# Patient Record
Sex: Female | Born: 1940 | Race: White | Hispanic: No | Marital: Single | Smoking: Never smoker
Health system: Southern US, Community
[De-identification: ages and names within clinical notes are randomized; demographics above are authoritative.]

## PROBLEM LIST (undated history)

## (undated) DIAGNOSIS — M1990 Unspecified osteoarthritis, unspecified site: Secondary | ICD-10-CM

## (undated) DIAGNOSIS — I2510 Atherosclerotic heart disease of native coronary artery without angina pectoris: Secondary | ICD-10-CM

## (undated) DIAGNOSIS — J302 Other seasonal allergic rhinitis: Secondary | ICD-10-CM

## (undated) DIAGNOSIS — I10 Essential (primary) hypertension: Secondary | ICD-10-CM

## (undated) DIAGNOSIS — R569 Unspecified convulsions: Secondary | ICD-10-CM

## (undated) DIAGNOSIS — K219 Gastro-esophageal reflux disease without esophagitis: Secondary | ICD-10-CM

## (undated) DIAGNOSIS — A491 Streptococcal infection, unspecified site: Secondary | ICD-10-CM

## (undated) DIAGNOSIS — Z8719 Personal history of other diseases of the digestive system: Secondary | ICD-10-CM

## (undated) DIAGNOSIS — F79 Unspecified intellectual disabilities: Secondary | ICD-10-CM

## (undated) HISTORY — DX: Other seasonal allergic rhinitis: J30.2

## (undated) HISTORY — DX: Streptococcal infection, unspecified site: A49.1

## (undated) HISTORY — DX: Personal history of other diseases of the digestive system: Z87.19

## (undated) HISTORY — PX: CHOLECYSTECTOMY: SHX55

---

## 2004-08-03 ENCOUNTER — Emergency Department: Payer: Self-pay | Admitting: Emergency Medicine

## 2004-08-03 ENCOUNTER — Other Ambulatory Visit: Payer: Self-pay

## 2005-05-21 ENCOUNTER — Emergency Department: Payer: Self-pay | Admitting: Emergency Medicine

## 2005-05-21 ENCOUNTER — Other Ambulatory Visit: Payer: Self-pay

## 2005-08-15 ENCOUNTER — Other Ambulatory Visit: Payer: Self-pay

## 2005-08-15 ENCOUNTER — Emergency Department: Payer: Self-pay | Admitting: Emergency Medicine

## 2006-09-19 ENCOUNTER — Emergency Department: Payer: Self-pay | Admitting: General Practice

## 2007-01-16 ENCOUNTER — Ambulatory Visit: Payer: Self-pay | Admitting: Gastroenterology

## 2008-06-14 ENCOUNTER — Inpatient Hospital Stay: Payer: Self-pay | Admitting: Internal Medicine

## 2008-06-14 ENCOUNTER — Other Ambulatory Visit: Payer: Self-pay

## 2008-06-15 ENCOUNTER — Other Ambulatory Visit: Payer: Self-pay

## 2009-01-17 ENCOUNTER — Emergency Department: Payer: Self-pay | Admitting: Emergency Medicine

## 2009-03-11 ENCOUNTER — Ambulatory Visit: Payer: Self-pay | Admitting: Internal Medicine

## 2009-03-25 ENCOUNTER — Encounter: Payer: Self-pay | Admitting: Internal Medicine

## 2009-04-17 ENCOUNTER — Encounter: Payer: Self-pay | Admitting: Internal Medicine

## 2009-05-18 ENCOUNTER — Encounter: Payer: Self-pay | Admitting: Internal Medicine

## 2013-01-23 ENCOUNTER — Emergency Department: Payer: Self-pay | Admitting: Unknown Physician Specialty

## 2013-01-23 LAB — BASIC METABOLIC PANEL
Anion Gap: 5 — ABNORMAL LOW (ref 7–16)
BUN: 19 mg/dL — ABNORMAL HIGH (ref 7–18)
Chloride: 100 mmol/L (ref 98–107)
Creatinine: 0.71 mg/dL (ref 0.60–1.30)
EGFR (African American): >60

## 2013-01-23 LAB — CBC WITH DIFFERENTIAL/PLATELET
Basophil #: 0.1 10*3/uL (ref 0.0–0.1)
Basophil %: 0.8 %
Eosinophil #: 0.1 10*3/uL (ref 0.0–0.7)
Eosinophil %: 1.6 %
HGB: 12.5 g/dL (ref 12.0–16.0)
Lymphocyte #: 2.5 10*3/uL (ref 1.0–3.6)
Lymphocyte %: 31.8 %
MCV: 88 fL (ref 80–100)
Monocyte %: 8.2 %
Platelet: 212 10*3/uL (ref 150–440)
RBC: 4.23 10*6/uL (ref 3.80–5.20)
RDW: 14.6 % — ABNORMAL HIGH (ref 11.5–14.5)

## 2013-01-23 LAB — PHENYTOIN LEVEL, TOTAL: Dilantin: 21.3 ug/mL — ABNORMAL HIGH (ref 10.0–20.0)

## 2013-01-23 LAB — HEPATIC FUNCTION PANEL A (ARMC)
Bilirubin, Direct: <0.1 mg/dL (ref 0.00–0.20)
SGOT(AST): 16 U/L (ref 15–37)

## 2013-01-23 LAB — PHENOBARBITAL LEVEL: Phenobarbital: 15.1 ug/mL (ref 15.0–40.0)

## 2013-07-30 ENCOUNTER — Emergency Department: Payer: Self-pay | Admitting: Emergency Medicine

## 2013-07-30 LAB — CBC WITH DIFFERENTIAL/PLATELET
Basophil #: 0 10*3/uL (ref 0.0–0.1)
HCT: 37.1 % (ref 35.0–47.0)
Lymphocyte #: 1.9 10*3/uL (ref 1.0–3.6)
MCH: 30.3 pg (ref 26.0–34.0)
MCHC: 33.7 g/dL (ref 32.0–36.0)
Neutrophil #: 4 10*3/uL (ref 1.4–6.5)
Neutrophil %: 61.3 %
WBC: 6.6 10*3/uL (ref 3.6–11.0)

## 2013-07-30 LAB — PHENYTOIN LEVEL, TOTAL: Dilantin: 16.7 ug/mL (ref 10.0–20.0)

## 2013-07-30 LAB — BASIC METABOLIC PANEL
Calcium, Total: 8.8 mg/dL (ref 8.5–10.1)
Chloride: 101 mmol/L (ref 98–107)
Creatinine: 0.65 mg/dL (ref 0.60–1.30)
EGFR (Non-African Amer.): >60
Glucose: 87 mg/dL (ref 65–99)
Potassium: 3.7 mmol/L (ref 3.5–5.1)

## 2014-06-06 DIAGNOSIS — D510 Vitamin B12 deficiency anemia due to intrinsic factor deficiency: Secondary | ICD-10-CM | POA: Insufficient documentation

## 2014-06-06 DIAGNOSIS — R609 Edema, unspecified: Secondary | ICD-10-CM | POA: Insufficient documentation

## 2014-06-06 DIAGNOSIS — E785 Hyperlipidemia, unspecified: Secondary | ICD-10-CM | POA: Insufficient documentation

## 2014-06-24 DIAGNOSIS — R9431 Abnormal electrocardiogram [ECG] [EKG]: Secondary | ICD-10-CM | POA: Insufficient documentation

## 2014-07-13 ENCOUNTER — Emergency Department: Payer: Self-pay | Admitting: Emergency Medicine

## 2014-07-13 LAB — CBC
HCT: 39.6 % (ref 35.0–47.0)
HGB: 12.4 g/dL (ref 12.0–16.0)
MCH: 28.3 pg (ref 26.0–34.0)
MCHC: 31.2 g/dL — ABNORMAL LOW (ref 32.0–36.0)
MCV: 91 fL (ref 80–100)
PLATELETS: 196 10*3/uL (ref 150–440)
RBC: 4.37 10*6/uL (ref 3.80–5.20)
RDW: 14 % (ref 11.5–14.5)
WBC: 15.1 10*3/uL — AB (ref 3.6–11.0)

## 2014-07-13 LAB — URINALYSIS, COMPLETE
Blood: NEGATIVE
Glucose,UR: NEGATIVE mg/dL (ref 0–75)
Ketone: NEGATIVE
NITRITE: NEGATIVE
Ph: 5 (ref 4.5–8.0)
Protein: NEGATIVE
RBC,UR: 1 /HPF (ref 0–5)
SPECIFIC GRAVITY: 1.018 (ref 1.003–1.030)
Squamous Epithelial: 2
WBC UR: 25 /HPF (ref 0–5)

## 2014-07-13 LAB — BASIC METABOLIC PANEL
ANION GAP: 9 (ref 7–16)
BUN: 23 mg/dL — AB (ref 7–18)
CREATININE: 1.17 mg/dL (ref 0.60–1.30)
Calcium, Total: 8.2 mg/dL — ABNORMAL LOW (ref 8.5–10.1)
Chloride: 102 mmol/L (ref 98–107)
Co2: 30 mmol/L (ref 21–32)
EGFR (African American): 59 — ABNORMAL LOW
EGFR (Non-African Amer.): 48 — ABNORMAL LOW
GLUCOSE: 127 mg/dL — AB (ref 65–99)
Osmolality: 287 (ref 275–301)
POTASSIUM: 3.6 mmol/L (ref 3.5–5.1)
Sodium: 141 mmol/L (ref 136–145)

## 2015-05-06 ENCOUNTER — Emergency Department: Payer: Medicare Other

## 2015-05-06 ENCOUNTER — Other Ambulatory Visit: Payer: Self-pay

## 2015-05-06 ENCOUNTER — Encounter: Payer: Self-pay | Admitting: Internal Medicine

## 2015-05-06 ENCOUNTER — Inpatient Hospital Stay
Admission: EM | Admit: 2015-05-06 | Discharge: 2015-05-09 | DRG: 193 | Disposition: A | Discharge status: Skilled Nursing Facility | Payer: Medicare Other | Attending: Internal Medicine | Admitting: Internal Medicine

## 2015-05-06 DIAGNOSIS — J9601 Acute respiratory failure with hypoxia: Secondary | ICD-10-CM | POA: Diagnosis present

## 2015-05-06 DIAGNOSIS — Z791 Long term (current) use of non-steroidal anti-inflammatories (NSAID): Secondary | ICD-10-CM | POA: Diagnosis not present

## 2015-05-06 DIAGNOSIS — Z823 Family history of stroke: Secondary | ICD-10-CM

## 2015-05-06 DIAGNOSIS — I2510 Atherosclerotic heart disease of native coronary artery without angina pectoris: Secondary | ICD-10-CM | POA: Diagnosis present

## 2015-05-06 DIAGNOSIS — I10 Essential (primary) hypertension: Secondary | ICD-10-CM | POA: Diagnosis present

## 2015-05-06 DIAGNOSIS — J189 Pneumonia, unspecified organism: Secondary | ICD-10-CM

## 2015-05-06 DIAGNOSIS — Z888 Allergy status to other drugs, medicaments and biological substances status: Secondary | ICD-10-CM | POA: Diagnosis not present

## 2015-05-06 DIAGNOSIS — Z833 Family history of diabetes mellitus: Secondary | ICD-10-CM | POA: Diagnosis not present

## 2015-05-06 DIAGNOSIS — E876 Hypokalemia: Secondary | ICD-10-CM | POA: Diagnosis not present

## 2015-05-06 DIAGNOSIS — K219 Gastro-esophageal reflux disease without esophagitis: Secondary | ICD-10-CM | POA: Diagnosis present

## 2015-05-06 DIAGNOSIS — F79 Unspecified intellectual disabilities: Secondary | ICD-10-CM | POA: Diagnosis present

## 2015-05-06 DIAGNOSIS — Z8249 Family history of ischemic heart disease and other diseases of the circulatory system: Secondary | ICD-10-CM | POA: Diagnosis not present

## 2015-05-06 DIAGNOSIS — Z9049 Acquired absence of other specified parts of digestive tract: Secondary | ICD-10-CM | POA: Diagnosis present

## 2015-05-06 DIAGNOSIS — M1990 Unspecified osteoarthritis, unspecified site: Secondary | ICD-10-CM | POA: Diagnosis present

## 2015-05-06 DIAGNOSIS — Z7982 Long term (current) use of aspirin: Secondary | ICD-10-CM | POA: Diagnosis not present

## 2015-05-06 DIAGNOSIS — I5041 Acute combined systolic (congestive) and diastolic (congestive) heart failure: Secondary | ICD-10-CM | POA: Diagnosis present

## 2015-05-06 DIAGNOSIS — G40909 Epilepsy, unspecified, not intractable, without status epilepticus: Secondary | ICD-10-CM | POA: Diagnosis present

## 2015-05-06 DIAGNOSIS — R0902 Hypoxemia: Secondary | ICD-10-CM

## 2015-05-06 DIAGNOSIS — Y95 Nosocomial condition: Secondary | ICD-10-CM | POA: Diagnosis present

## 2015-05-06 DIAGNOSIS — I340 Nonrheumatic mitral (valve) insufficiency: Secondary | ICD-10-CM | POA: Diagnosis not present

## 2015-05-06 HISTORY — DX: Atherosclerotic heart disease of native coronary artery without angina pectoris: I25.10

## 2015-05-06 HISTORY — DX: Unspecified convulsions: R56.9

## 2015-05-06 HISTORY — DX: Unspecified osteoarthritis, unspecified site: M19.90

## 2015-05-06 HISTORY — DX: Unspecified intellectual disabilities: F79

## 2015-05-06 HISTORY — DX: Essential (primary) hypertension: I10

## 2015-05-06 HISTORY — DX: Gastro-esophageal reflux disease without esophagitis: K21.9

## 2015-05-06 LAB — COMPREHENSIVE METABOLIC PANEL
ALBUMIN: 2.5 g/dL — AB (ref 3.5–5.0)
ALK PHOS: 132 U/L — AB (ref 38–126)
ALT: 12 U/L — AB (ref 14–54)
ANION GAP: 9 (ref 5–15)
AST: 25 U/L (ref 15–41)
BUN: 35 mg/dL — AB (ref 6–20)
CALCIUM: 8.5 mg/dL — AB (ref 8.9–10.3)
CO2: 28 mmol/L (ref 22–32)
Chloride: 97 mmol/L — ABNORMAL LOW (ref 101–111)
Creatinine, Ser: 1.09 mg/dL — ABNORMAL HIGH (ref 0.44–1.00)
GFR calc Af Amer: 57 mL/min — ABNORMAL LOW (ref >60)
GFR calc non Af Amer: 49 mL/min — ABNORMAL LOW (ref >60)
GLUCOSE: 130 mg/dL — AB (ref 65–99)
Potassium: 3.4 mmol/L — ABNORMAL LOW (ref 3.5–5.1)
SODIUM: 134 mmol/L — AB (ref 135–145)
Total Bilirubin: 0.2 mg/dL — ABNORMAL LOW (ref 0.3–1.2)
Total Protein: 5.7 g/dL — ABNORMAL LOW (ref 6.5–8.1)

## 2015-05-06 LAB — CBC
HEMATOCRIT: 32.2 % — AB (ref 35.0–47.0)
HEMOGLOBIN: 10.5 g/dL — AB (ref 12.0–16.0)
MCH: 28 pg (ref 26.0–34.0)
MCHC: 32.5 g/dL (ref 32.0–36.0)
MCV: 86.2 fL (ref 80.0–100.0)
Platelets: 214 10*3/uL (ref 150–440)
RBC: 3.73 MIL/uL — AB (ref 3.80–5.20)
RDW: 15.2 % — ABNORMAL HIGH (ref 11.5–14.5)
WBC: 19 10*3/uL — AB (ref 3.6–11.0)

## 2015-05-06 MED ORDER — PHENYTOIN SODIUM EXTENDED 30 MG PO CAPS: 260.0000 mg | ORAL_CAPSULE | Freq: Every day | ORAL | Status: DC

## 2015-05-06 MED ORDER — PANTOPRAZOLE SODIUM 40 MG PO TBEC
40.0000 mg | DELAYED_RELEASE_TABLET | Freq: Every day | ORAL | Status: DC
  Administered 2015-05-07 – 2015-05-09 (×3): 40 mg via ORAL
  Filled 2015-05-06 (×3): qty 1

## 2015-05-06 MED ORDER — SODIUM CHLORIDE 0.9 % IV SOLN
1000.0000 mL | Freq: Once | INTRAVENOUS | Status: AC
  Administered 2015-05-06: 1000 mL via INTRAVENOUS

## 2015-05-06 MED ORDER — ASPIRIN EC 81 MG PO TBEC
81.0000 mg | DELAYED_RELEASE_TABLET | Freq: Every day | ORAL | Status: DC
  Administered 2015-05-07 – 2015-05-09 (×3): 81 mg via ORAL
  Filled 2015-05-06 (×3): qty 1

## 2015-05-06 MED ORDER — ACETAMINOPHEN 325 MG PO TABS: 650.0000 mg | ORAL_TABLET | Freq: Four times a day (QID) | ORAL | Status: DC | PRN

## 2015-05-06 MED ORDER — VANCOMYCIN HCL IN DEXTROSE 1-5 GM/200ML-% IV SOLN
1000.0000 mg | INTRAVENOUS | Status: DC
  Administered 2015-05-07 – 2015-05-08 (×2): 1000 mg via INTRAVENOUS
  Filled 2015-05-06 (×4): qty 200

## 2015-05-06 MED ORDER — ONDANSETRON HCL 4 MG/2ML IJ SOLN: 4.0000 mg | Freq: Four times a day (QID) | INTRAMUSCULAR | Status: DC | PRN

## 2015-05-06 MED ORDER — PHENOBARBITAL 20 MG/5ML PO ELIX
60.0000 mg | ORAL_SOLUTION | Freq: Every day | ORAL | Status: DC
  Administered 2015-05-07 (×2): 60 mg via ORAL
  Filled 2015-05-06 (×6): qty 15

## 2015-05-06 MED ORDER — ONDANSETRON HCL 4 MG PO TABS: 4.0000 mg | ORAL_TABLET | Freq: Four times a day (QID) | ORAL | Status: DC | PRN

## 2015-05-06 MED ORDER — TRAMADOL HCL 50 MG PO TABS
50.0000 mg | ORAL_TABLET | Freq: Four times a day (QID) | ORAL | Status: DC | PRN
  Administered 2015-05-08 – 2015-05-09 (×3): 50 mg via ORAL
  Filled 2015-05-06 (×3): qty 1

## 2015-05-06 MED ORDER — LEVOFLOXACIN IN D5W 750 MG/150ML IV SOLN
750.0000 mg | INTRAVENOUS | Status: DC
  Filled 2015-05-06: qty 150

## 2015-05-06 MED ORDER — ACETAMINOPHEN 650 MG RE SUPP: 650.0000 mg | Freq: Four times a day (QID) | RECTAL | Status: DC | PRN

## 2015-05-06 MED ORDER — VANCOMYCIN HCL IN DEXTROSE 1-5 GM/200ML-% IV SOLN
1000.0000 mg | Freq: Once | INTRAVENOUS | Status: AC
  Administered 2015-05-07: 1000 mg via INTRAVENOUS
  Filled 2015-05-06: qty 200

## 2015-05-06 MED ORDER — SODIUM CHLORIDE 0.9 % IJ SOLN
3.0000 mL | Freq: Two times a day (BID) | INTRAMUSCULAR | Status: DC
  Administered 2015-05-07 – 2015-05-08 (×3): 3 mL via INTRAVENOUS

## 2015-05-06 MED ORDER — ENOXAPARIN SODIUM 40 MG/0.4ML ~~LOC~~ SOLN
40.0000 mg | Freq: Every day | SUBCUTANEOUS | Status: DC
  Administered 2015-05-07 – 2015-05-09 (×3): 40 mg via SUBCUTANEOUS
  Filled 2015-05-06 (×3): qty 0.4

## 2015-05-06 MED ORDER — LEVOFLOXACIN IN D5W 750 MG/150ML IV SOLN
750.0000 mg | Freq: Once | INTRAVENOUS | Status: AC
  Administered 2015-05-06: 750 mg via INTRAVENOUS
  Filled 2015-05-06: qty 150

## 2015-05-06 MED ORDER — SODIUM CHLORIDE 0.9 % IV SOLN
INTRAVENOUS | Status: DC
  Administered 2015-05-07: 01:00:00 via INTRAVENOUS

## 2015-05-06 MED ORDER — GABAPENTIN 600 MG PO TABS
600.0000 mg | ORAL_TABLET | Freq: Four times a day (QID) | ORAL | Status: DC
  Administered 2015-05-07 – 2015-05-09 (×10): 600 mg via ORAL
  Filled 2015-05-06 (×10): qty 1

## 2015-05-06 NOTE — ED Provider Notes (Signed)
[facility] Emergency Department Provider Note  ____________________________________________  Time seen: 9 PM  I have reviewed the triage vital signs and the nursing notes.   HISTORY  Chief Complaint Weakness    HPI Hannah Vaughan is a 74 y.o. female who was apparently diagnosed with pneumonia at her primary care physician today she was started on by mouth Levaquin. However nurse who checked on her at her group home noted her to be hypoxic and referred her to the emergency department. Patient is mute hence history is somewhat limited. But apparently she has been coughing. No fevers are reported.     No past medical history on file.  There are no active problems to display for this patient.   No past surgical history on file.  No current outpatient prescriptions on file.  Allergies Depakote  No family history on file.  Social History No smoking, no alcohol  Review of Systems  Constitutional: Negative for fever.   Respiratory: Positive for shortness of breath, negative for cough Gastrointestinal: Negative for vomiting and diarrhea vomiting and diarrhea.  Skin: Negative for rash. Neurological: Negative for focal weakness   Level V caveat ROS limited secondary to nonverbal and is provided mostly by caregivers  ____________________________________________   PHYSICAL EXAM:  VITAL SIGNS: ED Triage Vitals  Enc Vitals Group     BP 05/06/15 1930 112/74 mmHg     Pulse Rate 05/06/15 1930 78     Resp 05/06/15 1930 16     Temp 05/06/15 1930 98.2 F (36.8 C)     Temp Source 05/06/15 1930 Oral     SpO2 05/06/15 1930 89 %     Weight 05/06/15 1930 145 lb (65.772 kg)     Height 05/06/15 1930  (1.499 m)     Head Cir --      Peak Flow --      Pain Score --      Pain Loc --      Pain Edu? --      Excl. in GC? --      Constitutional: No acute distress Eyes: Conjunctivae are normal.  ENT   Head: Normocephalic and atraumatic.    Mouth/Throat: Mucous membranes are moist. Cardiovascular: Normal rate, regular rhythm. Normal and symmetric distal pulses are present in all extremities. No murmurs, rubs, or gallops. Respiratory: Mild tachypnea. Crackles bibasilarly Gastrointestinal: Soft and non-tender in all quadrants. No distention. There is no CVA tenderness. Genitourinary: deferred Musculoskeletal: Nontender with normal range of motion in all extremities. 1+ edema bilaterally Neurologic:  No gross focal neurologic deficits are appreciated. Skin:  Skin is warm, dry and intact. No rash noted.   ____________________________________________    LABS (pertinent positives/negatives)  Labs Reviewed  CBC - Abnormal; Notable for the following:    WBC 19.0 (*)    RBC 3.73 (*)    Hemoglobin 10.5 (*)    HCT 32.2 (*)    RDW 15.2 (*)    All other components within normal limits  COMPREHENSIVE METABOLIC PANEL - Abnormal; Notable for the following:    Sodium 134 (*)    Potassium 3.4 (*)    Chloride 97 (*)    Glucose, Bld 130 (*)    BUN 35 (*)    Creatinine, Ser 1.09 (*)    Calcium 8.5 (*)    Total Protein 5.7 (*)    Albumin 2.5 (*)    ALT 12 (*)    Alkaline Phosphatase 132 (*)    Total Bilirubin  0.2 (*)    GFR calc non Af Amer 49 (*)    GFR calc Af Amer 57 (*)    All other components within normal limits  CULTURE, BLOOD (ROUTINE X 2)  CULTURE, BLOOD (ROUTINE X 2)  URINALYSIS COMPLETEWITH MICROSCOPIC ([facility] ONLY)  BRAIN NATRIURETIC PEPTIDE  CBG MONITORING, ED    ____________________________________________   EKG  ED ECG REPORT I, Jene Every, the attending physician, personally viewed and interpreted this ECG.  Date: 05/06/2015 EKG Time: 7:44 PM Rate: 71 Rhythm: normal sinus rhythm QRS Axis: normal Intervals: normal ST/T Wave abnormalities: normal Conduction Disutrbances: none Narrative Interpretation: unremarkable   ____________________________________________    RADIOLOGY I have  personally reviewed any xrays that were ordered on this patient: Chest x-ray shows infiltrate versus edema bilaterally  ____________________________________________   PROCEDURES  Procedure(s) performed: none  Critical Care performed: none  ____________________________________________   INITIAL IMPRESSION / ASSESSMENT AND PLAN / ED COURSE  Pertinent labs & imaging results that were available during my care of the patient were reviewed by me and considered in my medical decision making (see chart for details).  Patient hypoxic with room air sat of 89%. Bilateral infiltrates and elevated white blood cell count so I will give her IV Levaquin for possible pneumonia and sent blood cultures. I will also add on a BNP given this may be edema. Will require admission for further workup  ____________________________________________   FINAL CLINICAL IMPRESSION(S) / ED DIAGNOSES  Final diagnoses:  Community acquired pneumonia     Jene Every, MD 05/06/15 2159

## 2015-05-06 NOTE — H&P (Signed)
[facility] - [place] at [facility]   PATIENT NAME: Hannah Vaughan    MR#:  [id number]  DATE OF BIRTH:  Mar 17, 1941  DATE OF ADMISSION:  05/06/2015  PRIMARY CARE PHYSICIAN: Hannah Vaughan., MD   REQUESTING/REFERRING PHYSICIAN: Cyril Vaughan, M.D.  CHIEF COMPLAINT:   Chief Complaint  Patient presents with  . Weakness    HISTORY OF PRESENT ILLNESS:  Hannah Vaughan  is a 74 y.o. female who presents with increased confusion, lethargy, cough. Patient is a resident of a local group home, and has been having decreased by mouth intake over the last couple of days. She's also been more steady on her feet, more lethargic. She was taken to her primary care physician for evaluation, and had a chest x-ray done which showed a likely pneumonia. She was given outpatient oral antibiotics, and told that if she got any worse that she should be taken to emergency department. Once back in a group home she began acting more lethargic, and according to her caregivers were present with her in the ED today, she was acting as if she might of been in pain. They brought her to the ED for evaluation, where she was found to be hypoxic to the high 80s. Evaluation here showed an elevated white count and repeat chest x-ray showed likely pneumonia versus congestive heart failure. Hospitalists were called for admission for pneumonia.  PAST MEDICAL HISTORY:   Past Medical History  Diagnosis Date  . HTN (hypertension)   . Seizure   . CAD (coronary artery disease)   . Osteoarthritis   . GERD (gastroesophageal reflux disease)     PAST SURGICAL HISTORY:   Past Surgical History  Procedure Laterality Date  . Cholecystectomy      SOCIAL HISTORY:   Social History  Substance Use Topics  . Smoking status: Never Smoker   . Smokeless tobacco: Not on file  . Alcohol Use: No    FAMILY HISTORY:   Family History  Problem Relation Age of Onset  . CAD Mother   . Congestive Heart Failure Mother   .  CVA Father   . Heart attack Brother   . Hypertension Sister   . Diabetes Sister     DRUG ALLERGIES:   Allergies  Allergen Reactions  . Depakote [Valproic Acid]   . Divalproex Sodium Other (See Comments)    MEDICATIONS AT HOME:   Prior to Admission medications   Medication Sig Start Date End Date Taking? Authorizing Provider  acetaminophen (TYLENOL) 325 MG tablet Take 1 tablet by mouth every 6 (six) hours.   Yes Historical Provider, MD  amLODipine (NORVASC) 2.5 MG tablet Take 1 tablet by mouth every morning.   Yes Historical Provider, MD  aspirin EC 81 MG tablet Take 1 tablet by mouth daily. 11/11/14  Yes Historical Provider, MD  calcium-vitamin D (CALCIUM 500/D) 500-200 MG-UNIT per tablet Take 1 tablet by mouth daily.   Yes Historical Provider, MD  cetirizine (ZYRTEC) 10 MG tablet Take 1 tablet by mouth daily as needed. 03/25/14  Yes Historical Provider, MD  cyanocobalamin (,VITAMIN B-12,) 1000 MCG/ML injection Inject 1 mL into the muscle every 30 (thirty) days.   Yes Historical Provider, MD  docusate sodium (STOOL SOFTENER) 100 MG capsule Take 1 capsule by mouth 2 (two) times daily.   Yes Historical Provider, MD  etodolac (LODINE) 400 MG tablet Take 1 tablet by mouth 2 (two) times daily.   Yes Historical Provider, MD  gabapentin (NEURONTIN) 600 MG tablet Take 1 tablet  by mouth 4 (four) times daily. 06/11/14  Yes Historical Provider, MD  hydrochlorothiazide (HYDRODIURIL) 25 MG tablet Take 1 tablet by mouth daily.   Yes Historical Provider, MD  hydrocortisone (ANUSOL-HC) 25 MG suppository Place 25 mg rectally 2 (two) times daily as needed for hemorrhoids or itching.   Yes Historical Provider, MD  isosorbide mononitrate (IMDUR) 60 MG 24 hr tablet Take 1 tablet by mouth daily. 02/11/14  Yes Historical Provider, MD  magnesium hydroxide (MILK OF MAGNESIA) 400 MG/5ML suspension Take 5 mLs by mouth as needed.   Yes Historical Provider, MD  metoprolol succinate (TOPROL-XL) 25 MG 24 hr tablet Take 1  tablet by mouth daily.   Yes Historical Provider, MD  nitroGLYCERIN (NITROSTAT) 0.4 MG SL tablet Place 1 tablet under the tongue as needed. 04/06/15  Yes Historical Provider, MD  omeprazole (PRILOSEC) 20 MG capsule Take 1 capsule by mouth daily. 01/06/15  Yes Historical Provider, MD  PHENObarbital 20 MG/5ML elixir Take 15 mLs by mouth at bedtime.   Yes Historical Provider, MD  phenytoin (DILANTIN) 100 MG ER capsule Take 260 mg by mouth daily.   Yes Historical Provider, MD  tolnaftate (TINACTIN) 1 % spray Apply 1 application topically 2 (two) times daily.   Yes Historical Provider, MD  traMADol (ULTRAM) 50 MG tablet Take 1 tablet by mouth every 6 (six) hours as needed. 10/04/14  Yes Historical Provider, MD  triamcinolone cream (KENALOG) 0.1 % Apply 1 application topically.   Yes Historical Provider, MD  levofloxacin (LEVAQUIN) 500 MG tablet Take 1 tablet by mouth daily. 05/06/15 05/13/15  Historical Provider, MD  predniSONE (DELTASONE) 20 MG tablet Take 1 tablet by mouth daily. 05/06/15   Historical Provider, MD    REVIEW OF SYSTEMS:  Review of Systems  Unable to perform ROS: mental acuity     VITAL SIGNS:   Filed Vitals:   05/06/15 1930 05/06/15 2003 05/06/15 2030  BP: 112/74 100/44 110/70  Pulse: 78 69   Temp: 98.2 F (36.8 C)    TempSrc: Oral    Resp: 16 16 15   Height: 4\' 11"  (1.499 m)    Weight: 65.772 kg (145 lb)    SpO2: 89% 95%    Wt Readings from Last 3 Encounters:  05/06/15 65.772 kg (145 lb)    PHYSICAL EXAMINATION:  Physical Exam  Vitals reviewed. Constitutional: She appears well-developed and well-nourished. No distress.  HENT:  Head: Normocephalic and atraumatic.  Mouth/Throat: Oropharynx is clear and moist.  Eyes: Conjunctivae are normal. Pupils are equal, round, and reactive to light. No scleral icterus.  Neck: Normal range of motion. Neck supple. No JVD present. No thyromegaly present.  Cardiovascular: Normal rate, regular rhythm and intact distal pulses.  Exam  reveals no gallop and no friction rub.   No murmur heard. Respiratory: Effort normal. No respiratory distress. She has no wheezes. She has no rales.  Mild left greater than right coarse breath sounds  GI: Soft. Bowel sounds are normal. She exhibits no distension. There is no tenderness.  Musculoskeletal: Normal range of motion. She exhibits no edema.  No arthritis, no gout  Lymphadenopathy:    She has no cervical adenopathy.  Neurological:  Unable to fully assess due to lethargy  Skin: Skin is warm and dry. No rash noted. No erythema.  Psychiatric:  Unable to fully assess due to medical condition    LABORATORY PANEL:   CBC  Recent Labs Lab 05/06/15 1948  WBC 19.0*  HGB 10.5*  HCT 32.2*  PLT 214   ------------------------------------------------------------------------------------------------------------------  Chemistries   Recent Labs Lab 05/06/15 1948  NA 134*  K 3.4*  CL 97*  CO2 28  GLUCOSE 130*  BUN 35*  CREATININE 1.09*  CALCIUM 8.5*  AST 25  ALT 12*  ALKPHOS 132*  BILITOT 0.2*   ------------------------------------------------------------------------------------------------------------------  Cardiac Enzymes No results for input(s): TROPONINI in the last 168 hours. ------------------------------------------------------------------------------------------------------------------  RADIOLOGY:  Dg Chest Portable 1 View  05/06/2015   CLINICAL DATA:  Hypoxia.  Pneumonia.  EXAM: PORTABLE CHEST - 1 VIEW  COMPARISON:  07/30/2013.  FINDINGS: Cardiopericardial silhouette is enlarged. Pulmonary vascular congestion is present with LEFT-greater-than- RIGHT perihilar and basilar predominant airspace disease. In the setting of cardiomegaly, this likely represents CHF however superimposed pneumonia cannot be excluded.  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   IMPRESSION: Cardiomegaly with bilateral LEFT-greater-than-RIGHT perihilar and basilar predominant airspace disease favored to represent CHF. Superimposed pneumonia not excluded.   Electronically Signed   By: Andreas Newport M.D.   On: 05/06/2015 21:23    EKG:   Orders placed or performed during the hospital encounter of 05/06/15  . ED EKG  . ED EKG    IMPRESSION AND PLAN:  Principal Problem:   Acute respiratory failure with hypoxemia - likely due to community-acquired pneumonia versus less likely CHF. Radiologist read chest x-ray is favoring CHF, however given her elevated white count and her clinical scenario, pneumonia is more likely. Her O2 sats improved with 2 L of oxygen via nasal cannula. We will check a BNP just to verify status of her heart failure. See treatment of other problems below. Active Problems:   HCAP (healthcare associated pneumonia) - given the patient lives in a group home, we will cover her broadly for healthcare associated pneumonia. She does not currently meet SIRS criteria, however her blood pressure is on the low side. We will try to get a sputum culture, send blood cultures, continue broad-spectrum antibiotics for now, given aggressive fluids for rehydration and blood pressure support, check a lactic acid.   HTN (hypertension) - hold home antihypertensives at this time as patient's blood pressure is low. This can be restarted when she improves clinically.   CAD (coronary artery disease) - continue home meds, except for those which would lower her blood pressure.   Osteoarthritis - home dose tramadol when necessary.   Seizure disorder - continue home antiepileptics   GERD (gastroesophageal reflux disease) - equivalent home dose PPI  All the records are reviewed and case discussed with ED provider. Management plans discussed with the patient and/or family.  DVT PROPHYLAXIS: SubQ lovenox  ADMISSION STATUS: Inpatient  CODE STATUS: Full  TOTAL TIME TAKING CARE OF  THIS PATIENT: 45 minutes.    Vartan Kerins FIELDING 05/06/2015, 11:02 PM  [facility]  Office  [phone]  CC: Primary care physician; Hannah Vaughan., MD

## 2015-05-06 NOTE — Progress Notes (Addendum)
ANTIBIOTIC CONSULT NOTE - INITIAL  Pharmacy Consult for levofloxacin/vancomycin Indication: pneumonia  Allergies  Allergen Reactions  . Depakote [Valproic Acid]   . Divalproex Sodium Other (See Comments)    Patient Measurements: Height:  (149.9 cm) Weight: 145 lb (65.772 kg) IBW/kg (Calculated) : 43.2 Adjusted Body Weight: 52.3 kg  Vital Signs: Temp: 98.2 F (36.8 C) (08/19 1930) Temp Source: Oral (08/19 1930) BP: 91/54 mmHg (08/19 2305) Pulse Rate: 61 (08/19 2305) Intake/Output from previous day:   Intake/Output from this shift:    Labs:  Recent Labs  05/06/15 1948  WBC 19.0*  HGB 10.5*  PLT 214  CREATININE 1.09*   Estimated Creatinine Clearance: 38.4 mL/min (by C-G formula based on Cr of 1.09). No results for input(s): VANCOTROUGH, VANCOPEAK, VANCORANDOM, GENTTROUGH, GENTPEAK, GENTRANDOM, TOBRATROUGH, TOBRAPEAK, TOBRARND, AMIKACINPEAK, AMIKACINTROU, AMIKACIN in the last 72 hours.   Microbiology: No results found for this or any previous visit (from the past 720 hour(s)).  Medical History: Past Medical History  Diagnosis Date  . HTN (hypertension)   . Seizure   . CAD (coronary artery disease)   . Osteoarthritis   . GERD (gastroesophageal reflux disease)   . Mental retardation     Medications:  Infusions:  . levofloxacin (LEVAQUIN) IV 750 mg (05/06/15 2254)   Assessment: 74 yof cc weakness with confusion/lethargy/cough. Resident of group home, PCP CXR showed likely PNA. Treating for HCAP.  Vd 46.4 L, Ke 0.0365 hr-1, T1/2 19 hr.   Goal of Therapy:  Vancomycin trough level 15-20 mcg/ml  Plan:  Expected duration 7 days with resolution of temperature and/or normalization of WBC Measure antibiotic drug levels at steady state Follow up culture results  Creatinine clearance approximately 40 mL/min, levofloxacin 750 mg IV Q48H and vancomycin 1 gm IV Q24H with stacked dosing protocol ordered. Will order trough before fourth dose and adjust as needed  to maintain trough 15 to 20 mcg/mL.    Carola Frost, Pharm.D.  Clinical Pharmacist 05/06/2015,11:31 PM

## 2015-05-06 NOTE — ED Notes (Signed)
Caregivers state pt with pneumonia confirmed by pmd caregivers states pt with weakness, "she's just off and she hasn't peed." pt with ra pox of 89% on ra. Pt has not started antibiotics.

## 2015-05-06 NOTE — ED Notes (Signed)
Report to brad, rn. Pt placed in room 19 placed on cardiac monitor and pox with oxygen at 2lpm via [place] infusing, ekg and int initiation started.

## 2015-05-07 LAB — BASIC METABOLIC PANEL
Anion gap: 5 (ref 5–15)
BUN: 33 mg/dL — AB (ref 6–20)
CHLORIDE: 102 mmol/L (ref 101–111)
CO2: 30 mmol/L (ref 22–32)
CREATININE: 0.96 mg/dL (ref 0.44–1.00)
Calcium: 8.2 mg/dL — ABNORMAL LOW (ref 8.9–10.3)
GFR calc Af Amer: >60 mL/min (ref >60)
GFR calc non Af Amer: 58 mL/min — ABNORMAL LOW (ref >60)
GLUCOSE: 134 mg/dL — AB (ref 65–99)
POTASSIUM: 3.1 mmol/L — AB (ref 3.5–5.1)
Sodium: 137 mmol/L (ref 135–145)

## 2015-05-07 LAB — LACTIC ACID, PLASMA: Lactic Acid, Venous: 1.6 mmol/L (ref 0.5–2.0)

## 2015-05-07 LAB — CBC
HEMATOCRIT: 29.8 % — AB (ref 35.0–47.0)
Hemoglobin: 9.5 g/dL — ABNORMAL LOW (ref 12.0–16.0)
MCH: 27.4 pg (ref 26.0–34.0)
MCHC: 32 g/dL (ref 32.0–36.0)
MCV: 85.7 fL (ref 80.0–100.0)
PLATELETS: 168 10*3/uL (ref 150–440)
RBC: 3.48 MIL/uL — ABNORMAL LOW (ref 3.80–5.20)
RDW: 15.2 % — AB (ref 11.5–14.5)
WBC: 13 10*3/uL — ABNORMAL HIGH (ref 3.6–11.0)

## 2015-05-07 LAB — BRAIN NATRIURETIC PEPTIDE: B Natriuretic Peptide: 303 pg/mL — ABNORMAL HIGH (ref 0.0–100.0)

## 2015-05-07 LAB — PHENYTOIN LEVEL, TOTAL: Phenytoin Lvl: 13.3 ug/mL (ref 10.0–20.0)

## 2015-05-07 MED ORDER — PHENOBARBITAL 20 MG/5ML PO ELIX
60.0000 mg | ORAL_SOLUTION | Freq: Every day | ORAL | Status: DC
  Administered 2015-05-07 – 2015-05-09 (×2): 60 mg via ORAL
  Filled 2015-05-07: qty 15

## 2015-05-07 MED ORDER — POTASSIUM CHLORIDE 20 MEQ/15ML (10%) PO SOLN
40.0000 meq | Freq: Once | ORAL | Status: AC
  Administered 2015-05-07: 40 meq via ORAL
  Filled 2015-05-07: qty 30

## 2015-05-07 MED ORDER — POTASSIUM CHLORIDE 10 MEQ/100ML IV SOLN
10.0000 meq | INTRAVENOUS | Status: AC
  Administered 2015-05-07 (×4): 10 meq via INTRAVENOUS
  Filled 2015-05-07 (×4): qty 100

## 2015-05-07 MED ORDER — PHENYTOIN SODIUM EXTENDED 30 MG PO CAPS
260.0000 mg | ORAL_CAPSULE | Freq: Every day | ORAL | Status: DC
  Administered 2015-05-07 – 2015-05-09 (×2): 260 mg via ORAL
  Filled 2015-05-07 (×3): qty 2

## 2015-05-07 MED ORDER — SODIUM CHLORIDE 0.9 % IV BOLUS (SEPSIS): 1000.0000 mL | INTRAVENOUS | Status: DC | PRN

## 2015-05-07 MED ORDER — PHENOBARBITAL 60 MG/ML ORAL SUSPENSION: 60.0000 mg | Freq: Every day | ORAL | Status: DC

## 2015-05-07 NOTE — Progress Notes (Signed)
[facility] - [place] at [facility]   PATIENT NAME: Hannah Vaughan    MR#:  [id number]  DATE OF BIRTH:  1941-05-22  SUBJECTIVE:  Patient cannot answer questions dur to MR  REVIEW OF SYSTEMS:    Review of Systems  Unable to perform ROS   Tolerating Diet:pureed      DRUG ALLERGIES:   Allergies  Allergen Reactions  . Depakote [Valproic Acid]   . Divalproex Sodium Other (See Comments)    VITALS:  Blood pressure 122/61, pulse 78, temperature 98.4 F (36.9 C), temperature source Oral, resp. rate 18, height 4\' 11"  (1.499 m), weight 66.316 kg (146 lb 3.2 oz), SpO2 97 %.  PHYSICAL EXAMINATION:   Physical Exam  Constitutional: She is well-developed, well-nourished, and in no distress. No distress.  HENT:  Head: Normocephalic.  Eyes: No scleral icterus.  Neck: Normal range of motion. Neck supple. No JVD present. No tracheal deviation present.  Cardiovascular: Normal rate, regular rhythm and normal heart sounds.  Exam reveals no gallop and no friction rub.   No murmur heard. Pulmonary/Chest: Effort normal and breath sounds normal. No respiratory distress. She has no wheezes. She has no rales. She exhibits no tenderness.  Abdominal: Soft. Bowel sounds are normal. She exhibits no distension and no mass. There is no tenderness. There is no rebound and no guarding.  Musculoskeletal: Normal range of motion. She exhibits no edema.  Patient is able to spontaneously move her extremities  Neurological: She is alert.  Her tongue is protruding out  Skin: Skin is warm. No rash noted. No erythema.      LABORATORY PANEL:   CBC  Recent Labs Lab 05/07/15 0131  WBC 13.0*  HGB 9.5*  HCT 29.8*  PLT 168   ------------------------------------------------------------------------------------------------------------------  Chemistries   Recent Labs Lab 05/06/15 1948 05/07/15 0131  NA 134* 137  K 3.4* 3.1*  CL 97* 102  CO2 28 30  GLUCOSE 130* 134*  BUN  35* 33*  CREATININE 1.09* 0.96  CALCIUM 8.5* 8.2*  AST 25  --   ALT 12*  --   ALKPHOS 132*  --   BILITOT 0.2*  --    ------------------------------------------------------------------------------------------------------------------  Cardiac Enzymes No results for input(s): TROPONINI in the last 168 hours. ------------------------------------------------------------------------------------------------------------------  RADIOLOGY:  Dg Chest Portable 1 View  05/06/2015   CLINICAL DATA:    IMPRESSION: Cardiomegaly with bilateral LEFT-greater-than-RIGHT perihilar and basilar predominant airspace disease favored to represent CHF. Superimposed pneumonia not excluded.   Electronically Signed   By: Andreas Newport M.D.   On: 05/06/2015 21:23     ASSESSMENT AND PLAN:   74 year old female with a history of mental retardation who is a resident of a group home who presented with increasing confusion, lethargy and cough had to have a pneumonia.  1. Healthcare acquired pneumonia: Patient is from a group home and therefore needs broader spectrum antibiotics. Patient is currently on Levaquin and vancomycin which I'll continue. I will follow up on blood cultures.  2. Acute respiratory failure with hypoxemia: This is secondary to problem #1.  3. Essential hypertension: Patient's blood pressure ordered to be systolic 58 this morning, however I do not know if this is accurate. Her blood pressure now systolic is 122. Continue to hold hypertensive medications and will continue IV fluids and monitor blood pressure.  4. History of CAD: I will continue aspirin.  5. Hypokalemia: Patient received potassium supplementation. BMP is ordered for a.m.  6. Seizure disorder: I will check phenytoin level.  Pharmacy is assisting with dosage. Continue phenytoin and phenobarbital for now.    Management plans discussed with the nurse.  CODE STATUS: Full  TOTAL TIME TAKING CARE OF THIS PATIENT: 33 minutes.      POSSIBLE D/C tomorrow, DEPENDING ON CLINICAL CONDITION.   Nicoles Sedlacek M.D on 05/07/2015 at 11:26 AM  Between 7am to 6pm - Pager - [phone] After 6pm go to [email or web address] - password [email or web address] [facility]  [place] [facility]  Office  [phone]  CC: Primary care physician; Lauro Regulus., MD

## 2015-05-07 NOTE — Progress Notes (Signed)
MEDICATION RELATED CONSULT NOTE - INITIAL   Pharmacy Consult for Phenytoin  Allergies  Allergen Reactions  . Depakote [Valproic Acid]   . Divalproex Sodium Other (See Comments)    Patient Measurements: Height:  (149.9 cm) Weight: 146 lb 3.2 oz (66.316 kg) IBW/kg (Calculated) : 43.2   Vital Signs: Temp: 98.4 F (36.9 C) (08/20 0739) Temp Source: Oral (08/20 0739) BP: 122/61 mmHg (08/20 1112) Pulse Rate: 78 (08/20 1112) Intake/Output from previous day: 08/19 0701 - 08/20 0700 In: 300 [IV Piggyback:300] Out: 0  Intake/Output from this shift: Total I/O In: 240 [P.O.:240] Out: -   Labs:  Recent Labs  05/06/15 1948 05/07/15 0131  WBC 19.0* 13.0*  HGB 10.5* 9.5*  HCT 32.2* 29.8*  PLT 214 168  CREATININE 1.09* 0.96  ALBUMIN 2.5*  --   PROT 5.7*  --   AST 25  --   ALT 12*  --   ALKPHOS 132*  --   BILITOT 0.2*  --    Estimated Creatinine Clearance: 43.8 mL/min (by C-G formula based on Cr of 0.96).   Microbiology: Recent Results (from the past 720 hour(s))  Blood culture (routine x 2)     Status: None (Preliminary result)   Collection Time: 05/06/15  8:30 AM  Result Value Ref Range Status   Specimen Description BLOOD RIGHT HAND  Final   Special Requests BOTTLES DRAWN AEROBIC AND ANAEROBIC 5CC  Final   Culture NO GROWTH < 12 HOURS  Final   Report Status PENDING  Incomplete  Blood culture (routine x 2)     Status: None (Preliminary result)   Collection Time: 05/06/15  7:48 PM  Result Value Ref Range Status   Specimen Description BLOOD LEFT ARM  Final   Special Requests BOTTLES DRAWN AEROBIC AND ANAEROBIC 5CC  Final   Culture NO GROWTH < 12 HOURS  Final   Report Status PENDING  Incomplete    Medical History: Past Medical History  Diagnosis Date  . HTN (hypertension)   . Seizure   . CAD (coronary artery disease)   . Osteoarthritis   . GERD (gastroesophageal reflux disease)   . Mental retardation       Assessment: 74 yo female to continue on  phenytoin from home. Conflicting documentation of PTA phenytoin dose.  Per paper med list from [facility], dose listed as phenytoin 260 mg PO qHS. Per paper med list from MD visit and H&P in chart dose listed as phenytoin 300 mg PO BID.  Doses in paper documentation also verified by RN [place]. No pharmacy information available at this time.   Per RN, clarified dose with a person from [facility] group home, who stated current dose is phenytoin 260 mg (100 mg x2 capsules + 30 mg x2 capsules) PO qhs.   Random phenytoin level = 13.3, Albumin (8/19) 2.5.  Corrected phenytoin level 22.2  Goal of Therapy:  Total phenytoin level 10-20   Plan:  Will continue phenytoin 260 mg PO qhs and check free phenytoin level to confirm If pt still here, can recheck phenytoin level in 5 days.   Pharmacy will continue to follow.    Crist Fat L 05/07/2015,12:50 PM

## 2015-05-07 NOTE — Evaluation (Signed)
Clinical/Bedside Swallow Evaluation Patient Details  Name: Hannah Vaughan MRN: [id number] Date of Birth: July 16, 1941  Today's Date: 05/07/2015 Time: SLP Start Time (ACUTE ONLY): 1015 SLP Stop Time (ACUTE ONLY): 1040 SLP Time Calculation (min) (ACUTE ONLY): 25 min  Past Medical History:  Past Medical History  Diagnosis Date  . HTN (hypertension)   . Seizure   . CAD (coronary artery disease)   . Osteoarthritis   . GERD (gastroesophageal reflux disease)   . Mental retardation    Past Surgical History:  Past Surgical History  Procedure Laterality Date  . 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 pt with throat clear with all trials of intake, in which caregiver states is her baseline. pt with no coughing or throat clear with nectar thick liquids. Caregiver states she has been on puree diet for many years, st downgraded to puree here.    Aspiration Risk  Moderate    Diet Recommendation Dysphagia 1 (Puree);Nectar   Medication Administration: Crushed with puree Compensations: Slow rate;Small sips/bites;Follow solids with liquid    Other  Recommendations Oral Care Recommendations: Oral care BID Other Recommendations: Order thickener from pharmacy   Follow Up Recommendations       Frequency and Duration min 3x week  1 week   Pertinent Vitals/Pain No pain reported    SLP Swallow Goals     Swallow Study Prior Functional Status       General Date of Onset: 05/06/15 Type of Study: Bedside swallow evaluation Diet Prior to this Study: Regular;Thin liquids Temperature Spikes Noted: N/A Respiratory Status: Supplemental O2 delivered via (comment) History of Recent Intubation: No Behavior/Cognition: Alert;Cooperative;Pleasant mood Oral Cavity - Dentition: Poor condition;Missing dentition Self-Feeding Abilities:  Needs assist;Able to feed self Patient Positioning: Upright in bed    Oral/Motor/Sensory Function Overall Oral Motor/Sensory Function: Impaired Lingual ROM: Other (Comment) (pt with large tongue with lingual pertrusion) Lingual Strength: Reduced   Ice Chips Ice chips: Not tested   Thin Liquid Thin Liquid: Impaired Pharyngeal  Phase Impairments: Throat Clearing - Delayed;Cough - Immediate;Throat Clearing - Immediate    Nectar Thick Nectar Thick Liquid: Within functional limits   Honey Thick Honey Thick Liquid: Not tested   Puree Puree: Within functional limits   Solid   GO    Solid: Not tested       Meredith Pel Sauber 05/07/2015,12:14 PM

## 2015-05-07 NOTE — Progress Notes (Signed)
Spoke with Dr. Anne Hahn about pt potassium and BNP level. Md to place orders.

## 2015-05-08 ENCOUNTER — Inpatient Hospital Stay: Payer: Medicare Other

## 2015-05-08 ENCOUNTER — Encounter: Payer: Self-pay | Admitting: Radiology

## 2015-05-08 LAB — BASIC METABOLIC PANEL
Anion gap: 5 (ref 5–15)
BUN: 15 mg/dL (ref 6–20)
CHLORIDE: 107 mmol/L (ref 101–111)
CO2: 30 mmol/L (ref 22–32)
Calcium: 8.4 mg/dL — ABNORMAL LOW (ref 8.9–10.3)
Creatinine, Ser: 0.63 mg/dL (ref 0.44–1.00)
GFR calc non Af Amer: >60 mL/min (ref >60)
Glucose, Bld: 112 mg/dL — ABNORMAL HIGH (ref 65–99)
POTASSIUM: 3.8 mmol/L (ref 3.5–5.1)
SODIUM: 142 mmol/L (ref 135–145)

## 2015-05-08 MED ORDER — IOHEXOL 350 MG/ML SOLN
100.0000 mL | Freq: Once | INTRAVENOUS | Status: AC | PRN
  Administered 2015-05-08: 100 mL via INTRAVENOUS

## 2015-05-08 MED ORDER — LEVOFLOXACIN 750 MG PO TABS: 750.0000 mg | ORAL_TABLET | Freq: Every day | ORAL | Status: DC

## 2015-05-08 MED ORDER — LEVOFLOXACIN IN D5W 750 MG/150ML IV SOLN
750.0000 mg | INTRAVENOUS | Status: DC
  Administered 2015-05-08: 750 mg via INTRAVENOUS
  Filled 2015-05-08 (×2): qty 150

## 2015-05-08 MED ORDER — FUROSEMIDE 10 MG/ML IJ SOLN
20.0000 mg | Freq: Two times a day (BID) | INTRAMUSCULAR | Status: DC
  Administered 2015-05-08 – 2015-05-09 (×2): 20 mg via INTRAVENOUS
  Filled 2015-05-08 (×2): qty 2

## 2015-05-08 NOTE — Discharge Summary (Addendum)
[facility] - [place] at [facility]   PATIENT NAME: Hannah Vaughan    MR#:  [id number]  DATE OF BIRTH:  1941-02-03  DATE OF ADMISSION:  05/06/2015 ADMITTING PHYSICIAN: Oralia Manis, MD  DATE OF DISCHARGE:05/09/2015  PRIMARY CARE PHYSICIAN: Lauro Regulus., MD    ADMISSION DIAGNOSIS:  Community acquired pneumonia [J18.9]  DISCHARGE DIAGNOSIS:  Principal Problem:   Acute respiratory failure with hypoxemia Active Problems:   HTN (hypertension)   Seizure disorder   CAD (coronary artery disease)   Osteoarthritis   GERD (gastroesophageal reflux disease)   HCAP (healthcare-associated pneumonia)   SECONDARY DIAGNOSIS:   Past Medical History  Diagnosis Date  . HTN (hypertension)   . Seizure   . CAD (coronary artery disease)   . Osteoarthritis   . GERD (gastroesophageal reflux disease)   . Mental retardation     HOSPITAL COURSE:  74 year old female with a history of mental retardation who is a resident of a group home who presented with increasing confusion, lethargy and cough had to have a pneumonia.  1. Healthcare acquired pneumonia: Patient is from a group home and therefore needs broader spectrum antibiotics. Patient was on Levaquin and vancomycin. Blood cx are Negative to date. She will continue on levaquin at discharge.  2. Acute respiratory failure with hypoxia: This is secondary to problem #1 along with  Acute systolic and diastolic heart failure. She will need O2 at discharge. CT chest was ne gative for PE.    Echocardiogram showed EF of 45-50 percent with stage I diastolic dysfunction.   3. Essential hypertension: Blood pressure has improved and she can continue her outpatient medications for hypertension..  4. History of NWG:NFAOZHYQ aspirin and outpatient medications.  5. Hypokalemia: Patient received potassium supplementation.   6. Seizure disorder: Continue phenytoin and phenobarbital. Dilantin level was 13.3   DISCHARGE  CONDITIONS AND DIET:  DYSPHAGIA 1 with thickened liquids Stable condition  CONSULTS OBTAINED:     DRUG ALLERGIES:   Allergies  Allergen Reactions  . Depakote [Valproic Acid]   . Divalproex Sodium Other (See Comments)    DISCHARGE MEDICATIONS:   Current Discharge Medication List    CONTINUE these medications which have CHANGED   Details  levofloxacin (LEVAQUIN) 750 MG tablet Take 1 tablet (750 mg total) by mouth daily. Qty: 5 tablet, Refills: 0  Lasix 20 mg daily     CONTINUE these medications which have NOT CHANGED   Details  acetaminophen (TYLENOL) 325 MG tablet Take 1 tablet by mouth every 6 (six) hours.    amLODipine (NORVASC) 2.5 MG tablet Take 1 tablet by mouth every morning.    aspirin EC 81 MG tablet Take 1 tablet by mouth daily.    calcium-vitamin D (CALCIUM 500/D) 500-200 MG-UNIT per tablet Take 1 tablet by mouth daily.    cetirizine (ZYRTEC) 10 MG tablet Take 1 tablet by mouth daily as needed.    cyanocobalamin (,VITAMIN B-12,) 1000 MCG/ML injection Inject 1 mL into the muscle every 30 (thirty) days.    docusate sodium (STOOL SOFTENER) 100 MG capsule Take 1 capsule by mouth 2 (two) times daily.    etodolac (LODINE) 400 MG tablet Take 1 tablet by mouth 2 (two) times daily.    gabapentin (NEURONTIN) 600 MG tablet Take 1 tablet by mouth 4 (four) times daily.    hydrochlorothiazide (HYDRODIURIL) 25 MG tablet Take 1 tablet by mouth daily.    hydrocortisone (ANUSOL-HC) 25 MG suppository Place 25 mg rectally 2 (two) times daily as needed for  hemorrhoids or itching.    isosorbide mononitrate (IMDUR) 60 MG 24 hr tablet Take 1 tablet by mouth daily.    magnesium hydroxide (MILK OF MAGNESIA) 400 MG/5ML suspension Take 5 mLs by mouth as needed.    metoprolol succinate (TOPROL-XL) 25 MG 24 hr tablet Take 1 tablet by mouth daily.    nitroGLYCERIN (NITROSTAT) 0.4 MG SL tablet Place 1 tablet under the tongue as needed.    omeprazole (PRILOSEC) 20 MG capsule Take 1  capsule by mouth daily.    PHENObarbital 20 MG/5ML elixir Take 15 mLs by mouth at bedtime.    phenytoin (DILANTIN) 100 MG ER capsule Take 260 mg by mouth daily.    tolnaftate (TINACTIN) 1 % spray Apply 1 application topically 2 (two) times daily.    traMADol (ULTRAM) 50 MG tablet Take 1 tablet by mouth every 6 (six) hours as needed.    triamcinolone cream (KENALOG) 0.1 % Apply 1 application topically.      STOP taking these medications     predniSONE (DELTASONE) 20 MG tablet               Today   CHIEF COMPLAINT:  No acute events over night.  VITAL SIGNS:  Blood pressure 131/104, pulse 87, temperature 99.6 F (37.6 C), temperature source Oral, resp. rate 18, height 4\' 11"  (1.499 m), weight 66.316 kg (146 lb 3.2 oz), SpO2 91 %.   REVIEW OF SYSTEMS:  Review of Systems  Unable to perform ROS    PHYSICAL EXAMINATION:  GENERAL:  74 y.o.-year-old patient lying in the bed with no acute distress. Tongue is protruding outward NECK:  Supple, no jugular venous distention. No thyroid enlargement, no tenderness.  LUNGS: Normal breath sounds bilaterally, no wheezing, rales,rhonchi  No use of accessory muscles of respiration.  CARDIOVASCULAR: S1, S2 normal. No murmurs, rubs, or gallops.  ABDOMEN: Soft, non-tender, non-distended. Bowel sounds present. No organomegaly or mass.  EXTREMITIES: No pedal edema, cyanosis, or clubbing.  PSYCHIATRIC: The patient is alert SKIN: No obvious rash, lesion, or ulcer.   DATA REVIEW:   CBC  Recent Labs Lab 05/07/15 0131  WBC 13.0*  HGB 9.5*  HCT 29.8*  PLT 168    Chemistries   Recent Labs Lab 05/06/15 1948  05/08/15 0426  NA 134*  < > 142  K 3.4*  < > 3.8  CL 97*  < > 107  CO2 28  < > 30  GLUCOSE 130*  < > 112*  BUN 35*  < > 15  CREATININE 1.09*  < > 0.63  CALCIUM 8.5*  < > 8.4*  AST 25  --   --   ALT 12*  --   --   ALKPHOS 132*  --   --   BILITOT 0.2*  --   --   < > = values in this interval not  displayed.  Cardiac Enzymes No results for input(s): TROPONINI in the last 168 hours.  Microbiology Results  @[email or web address] @  RADIOLOGY:  Dg Chest Portable 1 View  05/06/2015   CLINICAL DATA:  Hypoxia.  Pneumonia.  EXAM: PORTABLE CHEST - 1 VIEW  COMPARISON:  07/30/2013.  FINDINGS: Cardiopericardial silhouette is enlarged. Pulmonary vascular congestion is present with LEFT-greater-than- RIGHT perihilar and basilar predominant airspace disease. In the setting of cardiomegaly, this likely represents CHF however superimposed pneumonia cannot be excluded.  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  IMPRESSION: Cardiomegaly with bilateral LEFT-greater-than-RIGHT perihilar  and basilar predominant airspace disease favored to represent CHF. Superimposed pneumonia not excluded.   Electronically Signed   By: Andreas Newport M.D.   On: 05/06/2015 21:23      Management plans discussed with nursing staff and CSW Stable for discharge   Patient should follow up with PCP in 1 week  CODE STATUS:     Code Status Orders        Start     Ordered   05/06/15 2353  Full code   Continuous     05/06/15 2352    Advance Directive Documentation        Most Recent Value   Type of Advance Directive  Healthcare Power of Attorney   Pre-existing out of facility DNR order (yellow form or pink MOST form)     "MOST" Form in Place?        TOTAL TIME TAKING CARE OF THIS PATIENT: 35 minutes.  BMP to be checked tomorrow  Tylena Prisk M.D on 05/09/2015 at 8:08 AM  Between 7am to 6pm - Pager - [phone] After 6pm go to [email or web address] - password [email or web address] [place]  [place] [facility]  Office  [phone]  CC: Primary care physician; Lauro Regulus., MD

## 2015-05-08 NOTE — Progress Notes (Signed)
[facility] - [place] at [facility]   PATIENT NAME: Hannah Vaughan    MR#:  [id number]  DATE OF BIRTH:  30-May-1941  SUBJECTIVE:   patient stil requiring oxygen  REVIEW OF SYSTEMS:    Review of Systems  Unable to perform ROS   Tolerating Diet: Yes      DRUG ALLERGIES:   Allergies  Allergen Reactions  . Depakote [Valproic Acid]   . Divalproex Sodium Other (See Comments)    VITALS:  Blood pressure 131/104, pulse 87, temperature 99.6 F (37.6 C), temperature source Oral, resp. rate 18, height  (1.499 m), weight 66.316 kg (146 lb 3.2 oz), SpO2 91 %.  PHYSICAL EXAMINATION:   Physical Exam  Constitutional: She is well-developed, well-nourished, and in no distress. No distress.  HENT:  Head: Normocephalic.  Eyes: No scleral icterus.  Neck: Normal range of motion. Neck supple. No JVD present. No tracheal deviation present.  Cardiovascular: Normal rate, regular rhythm and normal heart sounds.  Exam reveals no gallop and no friction rub.   No murmur heard. Pulmonary/Chest: Effort normal and breath sounds normal. No respiratory distress. She has no wheezes. She has no rales. She exhibits no tenderness.  Abdominal: Soft. Bowel sounds are normal. She exhibits no distension and no mass. There is no tenderness. There is no rebound and no guarding.  Musculoskeletal: Normal range of motion. She exhibits no edema.  Neurological: She is alert.  Skin: Skin is warm. No rash noted. No erythema.  Psychiatric: Affect and judgment normal.      LABORATORY PANEL:   CBC  Recent Labs Lab 05/07/15 0131  WBC 13.0*  HGB 9.5*  HCT 29.8*  PLT 168   ------------------------------------------------------------------------------------------------------------------  Chemistries   Recent Labs Lab 05/06/15 1948  05/08/15 0426  NA 134*  < > 142  K 3.4*  < > 3.8  CL 97*  < > 107  CO2 28  < > 30  GLUCOSE 130*  < > 112*  BUN 35*  < > 15  CREATININE 1.09*   < > 0.63  CALCIUM 8.5*  < > 8.4*  AST 25  --   --   ALT 12*  --   --   ALKPHOS 132*  --   --   BILITOT 0.2*  --   --   < > = values in this interval not displayed. ------------------------------------------------------------------------------------------------------------------  Cardiac Enzymes No results for input(s): TROPONINI in the last 168 hours. ------------------------------------------------------------------------------------------------------------------  RADIOLOGY:  Dg Chest Portable 1 View  05/06/2015   CLINICAL DATA:  Hypoxia.  Pneumonia.  EXAM: PORTABLE CHEST - 1 VIEW  COMPARISON:  07/30/2013.  FINDINGS: Cardiopericardial silhouette is enlarged. Pulmonary vascular congestion is present with LEFT-greater-than- RIGHT perihilar and basilar predominant airspace disease. In the setting of cardiomegaly, this likely represents CHF however superimposed pneumonia cannot be excluded.  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  IMPRESSION: Cardiomegaly with bilateral LEFT-greater-than-RIGHT perihilar and basilar predominant airspace disease favored to represent CHF. Superimposed pneumonia not excluded.   Electronically Signed   By: Andreas Newport M.D.   On: 05/06/2015 21:23     ASSESSMENT AND PLAN:   74 year old female with a history of mental retardation who is a resident of a group home who presented with increasing confusion, lethargy and cough had to have a pneumonia.  1. Healthcare acquired pneumonia: Patient is from a group home and therefore needs broader spectrum antibiotics. Patient is  on Levaquin and vancomycin. Blood cx are Negative to date. 2. Acute respiratory failure with hypoxia: This is secondary to problem #1. CT scan of the chest ordered to evaluate for pulmonary emboli and persistent hypoxia.  3. Essential hypertension: Blood pressure has improved and she  can continue her outpatient medications for hypertension..  4. History of ZOX:[id number] aspirin and outpatient medications.  5. Hypokalemia: Patient received potassium supplementation.   6. Seizure disorder: Continue phenytoin and phenobarbital. Dilantin level was 13.3  Physical therapy and clinical social work consult for disposition.    Management plans discussed with nurse CODE STATUS: Full  TOTAL TIME TAKING CARE OF THIS PATIENT: 30 minutes.     POSSIBLE D/C 1-2 days, DEPENDING ON CLINICAL CONDITION.   Alassane Kalafut M.D on 05/08/2015 at 10:29 AM  Between 7am to 6pm - Pager - [phone] After 6pm go to [email or web address] - password [email or web address] [facility]  [place] [facility]  Office  [phone]  CC: Primary care physician; Lauro Regulus., MD

## 2015-05-08 NOTE — Plan of Care (Signed)
Problem: Acute Rehab PT Goals(only PT should resolve) Goal: Pt Will Go Supine/Side To Sit Pt will demonstrate Min[facility] bed mobility supine to sitting edge-of-bed to return to PLOF and to decrease caregiver burden.      Goal: Patient Will Transfer Sit To/From Stand Pt will transfer sit to/from-stand with RW at [facility] without loss-of-balance to demonstrate good safety awareness for independent mobility in home.     Goal: Pt Will Ambulate Pt will ambulate with RW at [employer] using a step-through pattern and equal step length for a distances greater than 43ft to demonstrate the ability to perform safe household distance ambulation at discharge.

## 2015-05-08 NOTE — Evaluation (Signed)
Physical Therapy Evaluation Patient Details Name: Hannah Vaughan MRN: [id number] DOB: 1940/11/13 Today's Date: 05/08/2015   History of Present Illness  Chiamaka Latka is a 74 y.o. female who presents with increased confusion, lethargy, cough. Patient is a resident of a local group home, and has been having decreased by mouth intake over the last couple of days. She's also been more steady on her feet, more lethargic. She was taken to her primary care physician for evaluation, and had a chest x-ray done which showed a likely pneumonia. She was given outpatient oral antibiotics, and told that if she got any worse that she should be taken to emergency department. Once back in a group home she began acting more lethargic, and according to her caregivers were present with her in the ED today, she was acting as if she might of been in pain. They brought her to the ED for evaluation, where she was found to be hypoxic to the high 80s. Evaluation here showed an elevated white count and repeat chest x-ray showed likely pneumonia versus congestive heart failure. Hospitalists were called for admission for pneumonia.  Clinical Impression  Pt is received semirecumbent in bed upon entry, asleep, but easily rousable and willing to participate. Pt appears to be in mild distress from intermittent coughing and phlegm production, with difficulty in voiding. Pt is alert, pleasant, and following simple commands consistently. Due to communication difficulties, baseline and living situation information taken from SLP note and RN. Strength assessment reveals significant weakness, requiring max-totalA for bed mobility, and strong & equal grip strength. Pt remaining on 1L O2 throughout evaluation, found to be at 89% upon entry, but improving to mid 90's c reposition. Patient presenting with impairment of strength, oxygen perfusion, and activity tolerance, limiting ability to perform ADL and mobility tasks at  baseline level of function.  Patient will benefit from skilled intervention to address the above impairments and limitations, in order to restore to prior level of function, improve patient safety upon discharge, and to decrease falls risk.       Follow Up Recommendations Home health PT (HHPT at group home. )    Equipment Recommendations  None recommended by PT    Recommendations for Other Services       Precautions / Restrictions Precautions Precautions: None Restrictions Weight Bearing Restrictions: No      Mobility  Bed Mobility               General bed mobility comments: Unable to perform. Pt following commands well, initiates movement but requires total assist as well as worsening distress during acitivty including increased coughing and phlem production.   Transfers                    Ambulation/Gait                Stairs            Wheelchair Mobility    Modified Rankin (Stroke Patients Only)       Balance                                             Pertinent Vitals/Pain Pain Assessment: Faces Faces Pain Scale: Hurts even more Pain Location: Pt points to LLQ and R flank c grimacing.  Pain Intervention(s): Monitored during session;Limited activity within patient's tolerance;Patient requesting pain meds-RN notified  Home Living Family/patient expects to be discharged to:: Group home Living Arrangements: Group Home Available Help at Discharge: Personal care attendant Type of Home: Group Home Home Access: Ramped entrance       Home Equipment: Walker - 2 wheels      Prior Function Level of [place]: Needs assistance   Gait / Transfers Assistance Needed: Able to perform slow, limited household distance ambulation c RW per aid at group home.   ADL's / Homemaking Assistance Needed: Assistance c ADL, IADL. On puree diet, nectar thickened liquids at baseline per SLP note.         Hand Dominance        Extremity/Trunk  Assessment   Upper Extremity Assessment: Generalized weakness           Lower Extremity Assessment: Generalized weakness (limited bed mobility, req Max-Total assist and appears distressed. )         Communication   Communication: Expressive difficulties  Cognition Arousal/Alertness: Awake/alert (asleep upon entry ) Behavior During Therapy: Restless;Anxious;WFL for tasks assessed/performed (follows commands well. ) Overall Cognitive Status: History of cognitive impairments - at baseline (difficult to assess secondary to limited ability to communicate with pt. )                      General Comments      Exercises        Assessment/Plan    PT Assessment Patient needs continued PT services  PT Diagnosis Generalized weakness   PT Problem List Decreased strength;Decreased range of motion;Decreased activity tolerance;Decreased balance;Decreased mobility;Decreased coordination  PT Treatment Interventions Gait training;Functional mobility training;Therapeutic activities;Therapeutic exercise;Balance training   PT Goals (Current goals can be found in the Care Plan section) Acute Rehab PT Goals PT Goal Formulation: Patient unable to participate in goal setting Potential to Achieve Goals: Good    Frequency Min 2X/week   Barriers to discharge        Co-evaluation               End of Session   Activity Tolerance: Treatment limited secondary to medical complications (Comment);Patient limited by lethargy Patient left: in bed;with call bell/phone within reach;with bed alarm set Nurse Communication: Mobility status;Other (comment) (appearance of distress and pain. )         Time: 1610-9604 PT Time Calculation (min) (ACUTE ONLY): 19 min   Charges:   PT Evaluation $Initial PT Evaluation Tier I: 1 Procedure     PT G Codes:        Buccola,Allan C May 27, 2015, 4:06 PM  4:11 PM  Rosamaria Lints, PT, DPT [place] License # [id number]

## 2015-05-08 NOTE — Progress Notes (Signed)
ANTIBIOTIC CONSULT NOTE - FOLLOW UP  Pharmacy Consult for levaquin Indication: pneumonia  Allergies  Allergen Reactions  . Depakote [Valproic Acid]   . Divalproex Sodium Other (See Comments)    Patient Measurements: Height:  (149.9 cm) Weight: 146 lb 3.2 oz (66.316 kg) IBW/kg (Calculated) : 43.2   Vital Signs: Temp: 99.5 F (37.5 C) (08/21 1154) Temp Source: Oral (08/21 1154) BP: 117/50 mmHg (08/21 1154) Pulse Rate: 90 (08/21 1154) Intake/Output from previous day: 08/20 0701 - 08/21 0700 In: 360 [P.O.:360] Out: -  Intake/Output from this shift: Total I/O In: 123 [P.O.:120; I.V.:3] Out: -   Labs:  Recent Labs  05/06/15 1948 05/07/15 0131 05/08/15 0426  WBC 19.0* 13.0*  --   HGB 10.5* 9.5*  --   PLT 214 168  --   CREATININE 1.09* 0.96 0.63   Estimated Creatinine Clearance: 52.6 mL/min (by C-G formula based on Cr of 0.63). No results for input(s): VANCOTROUGH, VANCOPEAK, VANCORANDOM, GENTTROUGH, GENTPEAK, GENTRANDOM, TOBRATROUGH, TOBRAPEAK, TOBRARND, AMIKACINPEAK, AMIKACINTROU, AMIKACIN in the last 72 hours.   Microbiology: Recent Results (from the past 720 hour(s))  Blood culture (routine x 2)     Status: None (Preliminary result)   Collection Time: 05/06/15  8:30 AM  Result Value Ref Range Status   Specimen Description BLOOD RIGHT HAND  Final   Special Requests BOTTLES DRAWN AEROBIC AND ANAEROBIC 5CC  Final   Culture NO GROWTH 2 DAYS  Final   Report Status PENDING  Incomplete  Blood culture (routine x 2)     Status: None (Preliminary result)   Collection Time: 05/06/15  7:48 PM  Result Value Ref Range Status   Specimen Description BLOOD LEFT ARM  Final   Special Requests BOTTLES DRAWN AEROBIC AND ANAEROBIC 5CC  Final   Culture NO GROWTH 2 DAYS  Final   Report Status PENDING  Incomplete    Anti-infectives    Start     Dose/Rate Route Frequency Ordered Stop   05/08/15 2200  levofloxacin (LEVAQUIN) IVPB 750 mg  Status:  Discontinued     750  mg 100 mL/hr over 90 Minutes Intravenous Every 48 hours 05/06/15 2352 05/08/15 1356   05/08/15 1800  levofloxacin (LEVAQUIN) IVPB 750 mg     750 mg 100 mL/hr over 90 Minutes Intravenous Every 24 hours 05/08/15 1356     05/08/15 0000  levofloxacin (LEVAQUIN) 750 MG tablet     750 mg Oral Daily 05/08/15 0808     05/07/15 0000  vancomycin (VANCOCIN) IVPB 1000 mg/200 mL premix     1,000 mg 200 mL/hr over 60 Minutes Intravenous  Once 05/06/15 2352 05/07/15 0133   05/07/15 0000  vancomycin (VANCOCIN) IVPB 1000 mg/200 mL premix     1,000 mg 200 mL/hr over 60 Minutes Intravenous Every 24 hours 05/06/15 2352     05/06/15 2130  levofloxacin (LEVAQUIN) IVPB 750 mg     750 mg 100 mL/hr over 90 Minutes Intravenous  Once 05/06/15 2115 05/07/15 0024      Assessment: 74 yof cc weakness with confusion/lethargy/cough. Resident of group home, PCP CXR showed likely PNA. Treating for HCAP.   Plan:  CrCl >50 ml/min, will adjust levaquin to 750 mg IV q24h.   Crist Fat L 05/08/2015,1:57 PM

## 2015-05-08 NOTE — Clinical Social Work Note (Signed)
Clinical Social Work Assessment  Patient Details  Name: Hannah Vaughan MRN: [id number] Date of Birth: 1941-04-12  Date of referral:  05/08/15               Reason for consult:  Facility Placement                Permission sought to share information with:    Permission granted to share information::     Name::        Agency::     Relationship::     Contact Information:     Housing/Transportation Living arrangements for the past 2 months:  Group Home Source of Information:  Facility Patient Interpreter Needed:  None Criminal Activity/Legal Involvement Pertinent to Current Situation/Hospitalization:  No - Comment as needed Significant Relationships:  Siblings Lives with:  Facility Resident Do you feel safe going back to the place where you live?    Need for family participation in patient care:     Care giving concerns:  From a [facility]: [facility]   Social Worker assessment / plan:  CSW contacted the number for patient listed on the facesheet and it was to [facility]. The lady that answered told me to call the Hannah Vaughan On Call Rep: Hannah Vaughan: [phone] to discuss whether or not patient could return to the group home. CSW contacted Mr. Hannah Vaughan and he stated that he would get in touch with the on call nurse for Hannah Vaughan and have her call the patient's nurse to discuss patient's current condition to determine whether or not they feel as though can take her back. CSW coordinated this with Mr. Hannah Vaughan and patient's nurse. Mr. Hannah Vaughan stated that if patient was cleared to return, that he would have someone from the group home come to pick patient up.   Employment status:  Disabled (Comment on whether or not currently receiving Disability) Insurance information:  Medicare PT Recommendations:    Information / Referral to community resources:     Patient/Family's Response to care:  CSW attempted to reach patient's brother, Mr. Howser, at the number for  him on the facesheet but that was an incorrect number.   Patient/Family's Understanding of and Emotional Response to Diagnosis, Current Treatment, and Prognosis:  Patient unable to verbalize/communicate well.  Emotional Assessment Appearance:  Appears stated age Attitude/Demeanor/Rapport:   (unable to rate) Affect (typically observed):   (unable to rate) Orientation:    Alcohol / Substance use:  Not Applicable Psych involvement (Current and /or in the community):  No (Comment)  Discharge Needs  Concerns to be addressed:  Care Coordination Readmission within the last 30 days:  No Current discharge risk:  None Barriers to Discharge:  No Barriers Identified   Hannah Spaniel, LCSW 05/08/2015, 9:20 AM

## 2015-05-08 NOTE — Clinical Social Work Note (Signed)
[name] nurse has contacted patient's nurse and the concern that she has is to do with patient's oxygen saturation level. [name] cannot manage oxygen and if patient will require oxygen at discharge, alternative placement will have to be sought for patient. York Spaniel MSW,LCSW [phone]

## 2015-05-08 NOTE — Progress Notes (Signed)
Plan was to d/c today but O2 sats are around 88% without 1 Liter O2.  (She is to return to her group home but they cannot take her with O2 until they go through their own procedures to set it up.)

## 2015-05-08 NOTE — Clinical Social Work Note (Signed)
MD has canceled patient's discharge as patient's room air sats are 88. MD states she is hoping patient can be weaned off oxygen. She is also going to get PT to assess patient. Patient may need a higher level of care. York Spaniel MSW,LCSW [phone]

## 2015-05-09 ENCOUNTER — Inpatient Hospital Stay (HOSPITAL_COMMUNITY)
Admit: 2015-05-09 | Discharge: 2015-05-09 | Disposition: A | Discharge status: Home / Self Care | Payer: Medicare Other | Attending: Internal Medicine | Admitting: Internal Medicine

## 2015-05-09 DIAGNOSIS — I340 Nonrheumatic mitral (valve) insufficiency: Secondary | ICD-10-CM

## 2015-05-09 LAB — BASIC METABOLIC PANEL
ANION GAP: 7 (ref 5–15)
BUN: 15 mg/dL (ref 6–20)
CALCIUM: 8.4 mg/dL — AB (ref 8.9–10.3)
CO2: 34 mmol/L — ABNORMAL HIGH (ref 22–32)
Chloride: 102 mmol/L (ref 101–111)
Creatinine, Ser: 0.66 mg/dL (ref 0.44–1.00)
Glucose, Bld: 116 mg/dL — ABNORMAL HIGH (ref 65–99)
Potassium: 3.2 mmol/L — ABNORMAL LOW (ref 3.5–5.1)
Sodium: 143 mmol/L (ref 135–145)

## 2015-05-09 LAB — VANCOMYCIN, TROUGH: VANCOMYCIN TR: 9 ug/mL — AB (ref 10–20)

## 2015-05-09 MED ORDER — FUROSEMIDE 10 MG/ML IJ SOLN: 40.0000 mg | Freq: Two times a day (BID) | INTRAMUSCULAR | Status: DC

## 2015-05-09 MED ORDER — POTASSIUM CHLORIDE 20 MEQ/15ML (10%) PO SOLN
40.0000 meq | Freq: Once | ORAL | Status: AC
  Administered 2015-05-09: 40 meq via ORAL
  Filled 2015-05-09: qty 30

## 2015-05-09 MED ORDER — FUROSEMIDE 20 MG PO TABS: 20.0000 mg | ORAL_TABLET | Freq: Two times a day (BID) | ORAL | Status: DC

## 2015-05-09 MED ORDER — TRAMADOL HCL 50 MG PO TABS: 50.0000 mg | ORAL_TABLET | Freq: Four times a day (QID) | ORAL | Status: AC | PRN

## 2015-05-09 NOTE — Progress Notes (Signed)
Patient not being discharged today due to need for O2 for CHF. CSW working on placement

## 2015-05-09 NOTE — Care Management (Cosign Needed)
[facility] O[facility] arranged [facility].  Correction: patient cannot go back to [facility] per [employer] with [name]. CSW working to see if SNF will be covered. O2 cancelled with Will at [facility]. Tank retrieved.

## 2015-05-09 NOTE — Progress Notes (Signed)
Patient is medically stable for D/C today. [name] will not take patient back because she is now requiring oxygen. Clinical Child psychotherapist (CSW) sent SNF referral. CSW presented bed offers to patient's sister (guardian) [name]. [name] chose [facility]. Per [name] she and her brother Onalee Hua are co-guardians. CSW contacted RN Sherrie at [facility] and made her aware of above. Per Alfonse Alpers will send over patient's treatment plan and medication list to [facility]. Per [facility] admissions coordinator at [facility] patient is going to room 308-A. Per Gala Romney patient can come for long term care. CSW prepared D/C packet and sent D/C summary to [facility] via carefinder. Doug completed admissions paper work with patient's brother Onalee Hua at bedside. Please reconsult if future social work needs arise. CSW signing off.   Jetta Lout, LCSWA [phone]

## 2015-05-09 NOTE — Progress Notes (Signed)
*  PRELIMINARY RESULTS* Echocardiogram 2D Echocardiogram has been performed.  Hannah Vaughan 05/09/2015, 11:40 AM

## 2015-05-09 NOTE — Plan of Care (Signed)
Problem: Discharge Progression Outcomes Goal: Other Discharge Outcomes/Goals Outcome: Adequate for Discharge Pt is alert to person, orientation is questionable due to cognitively, unable to tolerate room air, pt to be d/c on oxygen to [facility]. Remains afebrile, denies pain, fair appetite, feeder, incontinent or urine and stool,  Potassium serum low potassium replacement given, family at bedside, pt needs assistance with daily activites, bathed throughout shift. Report given to [facility] [place], pt to be transported via EMS, BUN and creatine remain stable, pt to be d/c on po levaquin.

## 2015-05-09 NOTE — Care Management Important Message (Signed)
Important Message  Patient Details  Name: Hannah Vaughan MRN: [id number] Date of Birth: [birth year]   Medicare Important Message Given:  Yes-second notification given    Verita Schneiders Allmond 05/09/2015, 9:55 AM

## 2015-05-09 NOTE — Progress Notes (Signed)
Physical Therapy Treatment Patient Details Name: Hannah Vaughan MRN: [id number] DOB: May 23, 1941 Today's Date: 05/09/2015    History of Present Illness Hannah Vaughan is a 74 y.o. female who presents with increased confusion, lethargy, cough. Patient is a resident of a local group home, and has been having decreased by mouth intake over the last couple of days. She's also been more steady on her feet, more lethargic. She was taken to her primary care physician for evaluation, and had a chest x-ray done which showed a likely pneumonia. She was given outpatient oral antibiotics, and told that if she got any worse that she should be taken to emergency department. Once back in a group home she began acting more lethargic, and according to her caregivers were present with her in the ED today, she was acting as if she might of been in pain. They brought her to the ED for evaluation, where she was found to be hypoxic to the high 80s. Evaluation here showed an elevated white count and repeat chest x-ray showed likely pneumonia versus congestive heart failure. Hospitalists were called for admission for pneumonia.    PT Comments    Pt tolerating treatment session well, motivated and able to complete entire PT sesssion as planned. Pt continues to make progress toward goals as evidenced by improved indep in bed mobility and transfers. Pt's greatest limitation continues to be weakness in trunk and BLE which continues to limit ability to perform mobility at baseline function. Patient presenting with impairment of strength, pain, range of motion, balance, and activity tolerance, limiting ability to perform ADL and mobility tasks at  baseline level of function. Patient will benefit from skilled intervention to address the above impairments and limitations, in order to restore to prior level of function, improve patient safety upon discharge, and to decrease caregiver burden.    Follow Up Recommendations  Home health PT      Equipment Recommendations  Wheelchair (measurements PT);Wheelchair cushion (measurements PT) (Pt is 4'11", hence an adolecent WC may be appropriate. )    Recommendations for Other Services       Precautions / Restrictions Precautions Precautions: None Restrictions Weight Bearing Restrictions: No    Mobility  Bed Mobility Overal bed mobility: Needs Assistance Bed Mobility: Sit to Supine;Supine to Sit     Supine to sit: Mod assist Sit to supine: Mod assist   General bed mobility comments: follows commands well, initiates movement, able to assist c scooting to [facility]; trunk weakness noted.   Transfers Overall transfer level: Modified independent Equipment used: Standard walker (pediatric standard walker) Transfers: Sit to/from Stand Sit to Stand: Min assist         General transfer comment: Stands c knees in recurvatum and hyperlordosis; once up able to stand s SW, but follows cues to maintain hold on walker for safety.   Ambulation/Gait Ambulation/Gait assistance: Mod assist Ambulation Distance (Feet): 2 Feet Assistive device:  (pediatric standard walker )       General Gait Details: begins to walk c mild ataxia, sways right c LOB requiring PT to correct; AMB trial ended early secondary to BM.    Stairs            Wheelchair Mobility    Modified Rankin (Stroke Patients Only)       Balance Overall balance assessment: Needs assistance  Cognition Arousal/Alertness: Awake/alert Behavior During Therapy: Restless;Anxious;WFL for tasks assessed/performed Overall Cognitive Status: History of cognitive impairments - at baseline                      Exercises      General Comments        Pertinent Vitals/Pain Pain Assessment:  (Pt unable to verbalize pain, appears to be no distress today. )    Home Living                      Prior Function            PT Goals (current goals can  now be found in the care plan section) Acute Rehab PT Goals PT Goal Formulation: Patient unable to participate in goal setting Potential to Achieve Goals: Good Progress towards PT goals: Progressing toward goals    Frequency  Min 2X/week    PT Plan Current plan remains appropriate    Co-evaluation             End of Session Equipment Utilized During Treatment: Gait belt Activity Tolerance: Other (comment) (limited by incontinence/ commencement of BM. ) Patient left: in bed;with call bell/phone within reach;with bed alarm set;with family/visitor present     Time: 1610-9604 PT Time Calculation (min) (ACUTE ONLY): 17 min  Charges:  $Therapeutic Activity: 8-22 mins                    G Codes:      Buccola,Allan C May 21, 2015, 12:31 PM  12:32 PM  Rosamaria Lints, PT, DPT [place] License # [id number]

## 2015-05-09 NOTE — Progress Notes (Signed)
SATURATION QUALIFICATIONS: (This note is used to comply with regulatory documentation for home oxygen)  Patient Saturations on Room Air at Rest = 88%  Patient Saturations on Room Air while Ambulating = 80%  Patient Saturations on 2 Liters of oxygen while Ambulating = 90  Please briefly explain why patient needs home oxygen: CHF

## 2015-05-09 NOTE — Clinical Social Work Placement (Signed)
   CLINICAL SOCIAL WORK PLACEMENT  NOTE  Date:  05/09/2015  Patient Details  Name: Hannah Vaughan MRN: [id number] Date of Birth: 10-16-1940  Clinical Social Work is seeking post-discharge placement for this patient at the Skilled  Nursing Facility level of care (*CSW will initial, date and re-position this form in  chart as items are completed):  Yes   Patient/family provided with [facility] Clinical Social Work Department's list of facilities offering this level of care within the geographic area requested by the patient (or if unable, by the patient's family).  Yes   Patient/family informed of their freedom to choose among providers that offer the needed level of care, that participate in Medicare, Medicaid or managed care program needed by the patient, have an available bed and are willing to accept the patient.  Yes   Patient/family informed of [facility]'s ownership interest in [facility] and [facility], as well as of the fact that they are under no obligation to receive care at these facilities.  PASRR submitted to EDS on 05/09/15     PASRR number received on 05/09/15     Existing PASRR number confirmed on       FL2 transmitted to all facilities in geographic area requested by pt/family on 05/09/15     FL2 transmitted to all facilities within larger geographic area on 05/09/15     Patient informed that his/her managed care company has contracts with or will negotiate with certain facilities, including the following:        Yes   Patient/family informed of bed offers received.  Patient chooses bed at  [facility] )     Physician recommends and patient chooses bed at      Patient to be transferred to  [facility] ) on 05/09/15.  Patient to be transferred to facility by  [facility] )     Patient family notified on 05/09/15 of transfer.  Name of family member notified:   ([name] and brother Onalee Hua are aware of D/C. )      PHYSICIAN       Additional Comment:    _______________________________________________ Haig Prophet, LCSW 05/09/2015, 3:21 PM

## 2015-05-09 NOTE — Progress Notes (Signed)
Notified Dr. Juliene Pina that o2 saturation drops on roor air, pt recovered to 91 percent on 2 L, MD acknowledged.

## 2015-05-11 LAB — PHENYTOIN LEVEL, FREE AND TOTAL
PHENYTOIN FREE: 3.3 ug/mL — AB (ref 1.0–2.0)
PHENYTOIN, TOTAL: 16 ug/mL (ref 10.0–20.0)

## 2015-05-11 LAB — CULTURE, BLOOD (ROUTINE X 2)
CULTURE: NO GROWTH
CULTURE: NO GROWTH

## 2015-06-30 ENCOUNTER — Ambulatory Visit (INDEPENDENT_AMBULATORY_CARE_PROVIDER_SITE_OTHER): Payer: Medicare Other | Admitting: Obstetrics and Gynecology

## 2015-06-30 ENCOUNTER — Encounter: Payer: Self-pay | Admitting: Obstetrics and Gynecology

## 2015-06-30 VITALS — BP 127/68 | HR 67 | Ht 59.0 in | Wt 139.0 lb

## 2015-06-30 DIAGNOSIS — A499 Bacterial infection, unspecified: Secondary | ICD-10-CM | POA: Diagnosis not present

## 2015-06-30 DIAGNOSIS — N760 Acute vaginitis: Secondary | ICD-10-CM

## 2015-06-30 DIAGNOSIS — L292 Pruritus vulvae: Secondary | ICD-10-CM | POA: Diagnosis not present

## 2015-06-30 DIAGNOSIS — B9689 Other specified bacterial agents as the cause of diseases classified elsewhere: Secondary | ICD-10-CM

## 2015-06-30 MED ORDER — FLUCONAZOLE 150 MG PO TABS: 150.0000 mg | ORAL_TABLET | Freq: Once | ORAL | Status: DC

## 2015-06-30 MED ORDER — METRONIDAZOLE 500 MG PO TABS: 500.0000 mg | ORAL_TABLET | Freq: Two times a day (BID) | ORAL | Status: DC

## 2015-06-30 NOTE — Addendum Note (Signed)
Addended by: Marchelle FolksMILLER, Giliana Vantil G on: 06/30/2015 11:44 AM   Modules accepted: Orders

## 2015-06-30 NOTE — Progress Notes (Signed)
Patient ID: Hannah GroutDoris E Vaughan, female   DOB: 08-18-1941, 74 y.o.   MRN: [id number]   Vaginal itching and d/c x 2 d foul odor  Chief complaint: 1.  Vaginal itching. 2.  Vaginal discharge. 3.  Vaginal odor.  [facility] presents for evaluation.No recent antibiotic use.  Past medical history, past surgical history, problem list, medications, and allergies are reviewed.  OBJECTIVE: BP 127/68 mmHg  Pulse 67  Ht 4\' 11"  (1.499 m)  Wt 139 lb (63.05 kg)  BMI 28.06 kg/m2 Pleasant elderly female with severe mental retardation, compliant. Pelvic exam: External genitalia-covered with BUS-normal. Vagina-difficult to assess due to noncompliance; wet prep obtained.  PROCEDURE: Wet prep.  Normal saline.-Clue cells present; few white blood cells.  KOH-no hyphae  IMPRESSION: 1.  Bacterial vaginosis.  PLAN: 1.  Metronidazole 500 mg twice a day for 7 days; Diflucan 150 mg once 2.  Follow up as needed. 3.  Nu swab is performed for BV, Yeast, Trich, CT, GC  Herold HarmsMartin A Roza Creamer, MD

## 2015-06-30 NOTE — Patient Instructions (Addendum)
1.  Metronidazole 500 mg twice a day for 7 days; Diflucan 150 mg orally once. 2.  Follow up as needed. 3.  Nu Swab test was completed; will notify [facility] with results when available.

## 2015-07-04 LAB — NUSWAB VAGINITIS PLUS (VG+)
Candida glabrata, NAA: NEGATIVE
TRICH VAG BY NAA: NEGATIVE

## 2015-09-06 ENCOUNTER — Ambulatory Visit (INDEPENDENT_AMBULATORY_CARE_PROVIDER_SITE_OTHER): Payer: Medicare Other | Admitting: Obstetrics and Gynecology

## 2015-09-06 ENCOUNTER — Encounter: Payer: Self-pay | Admitting: Obstetrics and Gynecology

## 2015-09-06 VITALS — BP 116/66 | HR 51 | Ht 60.0 in | Wt 140.0 lb

## 2015-09-06 DIAGNOSIS — Z0000 Encounter for general adult medical examination without abnormal findings: Secondary | ICD-10-CM | POA: Diagnosis not present

## 2015-09-06 DIAGNOSIS — F79 Unspecified intellectual disabilities: Secondary | ICD-10-CM | POA: Diagnosis not present

## 2015-09-06 DIAGNOSIS — Z780 Asymptomatic menopausal state: Secondary | ICD-10-CM

## 2015-09-06 DIAGNOSIS — Z01419 Encounter for gynecological examination (general) (routine) without abnormal findings: Secondary | ICD-10-CM

## 2015-09-06 DIAGNOSIS — Z124 Encounter for screening for malignant neoplasm of cervix: Secondary | ICD-10-CM

## 2015-09-06 DIAGNOSIS — Z1239 Encounter for other screening for malignant neoplasm of breast: Secondary | ICD-10-CM

## 2015-09-06 NOTE — Patient Instructions (Signed)
1.  No Pap smear is needed. 2.  Mammogram is scheduled. 3.  Return in 1 year or as needed for gynecologic issues.

## 2015-09-06 NOTE — Progress Notes (Signed)
GYN ANNUAL PREVENTATIVE CARE ENCOUNTER NOTE  Subjective:       Hannah Vaughan is a 74 y.o. No obstetric history on file. female here for a routine annual gynecologic exam.  Current complaints:  1.  Annual gynecologic evaluation.  74 year old white female, para 0, with profound mental retardation and seizure disorder, on no hormone replacement therapy, presents for her routine gynecologic evaluation in referral from the [facility]. No recent history of vaginal bleeding, vaginal discharge, or pelvic pain.  The patient does wear depends for incontinence issues..     Gynecologic History No LMP recorded. Patient has had a hysterectomy. Contraception: menopause Last Pap: not needed Last mammogram: 11/2014 within normal limits  Obstetric History OB History  No data available    Past Medical History  Diagnosis Date  . HTN (hypertension)   . Seizure (HCC)   . CAD (coronary artery disease)   . Osteoarthritis   . GERD (gastroesophageal reflux disease)   . Pneumococcus infection   . Mental retardation   . History of hiatal hernia   . Seasonal allergies   . Seizure [facility])     Past Surgical History  Procedure Laterality Date  . Cholecystectomy      Current Outpatient Prescriptions on File Prior to Visit  Medication Sig Dispense Refill  . acetaminophen (TYLENOL) 325 MG tablet Take 1 tablet by mouth every 6 (six) hours.    Marland Kitchen aspirin EC 81 MG tablet Take 1 tablet by mouth daily.    . calcium-vitamin D (CALCIUM 500/D) 500-200 MG-UNIT per tablet Take 1 tablet by mouth daily.    . cetirizine (ZYRTEC) 10 MG tablet Take 1 tablet by mouth daily as needed.    . cyanocobalamin (,VITAMIN B-12,) 1000 MCG/ML injection Inject 1 mL into the muscle every 30 (thirty) days.    Marland Kitchen docusate sodium (STOOL SOFTENER) 100 MG capsule Take 1 capsule by mouth 2 (two) times daily.    Marland Kitchen etodolac (LODINE) 400 MG tablet Take 1 tablet by mouth 2 (two) times daily.    Marland Kitchen gabapentin (NEURONTIN) 600 MG tablet Take  1 tablet by mouth 4 (four) times daily.    . hydrochlorothiazide (HYDRODIURIL) 25 MG tablet Take 1 tablet by mouth daily.    . hydrocortisone (ANUSOL-HC) 25 MG suppository Place 25 mg rectally 2 (two) times daily as needed for hemorrhoids or itching.    . isosorbide mononitrate (IMDUR) 60 MG 24 hr tablet Take 1 tablet by mouth daily.    . magnesium hydroxide (MILK OF MAGNESIA) 400 MG/5ML suspension Take 5 mLs by mouth as needed.    . metoprolol succinate (TOPROL-XL) 25 MG 24 hr tablet Take 1 tablet by mouth daily.    . nitroGLYCERIN (NITROSTAT) 0.4 MG SL tablet Place 1 tablet under the tongue as needed.    Marland Kitchen omeprazole (PRILOSEC) 20 MG capsule Take 1 capsule by mouth daily.    Marland Kitchen PHENObarbital 20 MG/5ML elixir Take 15 mLs by mouth at bedtime.    . phenytoin (DILANTIN) 100 MG ER capsule Take 260 mg by mouth daily.    Marland Kitchen tolnaftate (TINACTIN) 1 % spray Apply 1 application topically 2 (two) times daily.    . traMADol (ULTRAM) 50 MG tablet Take 1 tablet (50 mg total) by mouth every 6 (six) hours as needed. 30 tablet 0  . triamcinolone cream (KENALOG) 0.1 % Apply 1 application topically.     No current facility-administered medications on file prior to visit.    Allergies  Allergen Reactions  .  Depakote [Valproic Acid]   . Divalproex Sodium Other (See Comments)    Social History   Social History  . Marital Status: Single    Spouse Name: N/A  . Number of Children: N/A  . Years of Education: N/A   Occupational History  . Not on file.   Social History Main Topics  . Smoking status: Never Smoker   . Smokeless tobacco: Not on file  . Alcohol Use: No  . Drug Use: No  . Sexual Activity: No   Other Topics Concern  . Not on file   Social History Narrative    Family History  Problem Relation Age of Onset  . CAD Mother   . Congestive Heart Failure Mother   . CVA Father   . Heart attack Brother   . Hypertension Sister   . Diabetes Sister     The following portions of the  patient's history were reviewed and updated as appropriate: allergies, current medications, past family history, past medical history, past social history, past surgical history and problem list.  Review of Systems ROS unable to obtain due to mental retardation.  No issues voiced from attendants.   Objective:   BP 116/66 mmHg  Pulse 51  Ht 5' (1.524 m)  Wt 140 lb (63.504 kg)  BMI 27.34 kg/m2 Physical Exam  Pleasant, cooperative white female with profound mental retardation. Skin-no evidence of rash or lesion. Head and neck-supple; no thyromegaly; no adenopathy. Lungs-clear. Heart-regular rate and rhythm. Breasts-no dominant mass, adenopathy, or nipple discharge. Abdomen-soft and without organomegaly; laparoscopic cholecystectomy scars are present and healed. Pelvic- External genitalia-normal. BUS-normal. Bimanual exam-deferred. Rectal-no skin changes. Peripheral vascular-skin warm and dry; no lesions or edema. Lymphatic-no inguinal lymphadenopathy.    Assessment:   Annual gynecologic examination 74 y.o. Contraception: menopause Obesity 1 Patient Active Problem List   Diagnosis Date Noted  . Acute respiratory failure with hypoxemia (HCC) 05/06/2015  . HTN (hypertension) 05/06/2015  . Seizure disorder (HCC) 05/06/2015  . CAD (coronary artery disease) 05/06/2015  . Osteoarthritis 05/06/2015  . GERD (gastroesophageal reflux disease) 05/06/2015  . HCAP (healthcare-associated pneumonia) 05/06/2015  . Abnormal ECG 06/24/2014  . 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 Dependent edema 06/06/2014     Plan:  Pap: Not needed Mammogram: Ordered Labs: per Dr. Dareen PianoAnderson Routine preventative health maintenance measures emphasized: Diet/Weight control, Tobacco Cessation and Alcohol/Drug use Return to Clinic - 1 Year   Herold HarmsMartin A Aleem Elza, MD  Note: This dictation was prepared with Dragon dictation along with smaller phrase technology. Any transcriptional errors that result from this  process are unintentional.

## 2016-07-12 ENCOUNTER — Emergency Department
Admission: EM | Admit: 2016-07-12 | Discharge: 2016-07-12 | Disposition: A | Discharge status: Home / Self Care | Payer: Medicare Other | Attending: Emergency Medicine | Admitting: Emergency Medicine

## 2016-07-12 ENCOUNTER — Emergency Department: Payer: Medicare Other

## 2016-07-12 ENCOUNTER — Encounter: Payer: Self-pay | Admitting: Emergency Medicine

## 2016-07-12 DIAGNOSIS — I10 Essential (primary) hypertension: Secondary | ICD-10-CM | POA: Insufficient documentation

## 2016-07-12 DIAGNOSIS — Z7982 Long term (current) use of aspirin: Secondary | ICD-10-CM | POA: Insufficient documentation

## 2016-07-12 DIAGNOSIS — I2510 Atherosclerotic heart disease of native coronary artery without angina pectoris: Secondary | ICD-10-CM | POA: Diagnosis not present

## 2016-07-12 DIAGNOSIS — N3000 Acute cystitis without hematuria: Secondary | ICD-10-CM | POA: Diagnosis not present

## 2016-07-12 DIAGNOSIS — R079 Chest pain, unspecified: Secondary | ICD-10-CM | POA: Diagnosis present

## 2016-07-12 DIAGNOSIS — Z79899 Other long term (current) drug therapy: Secondary | ICD-10-CM | POA: Diagnosis not present

## 2016-07-12 LAB — URINALYSIS COMPLETE WITH MICROSCOPIC (ARMC ONLY)
BILIRUBIN URINE: NEGATIVE
GLUCOSE, UA: NEGATIVE mg/dL
Ketones, ur: NEGATIVE mg/dL
Nitrite: NEGATIVE
PH: 5 (ref 5.0–8.0)
Protein, ur: 100 mg/dL — AB
Specific Gravity, Urine: 1.01 (ref 1.005–1.030)
Squamous Epithelial / LPF: NONE SEEN

## 2016-07-12 LAB — COMPREHENSIVE METABOLIC PANEL
ALBUMIN: 2.6 g/dL — AB (ref 3.5–5.0)
ALK PHOS: 142 U/L — AB (ref 38–126)
ALT: 9 U/L — ABNORMAL LOW (ref 14–54)
ANION GAP: 10 (ref 5–15)
AST: 15 U/L (ref 15–41)
BILIRUBIN TOTAL: 0.5 mg/dL (ref 0.3–1.2)
BUN: 24 mg/dL — AB (ref 6–20)
CALCIUM: 8.7 mg/dL — AB (ref 8.9–10.3)
CO2: 29 mmol/L (ref 22–32)
Chloride: 99 mmol/L — ABNORMAL LOW (ref 101–111)
Creatinine, Ser: 0.85 mg/dL (ref 0.44–1.00)
GFR calc Af Amer: >60 mL/min (ref >60)
GLUCOSE: 117 mg/dL — AB (ref 65–99)
Potassium: 3.3 mmol/L — ABNORMAL LOW (ref 3.5–5.1)
Sodium: 138 mmol/L (ref 135–145)
TOTAL PROTEIN: 6.6 g/dL (ref 6.5–8.1)

## 2016-07-12 LAB — CBC WITH DIFFERENTIAL/PLATELET
BASOS PCT: 1 %
Basophils Absolute: 0.1 10*3/uL (ref 0–0.1)
EOS ABS: 0.4 10*3/uL (ref 0–0.7)
EOS PCT: 5 %
HCT: 31.3 % — ABNORMAL LOW (ref 35.0–47.0)
Hemoglobin: 9.9 g/dL — ABNORMAL LOW (ref 12.0–16.0)
LYMPHS ABS: 2.4 10*3/uL (ref 1.0–3.6)
Lymphocytes Relative: 30 %
MCH: 23.9 pg — AB (ref 26.0–34.0)
MCHC: 31.7 g/dL — AB (ref 32.0–36.0)
MCV: 75.5 fL — ABNORMAL LOW (ref 80.0–100.0)
MONOS PCT: 9 %
Monocytes Absolute: 0.7 10*3/uL (ref 0.2–0.9)
Neutro Abs: 4.3 10*3/uL (ref 1.4–6.5)
Neutrophils Relative %: 55 %
PLATELETS: 356 10*3/uL (ref 150–440)
RBC: 4.15 MIL/uL (ref 3.80–5.20)
RDW: 18.6 % — AB (ref 11.5–14.5)
WBC: 7.8 10*3/uL (ref 3.6–11.0)

## 2016-07-12 LAB — LIPASE, BLOOD: LIPASE: 26 U/L (ref 11–51)

## 2016-07-12 LAB — TROPONIN I
Troponin I: <0.03 ng/mL (ref <0.03)
Troponin I: <0.03 ng/mL (ref <0.03)

## 2016-07-12 MED ORDER — CEFTRIAXONE SODIUM-DEXTROSE 1-3.74 GM-% IV SOLR
INTRAVENOUS | Status: AC
  Administered 2016-07-12: 1000 mg via INTRAVENOUS
  Filled 2016-07-12: qty 50

## 2016-07-12 MED ORDER — SODIUM CHLORIDE 0.9 % IV BOLUS (SEPSIS)
500.0000 mL | Freq: Once | INTRAVENOUS | Status: AC
  Administered 2016-07-12: 500 mL via INTRAVENOUS

## 2016-07-12 MED ORDER — CEFTRIAXONE SODIUM-DEXTROSE 1-3.74 GM-% IV SOLR
1.0000 g | INTRAVENOUS | Status: DC
  Administered 2016-07-12: 1000 mg via INTRAVENOUS

## 2016-07-12 MED ORDER — CEPHALEXIN 250 MG/5ML PO SUSR: 500.0000 mg | Freq: Two times a day (BID) | ORAL | 0 refills | Status: DC

## 2016-07-12 MED ORDER — DEXTROSE 5 % IV SOLN: 1.0000 g | Freq: Once | INTRAVENOUS | Status: DC

## 2016-07-12 NOTE — ED Triage Notes (Signed)
[facility] pt [facility] , pt was not acting her normal , " she was beating on her chest" on call RN was called and administered x2 nitro, pt is non-verbal, moans with communication,  Staff [facility] from [facility] present  Harlin HeysKadijah Atkins,

## 2016-07-12 NOTE — ED Notes (Addendum)
IV from left wrist remvoed. Placed prior to this RN

## 2016-07-12 NOTE — ED Provider Notes (Signed)
-----------------------------------------   6:29 PM on 07/12/2016 -----------------------------------------   Blood pressure 129/66, pulse 60, temperature 97.6 F (36.4 C), temperature source Axillary, resp. rate 18, height 5' (1.524 m), weight 100 lb (45.4 kg), SpO2 97 %.  Assuming care from Dr. Scotty CourtStafford.  In short, Hannah Vaughan is a 75 y.o. female with a chief complaint of Chest Pain .  Refer to the original H&P for additional details.  The current plan of care is to repeat the patient's troponin and if it's normal discharge the patient back to the Center. Repeat troponin was less than 0.03 and the patient be discharged per Dr. Charmian MuffStafford's instructions.    Jennye MoccasinBrian S Keijuan Schellhase, MD 07/12/16 [id number]

## 2016-07-12 NOTE — ED Provider Notes (Signed)
[facility] Emergency Department Provider Note  ____________________________________________  Time seen: Approximately 2:01 PM  I have reviewed the triage vital signs and the nursing notes.   HISTORY  Chief Complaint Chest Pain  Level 5 caveat:  Portions of the history and physical were unable to be obtained due to the patient's chronic nonverbal state   HPI Hannah Vaughan is a 75 y.o. female brought to the ED with her caregiver from the [facility] for chest pain. Patient is chronically nonverbal, but with her caregiver who is able to better interpret the patient's phonation's, were able to ascertain that the patient complains of chest pain and left arm pain. Does not radiate to her back. No vomiting. It is still present but may be better than before. She was given nitroglycerin which is a chronic medication for her at the facility when she complained of the chest pain, but this caused her blood pressure to decrease. Patient is not able to further clarify her symptoms as she is best able to just answer yes and no questions    Past Medical History:  Diagnosis Date  . CAD (coronary artery disease)   . GERD (gastroesophageal reflux disease)   . History of hiatal hernia   . HTN (hypertension)   . Mental retardation   . Osteoarthritis   . Pneumococcus infection   . Seasonal allergies   . Seizure (HCC)   . Seizure [facility])      Patient Active Problem List   Diagnosis Date Noted  . Acute respiratory failure with hypoxemia (HCC) 05/06/2015  . HTN (hypertension) 05/06/2015  . Seizure disorder (HCC) 05/06/2015  . CAD (coronary artery disease) 05/06/2015  . Osteoarthritis 05/06/2015  . GERD (gastroesophageal reflux disease) 05/06/2015  . HCAP (healthcare-associated pneumonia) 05/06/2015  . Abnormal ECG 06/24/2014  . 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 Dependent edema 06/06/2014     Past Surgical History:  Procedure Laterality Date  .  CHOLECYSTECTOMY       Prior to Admission medications   Medication Sig Start Date End Date Taking? Authorizing Provider  acetaminophen (TYLENOL) 325 MG tablet Take 1 tablet by mouth every 6 (six) hours.    Historical Provider, MD  aspirin EC 81 MG tablet Take 1 tablet by mouth daily. 11/11/14   Historical Provider, MD  calcium-vitamin D (CALCIUM 500/D) 500-200 MG-UNIT per tablet Take 1 tablet by mouth daily.    Historical Provider, MD  cephALEXin (KEFLEX) 250 MG/5ML suspension Take 10 mLs (500 mg total) by mouth 2 (two) times daily. 07/12/16   Sharman Cheek, MD  cetirizine (ZYRTEC) 10 MG tablet Take 1 tablet by mouth daily as needed. 03/25/14   Historical Provider, MD  cyanocobalamin (,VITAMIN B-12,) 1000 MCG/ML injection Inject 1 mL into the muscle every 30 (thirty) days.    Historical Provider, MD  docusate sodium (STOOL SOFTENER) 100 MG capsule Take 1 capsule by mouth 2 (two) times daily.    Historical Provider, MD  etodolac (LODINE) 400 MG tablet Take 1 tablet by mouth 2 (two) times daily.    Historical Provider, MD  gabapentin (NEURONTIN) 600 MG tablet Take 1 tablet by mouth 4 (four) times daily. 06/11/14   Historical Provider, MD  hydrochlorothiazide (HYDRODIURIL) 25 MG tablet Take 1 tablet by mouth daily.    Historical Provider, MD  hydrocortisone (ANUSOL-HC) 25 MG suppository Place 25 mg rectally 2 (two) times daily as needed for hemorrhoids or itching.    Historical Provider, MD  isosorbide mononitrate (IMDUR) 60 MG  24 hr tablet Take 1 tablet by mouth daily. 02/11/14   Historical Provider, MD  magnesium hydroxide (MILK OF MAGNESIA) 400 MG/5ML suspension Take 5 mLs by mouth as needed.    Historical Provider, MD  metoprolol succinate (TOPROL-XL) 25 MG 24 hr tablet Take 1 tablet by mouth daily.    Historical Provider, MD  nitroGLYCERIN (NITROSTAT) 0.4 MG SL tablet Place 1 tablet under the tongue as needed. 04/06/15   Historical Provider, MD  omeprazole (PRILOSEC) 20 MG capsule Take 1 capsule  by mouth daily. 01/06/15   Historical Provider, MD  PHENObarbital 20 MG/5ML elixir Take 15 mLs by mouth at bedtime.    Historical Provider, MD  phenytoin (DILANTIN) 100 MG ER capsule Take 260 mg by mouth daily.    Historical Provider, MD  tolnaftate (TINACTIN) 1 % spray Apply 1 application topically 2 (two) times daily.    Historical Provider, MD  traMADol (ULTRAM) 50 MG tablet Take 1 tablet (50 mg total) by mouth every 6 (six) hours as needed. 05/09/15   Enid Baas, MD  triamcinolone cream (KENALOG) 0.1 % Apply 1 application topically.    Historical Provider, MD     Allergies Depakote [valproic acid] and Divalproex sodium   Family History  Problem Relation Age of Onset  . CAD Mother   . Congestive Heart Failure Mother   . CVA Father   . Heart attack Brother   . Hypertension Sister   . Diabetes Sister     Social History Social History  Substance Use Topics  . Smoking status: Never Smoker  . Smokeless tobacco: Never Used  . Alcohol use No    Review of Systems  Constitutional:   No fever.  ENT:   No sore throat.  Cardiovascular:   Positive chest pain. Respiratory:   No dyspnea  Gastrointestinal:   Negative for abdominal pain,  10-point ROS otherwise negative.  ____________________________________________   PHYSICAL EXAM:  VITAL SIGNS: ED Triage Vitals  Enc Vitals Group     BP 07/12/16 1307 119/65     Pulse Rate 07/12/16 1307 (!) 53     Resp 07/12/16 1307 20     Temp 07/12/16 1307 97.6 F (36.4 C)     Temp Source 07/12/16 1307 Axillary     SpO2 07/12/16 1307 97 %     Weight 07/12/16 1303 100 lb (45.4 kg)     Height 07/12/16 1303 5' (1.524 m)     Head Circumference --      Peak Flow --      Pain Score --      Pain Loc --      Pain Edu? --      Excl. in GC? --     Vital signs reviewed, nursing assessments reviewed.   Constitutional:   Alert and orientedTo self, at apparent cognitive baseline. Not in distress. Eyes:   No scleral icterus. No  conjunctival pallor. PERRL. EOMI.  No nystagmus. ENT   Head:   Normocephalic and atraumatic.   Nose:   No congestion/rhinnorhea. No septal hematoma   Mouth/Throat:   MMM, no pharyngeal erythema. No peritonsillar mass.    Neck:   No stridor. No SubQ emphysema. No meningismus. Hematological/Lymphatic/Immunilogical:   No cervical lymphadenopathy. Cardiovascular:   RRR. Symmetric bilateral radial and DP pulses.  No murmurs.  Respiratory:   Normal respiratory effort without tachypnea nor retractions. Breath sounds are clear and equal bilaterally. No wheezes/rales/rhonchi. Chest wall nontender Gastrointestinal:   Soft and nontender. Non distended. There is  no CVA tenderness.  No rebound, rigidity, or guarding. Genitourinary:   deferred Musculoskeletal:   Nontender with normal range of motion in all extremities. No joint effusions.  No lower extremity tenderness.  No edema. Neurologic:   Normal speech and language.  CN 2-10 normal. Motor grossly intact. No gross focal neurologic deficits are appreciated.  Skin:    Skin is warm, dry and intact. No rash noted.  No petechiae, purpura, or bullae.  ____________________________________________    LABS (pertinent positives/negatives) (all labs ordered are listed, but only abnormal results are displayed) Labs Reviewed  COMPREHENSIVE METABOLIC PANEL - Abnormal; Notable for the following:       Result Value   Potassium 3.3 (*)    Chloride 99 (*)    Glucose, Bld 117 (*)    BUN 24 (*)    Calcium 8.7 (*)    Albumin 2.6 (*)    ALT 9 (*)    Alkaline Phosphatase 142 (*)    All other components within normal limits  CBC WITH DIFFERENTIAL/PLATELET - Abnormal; Notable for the following:    Hemoglobin 9.9 (*)    HCT 31.3 (*)    MCV 75.5 (*)    MCH 23.9 (*)    MCHC 31.7 (*)    RDW 18.6 (*)    All other components within normal limits  URINALYSIS COMPLETEWITH MICROSCOPIC ([facility] ONLY) - Abnormal; Notable for the following:    Color, Urine  YELLOW (*)    APPearance TURBID (*)    Hgb urine dipstick 1+ (*)    Protein, ur 100 (*)    Leukocytes, UA 3+ (*)    Bacteria, UA MANY (*)    All other components within normal limits  URINE CULTURE  LIPASE, BLOOD  TROPONIN I  TROPONIN I   ____________________________________________   EKG  Interpreted by me Normal sinus rhythm rate of 64, normal axis and intervals. Poor R-wave progression in anterior precordial leads. Normal ST segments. 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  ____________________________________________    RADIOLOGY  Chest x-ray unremarkable  ____________________________________________   PROCEDURES Procedures  ____________________________________________   INITIAL IMPRESSION / ASSESSMENT AND PLAN / ED COURSE  Pertinent labs & imaging results that were available during my care of the patient were reviewed by me and considered in my medical decision making (see chart for details).  Patient not in distress, complains of chest pain. Has a history of angina. We'll check labs, further monitoring of symptoms in the ED at this time.   ----------------------------------------- 3:38 PM on 07/12/2016 -----------------------------------------  Vitals remain stable and normal. Workup reveals an obvious urinary tract infection. Troponin is negative. Due to the lack of reliable history, we'll check a second troponin. We'll give some IV fluids and ceftriaxone IV to treat the urinary tract infection. Case signed out to Dr. Huel CoteQuigley to follow-up on second troponin. Plan for discharge home with oral Keflex solution and follow up with primary care.    Clinical Course  Value Comment By Time  Sodium: 138 (Reviewed) Sharman CheekPhillip Chelsa Stout, MD 10/26 1407   ____________________________________________   FINAL CLINICAL IMPRESSION(S) / ED DIAGNOSES  Final diagnoses:  Nonspecific chest pain  Acute cystitis without hematuria       Portions of  this note were generated with dragon dictation software. Dictation errors may occur despite best attempts at proofreading.    Sharman CheekPhillip Myra Weng, MD 07/12/16 1539

## 2016-07-14 LAB — URINE CULTURE

## 2016-08-31 NOTE — Progress Notes (Deleted)
GYN ANNUAL PREVENTATIVE CARE ENCOUNTER NOTE  Subjective:       Hannah Vaughan is a 75 y.o. No obstetric history on file. female here for a routine annual gynecologic exam.  Current complaints:  1.  Annual gynecologic evaluation.  75 year old white female, para 0, with profound mental retardation and seizure disorder, on no hormone replacement therapy, presents for her routine gynecologic evaluation [facility]. No recent history of vaginal bleeding, vaginal discharge, or pelvic pain.  The patient does wear depends for incontinence issues..     Gynecologic History No LMP recorded. Patient has had a hysterectomy. Contraception: menopause Last Pap: not needed Last mammogram: 11/2014 within normal limits  Obstetric History OB History  No data available    Past Medical History:  Diagnosis Date  . CAD (coronary artery disease)   . GERD (gastroesophageal reflux disease)   . History of hiatal hernia   . HTN (hypertension)   . Mental retardation   . Osteoarthritis   . Pneumococcus infection   . Seasonal allergies   . Seizure (HCC)   . Seizure [place](HCC)     Past Surgical History:  Procedure Laterality Date  . CHOLECYSTECTOMY      Current Outpatient Prescriptions on File Prior to Visit  Medication Sig Dispense Refill  . acetaminophen (TYLENOL) 325 MG tablet Take 1 tablet by mouth every 6 (six) hours.    Marland Kitchen. aspirin EC 81 MG tablet Take 1 tablet by mouth daily.    . calcium-vitamin D (CALCIUM 500/D) 500-200 MG-UNIT per tablet Take 1 tablet by mouth daily.    . cephALEXin (KEFLEX) 250 MG/5ML suspension Take 10 mLs (500 mg total) by mouth 2 (two) times daily. 200 mL 0  . cetirizine (ZYRTEC) 10 MG tablet Take 1 tablet by mouth daily as needed.    . cyanocobalamin (,VITAMIN B-12,) 1000 MCG/ML injection Inject 1 mL into the muscle every 30 (thirty) days.    Marland Kitchen. docusate sodium (STOOL SOFTENER) 100 MG capsule Take 1 capsule by mouth 2 (two) times daily.    Marland Kitchen. etodolac  (LODINE) 400 MG tablet Take 1 tablet by mouth 2 (two) times daily.    Marland Kitchen. gabapentin (NEURONTIN) 600 MG tablet Take 1 tablet by mouth 4 (four) times daily.    . hydrochlorothiazide (HYDRODIURIL) 25 MG tablet Take 1 tablet by mouth daily.    . hydrocortisone (ANUSOL-HC) 25 MG suppository Place 25 mg rectally 2 (two) times daily as needed for hemorrhoids or itching.    . isosorbide mononitrate (IMDUR) 60 MG 24 hr tablet Take 1 tablet by mouth daily.    . magnesium hydroxide (MILK OF MAGNESIA) 400 MG/5ML suspension Take 5 mLs by mouth as needed.    . metoprolol succinate (TOPROL-XL) 25 MG 24 hr tablet Take 1 tablet by mouth daily.    . nitroGLYCERIN (NITROSTAT) 0.4 MG SL tablet Place 1 tablet under the tongue as needed.    Marland Kitchen. omeprazole (PRILOSEC) 20 MG capsule Take 1 capsule by mouth daily.    Marland Kitchen. PHENObarbital 20 MG/5ML elixir Take 15 mLs by mouth at bedtime.    . phenytoin (DILANTIN) 100 MG ER capsule Take 260 mg by mouth daily.    Marland Kitchen. tolnaftate (TINACTIN) 1 % spray Apply 1 application topically 2 (two) times daily.    . traMADol (ULTRAM) 50 MG tablet Take 1 tablet (50 mg total) by mouth every 6 (six) hours as needed. 30 tablet 0  . triamcinolone cream (KENALOG) 0.1 % Apply 1 application topically.  No current facility-administered medications on file prior to visit.     Allergies  Allergen Reactions  . Depakote [Valproic Acid]   . Divalproex Sodium Other (See Comments)    Social History   Social History  . Marital status: Single    Spouse name: N/A  . Number of children: N/A  . Years of education: N/A   Occupational History  . Not on file.   Social History Main Topics  . Smoking status: Never Smoker  . Smokeless tobacco: Never Used  . Alcohol use No  . Drug use: No  . Sexual activity: No   Other Topics Concern  . Not on file   Social History Narrative  . No narrative on file    Family History  Problem Relation Age of Onset  . CAD Mother   . Congestive Heart Failure  Mother   . CVA Father   . Heart attack Brother   . Hypertension Sister   . Diabetes Sister     The following portions of the patient's history were reviewed and updated as appropriate: allergies, current medications, past family history, past medical history, past social history, past surgical history and problem list.  Review of Systems ROS unable to obtain due to mental retardation.  No issues voiced from attendants.   Objective:   There were no vitals taken for this visit. Physical Exam  Pleasant, cooperative white female with profound mental retardation. Skin-no evidence of rash or lesion. Head and neck-supple; no thyromegaly; no adenopathy. Lungs-clear. Heart-regular rate and rhythm. Breasts-no dominant mass, adenopathy, or nipple discharge. Abdomen-soft and without organomegaly; laparoscopic cholecystectomy scars are present and healed. Pelvic- External genitalia-normal. BUS-normal. Bimanual exam-deferred. Rectal-no skin changes. Peripheral vascular-skin warm and dry; no lesions or edema. Lymphatic-no inguinal lymphadenopathy.    Assessment:   Annual gynecologic examination 75 y.o. Contraception: menopause Obesity 1 Patient Active Problem List   Diagnosis Date Noted  . Acute respiratory failure with hypoxemia (HCC) 05/06/2015  . HTN (hypertension) 05/06/2015  . Seizure disorder (HCC) 05/06/2015  . CAD (coronary artery disease) 05/06/2015  . Osteoarthritis 05/06/2015  . GERD (gastroesophageal reflux disease) 05/06/2015  . HCAP (healthcare-associated pneumonia) 05/06/2015  . Abnormal ECG 06/24/2014  . 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 Dependent edema 06/06/2014     Plan:  Pap: Not needed Mammogram: Ordered Labs: per Dr. Dareen PianoAnderson Routine preventative health maintenance measures emphasized: Diet/Weight control, Tobacco Cessation and Alcohol/Drug use Return to Clinic - 1 Year   Darol Destinerystal Jotham Ahn, [place]  Note: This dictation was prepared with Dragon dictation along  with smaller phrase technology. Any transcriptional errors that result from this process are unintentional.

## 2016-09-06 ENCOUNTER — Encounter: Payer: Medicare Other | Admitting: Obstetrics and Gynecology

## 2016-10-11 ENCOUNTER — Encounter: Payer: Medicare Other | Admitting: Obstetrics and Gynecology

## 2016-10-31 DIAGNOSIS — F79 Unspecified intellectual disabilities: Secondary | ICD-10-CM | POA: Insufficient documentation

## 2016-11-01 NOTE — Progress Notes (Signed)
GYN ANNUAL PREVENTATIVE CARE ENCOUNTER NOTE  Subjective:       Hannah Vaughan is a 76 y.o. No obstetric history on file. female here for a routine annual gynecologic exam.  Current complaints:  1.  Annual gynecologic evaluation.  76 year old white female, para 0, with profound mental retardation and seizure disorder, on no hormone replacement therapy, presents for her routine gynecologic evaluation in referral from the [facility]. No recent history of vaginal bleeding, vaginal discharge, or pelvic pain.  The patient does wear depends for incontinence issues..   Patient was just started on Tamiflu for possible flu syndrome   Gynecologic History No LMP recorded. Patient has had a hysterectomy. Contraception: menopause Last Pap: not needed Last mammogram: 11/2015 within normal limits  Obstetric History OB History  No data available    Past Medical History:  Diagnosis Date  . CAD (coronary artery disease)   . GERD (gastroesophageal reflux disease)   . History of hiatal hernia   . HTN (hypertension)   . Mental retardation   . Osteoarthritis   . Pneumococcus infection   . Seasonal allergies   . Seizure (HCC)   . Seizure [facility])     Past Surgical History:  Procedure Laterality Date  . CHOLECYSTECTOMY      Current Outpatient Prescriptions on File Prior to Visit  Medication Sig Dispense Refill  . acetaminophen (TYLENOL) 325 MG tablet Take 1 tablet by mouth every 6 (six) hours.    Marland Kitchen aspirin EC 81 MG tablet Take 1 tablet by mouth daily.    . calcium-vitamin D (CALCIUM 500/D) 500-200 MG-UNIT per tablet Take 1 tablet by mouth daily.    . cephALEXin (KEFLEX) 250 MG/5ML suspension Take 10 mLs (500 mg total) by mouth 2 (two) times daily. 200 mL 0  . cetirizine (ZYRTEC) 10 MG tablet Take 1 tablet by mouth daily as needed.    . cyanocobalamin (,VITAMIN B-12,) 1000 MCG/ML injection Inject 1 mL into the muscle every 30 (thirty) days.    Marland Kitchen docusate sodium (STOOL SOFTENER) 100 MG  capsule Take 1 capsule by mouth 2 (two) times daily.    Marland Kitchen etodolac (LODINE) 400 MG tablet Take 1 tablet by mouth 2 (two) times daily.    Marland Kitchen gabapentin (NEURONTIN) 600 MG tablet Take 1 tablet by mouth 4 (four) times daily.    . hydrochlorothiazide (HYDRODIURIL) 25 MG tablet Take 1 tablet by mouth daily.    . hydrocortisone (ANUSOL-HC) 25 MG suppository Place 25 mg rectally 2 (two) times daily as needed for hemorrhoids or itching.    . isosorbide mononitrate (IMDUR) 60 MG 24 hr tablet Take 1 tablet by mouth daily.    . magnesium hydroxide (MILK OF MAGNESIA) 400 MG/5ML suspension Take 5 mLs by mouth as needed.    . metoprolol succinate (TOPROL-XL) 25 MG 24 hr tablet Take 1 tablet by mouth daily.    . nitroGLYCERIN (NITROSTAT) 0.4 MG SL tablet Place 1 tablet under the tongue as needed.    Marland Kitchen omeprazole (PRILOSEC) 20 MG capsule Take 1 capsule by mouth daily.    Marland Kitchen PHENObarbital 20 MG/5ML elixir Take 15 mLs by mouth at bedtime.    . phenytoin (DILANTIN) 100 MG ER capsule Take 260 mg by mouth daily.    Marland Kitchen tolnaftate (TINACTIN) 1 % spray Apply 1 application topically 2 (two) times daily.    . traMADol (ULTRAM) 50 MG tablet Take 1 tablet (50 mg total) by mouth every 6 (six) hours as needed. 30 tablet 0  .  triamcinolone cream (KENALOG) 0.1 % Apply 1 application topically.     No current facility-administered medications on file prior to visit.     Allergies  Allergen Reactions  . Depakote [Valproic Acid]   . Divalproex Sodium Other (See Comments)    Social History   Social History  . Marital status: Single    Spouse name: N/A  . Number of children: N/A  . Years of education: N/A   Occupational History  . Not on file.   Social History Main Topics  . Smoking status: Never Smoker  . Smokeless tobacco: Never Used  . Alcohol use No  . Drug use: No  . Sexual activity: No   Other Topics Concern  . Not on file   Social History Narrative  . No narrative on file    Family History  Problem  Relation Age of Onset  . CAD Mother   . Congestive Heart Failure Mother   . CVA Father   . Heart attack Brother   . Hypertension Sister   . Diabetes Sister     The following portions of the patient's history were reviewed and updated as appropriate: allergies, current medications, past family history, past medical history, past social history, past surgical history and problem list.  Review of Systems ROS unable to obtain due to mental retardation.  No issues voiced from attendants.   Objective:  BP 104/62   Pulse 76   Ht 4\' 11"  (1.499 m)   Wt 111 lb (50.3 kg)   BMI 22.42 kg/m    Physical Exam  Pleasant, cooperative white female with profound mental retardation. Skin-no evidence of rash or lesion. Head and neck-supple; no thyromegaly; no adenopathy. Lungs-Rhonchi and wheeze present Heart-regular rate and rhythm. Breasts-no dominant mass, adenopathy, or nipple discharge. Abdomen-soft and without organomegaly; laparoscopic cholecystectomy scars are present and healed. Pelvic- External genitalia-normal. BUS-normal. Bimanual exam-deferred. Rectal-no skin changes. Peripheral vascular-skin warm and dry; no lesions or edema. Lymphatic-no inguinal lymphadenopathy.    Assessment:   Annual gynecologic examination 76 y.o. Contraception: menopause Patient Active Problem List   Diagnosis Date Noted  . Acute respiratory failure with hypoxemia (HCC) 05/06/2015  . HTN (hypertension) 05/06/2015  . Seizure disorder (HCC) 05/06/2015  . CAD (coronary artery disease) 05/06/2015  . Osteoarthritis 05/06/2015  . GERD (gastroesophageal reflux disease) 05/06/2015  . HCAP (healthcare-associated pneumonia) 05/06/2015  . Abnormal ECG 06/24/2014  . 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 Dependent edema 06/06/2014   Possible flu syndrome, on Tamiflu Concern for respiratory complication based on clinical exam  Plan:  Pap: Not needed patient is status post hysterectomy. No vaginal complaints or  concerns by attendants. No Pap smear is needed Mammogram: scheduled 12/2016 Labs: per Dr. Dareen PianoAnderson Routine preventative health maintenance measures emphasized: Diet/Weight control, Tobacco Cessation and Alcohol/Drug use  Complete course of Tamiflu Chest x-ray, PA and lateral to rule out pneumonia Return to Clinic - 1 Year   Crystal AdellMiller, CMA  Herold HarmsMartin A Ariza Evans, MD   Note: This dictation was prepared with Dragon dictation along with smaller phrase technology. Any transcriptional errors that result from this process are unintentional.

## 2016-11-06 ENCOUNTER — Ambulatory Visit (INDEPENDENT_AMBULATORY_CARE_PROVIDER_SITE_OTHER): Payer: Medicare Other | Admitting: Obstetrics and Gynecology

## 2016-11-06 ENCOUNTER — Ambulatory Visit
Admission: RE | Admit: 2016-11-06 | Discharge: 2016-11-06 | Disposition: A | Discharge status: Home / Self Care | Payer: Medicare Other | Source: Ambulatory Visit | Attending: Obstetrics and Gynecology | Admitting: Obstetrics and Gynecology

## 2016-11-06 ENCOUNTER — Encounter: Payer: Self-pay | Admitting: Obstetrics and Gynecology

## 2016-11-06 VITALS — BP 104/62 | HR 76 | Ht 59.0 in | Wt 111.0 lb

## 2016-11-06 DIAGNOSIS — Z1231 Encounter for screening mammogram for malignant neoplasm of breast: Secondary | ICD-10-CM

## 2016-11-06 DIAGNOSIS — R918 Other nonspecific abnormal finding of lung field: Secondary | ICD-10-CM

## 2016-11-06 DIAGNOSIS — Z780 Asymptomatic menopausal state: Secondary | ICD-10-CM | POA: Diagnosis not present

## 2016-11-06 DIAGNOSIS — Z202 Contact with and (suspected) exposure to infections with a predominantly sexual mode of transmission: Secondary | ICD-10-CM

## 2016-11-06 DIAGNOSIS — I700 Atherosclerosis of aorta: Secondary | ICD-10-CM | POA: Insufficient documentation

## 2016-11-06 DIAGNOSIS — I517 Cardiomegaly: Secondary | ICD-10-CM | POA: Insufficient documentation

## 2016-11-06 DIAGNOSIS — Z1239 Encounter for other screening for malignant neoplasm of breast: Secondary | ICD-10-CM

## 2016-11-06 DIAGNOSIS — F79 Unspecified intellectual disabilities: Secondary | ICD-10-CM

## 2016-11-06 DIAGNOSIS — Z01419 Encounter for gynecological examination (general) (routine) without abnormal findings: Secondary | ICD-10-CM | POA: Diagnosis not present

## 2016-11-06 NOTE — Addendum Note (Signed)
Addended by: Marchelle FolksMILLER, Rylie Limburg G on: 11/06/2016 04:18 PM   Modules accepted: Orders

## 2016-11-06 NOTE — Patient Instructions (Signed)
1. No Pap smear is needed 2. Mammogram is scheduled 3. PA and lateral chest x-ray are ordered because of abnormal findings on clinical exam 4. Return in 1 year for follow-up 5. Complete course of Tamiflu   Health Maintenance for Postmenopausal Women Introduction Menopause is a normal process in which your reproductive ability comes to an end. This process happens gradually over a span of months to years, usually between the ages of 43 and 72. Menopause is complete when you have missed 12 consecutive menstrual periods. It is important to talk with your health care provider about some of the most common conditions that affect postmenopausal women, such as heart disease, cancer, and bone loss (osteoporosis). Adopting a healthy lifestyle and getting preventive care can help to promote your health and wellness. Those actions can also lower your chances of developing some of these common conditions. What should I know about menopause? During menopause, you may experience a number of symptoms, such as:  Moderate-to-severe hot flashes.  Night sweats.  Decrease in sex drive.  Mood swings.  Headaches.  Tiredness.  Irritability.  Memory problems.  Insomnia. Choosing to treat or not to treat menopausal changes is an individual decision that you make with your health care provider. What should I know about hormone replacement therapy and supplements? Hormone therapy products are effective for treating symptoms that are associated with menopause, such as hot flashes and night sweats. Hormone replacement carries certain risks, especially as you become older. If you are thinking about using estrogen or estrogen with progestin treatments, discuss the benefits and risks with your health care provider. What should I know about heart disease and stroke? Heart disease, heart attack, and stroke become more likely as you age. This may be due, in part, to the hormonal changes that your body experiences  during menopause. These can affect how your body processes dietary fats, triglycerides, and cholesterol. Heart attack and stroke are both medical emergencies. There are many things that you can do to help prevent heart disease and stroke:  Have your blood pressure checked at least every 1-2 years. High blood pressure causes heart disease and increases the risk of stroke.  If you are 31-37 years old, ask your health care provider if you should take aspirin to prevent a heart attack or a stroke.  Do not use any tobacco products, including cigarettes, chewing tobacco, or electronic cigarettes. If you need help quitting, ask your health care provider.  It is important to eat a healthy diet and maintain a healthy weight.  Be sure to include plenty of vegetables, fruits, low-fat dairy products, and lean protein.  Avoid eating foods that are high in solid fats, added sugars, or salt (sodium).  Get regular exercise. This is one of the most important things that you can do for your health.  Try to exercise for at least 150 minutes each week. The type of exercise that you do should increase your heart rate and make you sweat. This is known as moderate-intensity exercise.  Try to do strengthening exercises at least twice each week. Do these in addition to the moderate-intensity exercise.  Know your numbers.Ask your health care provider to check your cholesterol and your blood glucose. Continue to have your blood tested as directed by your health care provider. What should I know about cancer screening? There are several types of cancer. Take the following steps to reduce your risk and to catch any cancer development as early as possible. Breast Cancer  Practice breast  self-awareness.  This means understanding how your breasts normally appear and feel.  It also means doing regular breast self-exams. Let your health care provider know about any changes, no matter how small.  If you are 60 or  older, have a clinician do a breast exam (clinical breast exam or CBE) every year. Depending on your age, family history, and medical history, it may be recommended that you also have a yearly breast X-ray (mammogram).  If you have a family history of breast cancer, talk with your health care provider about genetic screening.  If you are at high risk for breast cancer, talk with your health care provider about having an MRI and a mammogram every year.  Breast cancer (BRCA) gene test is recommended for women who have family members with BRCA-related cancers. Results of the assessment will determine the need for genetic counseling and BRCA1 and for BRCA2 testing. BRCA-related cancers include these types:  Breast. This occurs in males or females.  Ovarian.  Tubal. This may also be called fallopian tube cancer.  Cancer of the abdominal or pelvic lining (peritoneal cancer).  Prostate.  Pancreatic. Cervical, Uterine, and Ovarian Cancer  Your health care provider may recommend that you be screened regularly for cancer of the pelvic organs. These include your ovaries, uterus, and vagina. This screening involves a pelvic exam, which includes checking for microscopic changes to the surface of your cervix (Pap test).  For women ages 21-65, health care providers may recommend a pelvic exam and a Pap test every three years. For women ages 98-65, they may recommend the Pap test and pelvic exam, combined with testing for human papilloma virus (HPV), every five years. Some types of HPV increase your risk of cervical cancer. Testing for HPV may also be done on women of any age who have unclear Pap test results.  Other health care providers may not recommend any screening for nonpregnant women who are considered low risk for pelvic cancer and have no symptoms. Ask your health care provider if a screening pelvic exam is right for you.  If you have had past treatment for cervical cancer or a condition that  could lead to cancer, you need Pap tests and screening for cancer for at least 20 years after your treatment. If Pap tests have been discontinued for you, your risk factors (such as having a new sexual partner) need to be reassessed to determine if you should start having screenings again. Some women have medical problems that increase the chance of getting cervical cancer. In these cases, your health care provider may recommend that you have screening and Pap tests more often.  If you have a family history of uterine cancer or ovarian cancer, talk with your health care provider about genetic screening.  If you have vaginal bleeding after reaching menopause, tell your health care provider.  There are currently no reliable tests available to screen for ovarian cancer. Lung Cancer  Lung cancer screening is recommended for adults 45-16 years old who are at high risk for lung cancer because of a history of smoking. A yearly low-dose CT scan of the lungs is recommended if you:  Currently smoke.  Have a history of at least 30 pack-years of smoking and you currently smoke or have quit within the past 15 years. A pack-year is smoking an average of one pack of cigarettes per day for one year. Yearly screening should:  Continue until it has been 15 years since you quit.  Stop if you  develop a health problem that would prevent you from having lung cancer treatment. Colorectal Cancer  This type of cancer can be detected and can often be prevented.  Routine colorectal cancer screening usually begins at age 66 and continues through age 26.  If you have risk factors for colon cancer, your health care provider may recommend that you be screened at an earlier age.  If you have a family history of colorectal cancer, talk with your health care provider about genetic screening.  Your health care provider may also recommend using home test kits to check for hidden blood in your stool.  A small camera at the  end of a tube can be used to examine your colon directly (sigmoidoscopy or colonoscopy). This is done to check for the earliest forms of colorectal cancer.  Direct examination of the colon should be repeated every 5-10 years until age 29. However, if early forms of precancerous polyps or small growths are found or if you have a family history or genetic risk for colorectal cancer, you may need to be screened more often. Skin Cancer  Check your skin from head to toe regularly.  Monitor any moles. Be sure to tell your health care provider:  About any new moles or changes in moles, especially if there is a change in a mole's shape or color.  If you have a mole that is larger than the size of a pencil eraser.  If any of your family members has a history of skin cancer, especially at a young age, talk with your health care provider about genetic screening.  Always use sunscreen. Apply sunscreen liberally and repeatedly throughout the day.  Whenever you are outside, protect yourself by wearing long sleeves, pants, a wide-brimmed hat, and sunglasses. What should I know about osteoporosis? Osteoporosis is a condition in which bone destruction happens more quickly than new bone creation. After menopause, you may be at an increased risk for osteoporosis. To help prevent osteoporosis or the bone fractures that can happen because of osteoporosis, the following is recommended:  If you are 70-43 years old, get at least 1,000 mg of calcium and at least 600 mg of vitamin D per day.  If you are older than age 56 but younger than age 61, get at least 1,200 mg of calcium and at least 600 mg of vitamin D per day.  If you are older than age 58, get at least 1,200 mg of calcium and at least 800 mg of vitamin D per day. Smoking and excessive alcohol intake increase the risk of osteoporosis. Eat foods that are rich in calcium and vitamin D, and do weight-bearing exercises several times each week as directed by  your health care provider. What should I know about how menopause affects my mental health? Depression may occur at any age, but it is more common as you become older. Common symptoms of depression include:  Low or sad mood.  Changes in sleep patterns.  Changes in appetite or eating patterns.  Feeling an overall lack of motivation or enjoyment of activities that you previously enjoyed.  Frequent crying spells. Talk with your health care provider if you think that you are experiencing depression. What should I know about immunizations? It is important that you get and maintain your immunizations. These include:  Tetanus, diphtheria, and pertussis (Tdap) booster vaccine.  Influenza every year before the flu season begins.  Pneumonia vaccine.  Shingles vaccine. Your health care provider may also recommend other immunizations. This  information is not intended to replace advice given to you by your health care provider. Make sure you discuss any questions you have with your health care provider. Document Released: 10/26/2005 Document Revised: 03/23/2016 Document Reviewed: 06/07/2015  2017 Elsevier

## 2016-11-07 ENCOUNTER — Encounter: Payer: Medicare Other | Admitting: Obstetrics and Gynecology

## 2016-11-07 ENCOUNTER — Telehealth: Payer: Self-pay

## 2016-11-07 MED ORDER — LEVOFLOXACIN 500 MG PO TABS: 500.0000 mg | ORAL_TABLET | Freq: Every day | ORAL | 0 refills | Status: DC

## 2016-11-07 NOTE — Addendum Note (Signed)
Addended by: Marchelle FolksMILLER, Kenlea Woodell G on: 11/07/2016 09:15 AM   Modules accepted: Orders

## 2016-11-07 NOTE — Telephone Encounter (Signed)
-----   Message from Herold HarmsMartin A Defrancesco, MD sent at 11/07/2016  6:47 AM EST ----- Please notify - Abnormal Labs Possible early CAP Recommend Levofloxacin 500 mg daily x 7 days

## 2016-11-07 NOTE — Telephone Encounter (Signed)
Hannah Vaughan at group home aware. Meds erx.

## 2016-11-10 LAB — GC/CHLAMYDIA PROBE AMP
Chlamydia trachomatis, NAA: NEGATIVE
Neisseria gonorrhoeae by PCR: NEGATIVE

## 2016-11-26 ENCOUNTER — Inpatient Hospital Stay: Payer: Medicare Other

## 2016-11-26 ENCOUNTER — Emergency Department: Payer: Medicare Other

## 2016-11-26 ENCOUNTER — Inpatient Hospital Stay
Admission: EM | Admit: 2016-11-26 | Discharge: 2016-11-28 | DRG: 812 | Disposition: A | Discharge status: Home / Self Care | Payer: Medicare Other | Attending: Internal Medicine | Admitting: Internal Medicine

## 2016-11-26 DIAGNOSIS — K219 Gastro-esophageal reflux disease without esophagitis: Secondary | ICD-10-CM | POA: Diagnosis present

## 2016-11-26 DIAGNOSIS — Z888 Allergy status to other drugs, medicaments and biological substances status: Secondary | ICD-10-CM | POA: Diagnosis not present

## 2016-11-26 DIAGNOSIS — I2510 Atherosclerotic heart disease of native coronary artery without angina pectoris: Secondary | ICD-10-CM | POA: Diagnosis present

## 2016-11-26 DIAGNOSIS — I493 Ventricular premature depolarization: Secondary | ICD-10-CM | POA: Diagnosis present

## 2016-11-26 DIAGNOSIS — R0789 Other chest pain: Secondary | ICD-10-CM | POA: Diagnosis present

## 2016-11-26 DIAGNOSIS — M1990 Unspecified osteoarthritis, unspecified site: Secondary | ICD-10-CM | POA: Diagnosis present

## 2016-11-26 DIAGNOSIS — R4189 Other symptoms and signs involving cognitive functions and awareness: Secondary | ICD-10-CM | POA: Diagnosis present

## 2016-11-26 DIAGNOSIS — I110 Hypertensive heart disease with heart failure: Secondary | ICD-10-CM | POA: Diagnosis present

## 2016-11-26 DIAGNOSIS — Z79899 Other long term (current) drug therapy: Secondary | ICD-10-CM

## 2016-11-26 DIAGNOSIS — F0390 Unspecified dementia without behavioral disturbance: Secondary | ICD-10-CM | POA: Diagnosis present

## 2016-11-26 DIAGNOSIS — Z9049 Acquired absence of other specified parts of digestive tract: Secondary | ICD-10-CM | POA: Diagnosis not present

## 2016-11-26 DIAGNOSIS — E876 Hypokalemia: Secondary | ICD-10-CM | POA: Diagnosis present

## 2016-11-26 DIAGNOSIS — Z833 Family history of diabetes mellitus: Secondary | ICD-10-CM | POA: Diagnosis not present

## 2016-11-26 DIAGNOSIS — I214 Non-ST elevation (NSTEMI) myocardial infarction: Secondary | ICD-10-CM | POA: Diagnosis present

## 2016-11-26 DIAGNOSIS — D510 Vitamin B12 deficiency anemia due to intrinsic factor deficiency: Principal | ICD-10-CM | POA: Diagnosis present

## 2016-11-26 DIAGNOSIS — I252 Old myocardial infarction: Secondary | ICD-10-CM

## 2016-11-26 DIAGNOSIS — Z823 Family history of stroke: Secondary | ICD-10-CM | POA: Diagnosis not present

## 2016-11-26 DIAGNOSIS — N390 Urinary tract infection, site not specified: Secondary | ICD-10-CM | POA: Diagnosis present

## 2016-11-26 DIAGNOSIS — Z7982 Long term (current) use of aspirin: Secondary | ICD-10-CM

## 2016-11-26 DIAGNOSIS — Z8249 Family history of ischemic heart disease and other diseases of the circulatory system: Secondary | ICD-10-CM | POA: Diagnosis not present

## 2016-11-26 DIAGNOSIS — F79 Unspecified intellectual disabilities: Secondary | ICD-10-CM | POA: Diagnosis present

## 2016-11-26 DIAGNOSIS — R7989 Other specified abnormal findings of blood chemistry: Secondary | ICD-10-CM | POA: Diagnosis present

## 2016-11-26 DIAGNOSIS — I5022 Chronic systolic (congestive) heart failure: Secondary | ICD-10-CM | POA: Diagnosis present

## 2016-11-26 DIAGNOSIS — D649 Anemia, unspecified: Secondary | ICD-10-CM

## 2016-11-26 LAB — APTT: aPTT: 29 seconds (ref 24–36)

## 2016-11-26 LAB — COMPREHENSIVE METABOLIC PANEL
ALBUMIN: 2.3 g/dL — AB (ref 3.5–5.0)
ALT: 7 U/L — AB (ref 14–54)
AST: 15 U/L (ref 15–41)
Alkaline Phosphatase: 121 U/L (ref 38–126)
Anion gap: 7 (ref 5–15)
BILIRUBIN TOTAL: 0.4 mg/dL (ref 0.3–1.2)
BUN: 25 mg/dL — AB (ref 6–20)
CO2: 28 mmol/L (ref 22–32)
CREATININE: 0.66 mg/dL (ref 0.44–1.00)
Calcium: 8.3 mg/dL — ABNORMAL LOW (ref 8.9–10.3)
Chloride: 106 mmol/L (ref 101–111)
GFR calc Af Amer: >60 mL/min (ref >60)
GLUCOSE: 100 mg/dL — AB (ref 65–99)
POTASSIUM: 2.9 mmol/L — AB (ref 3.5–5.1)
Sodium: 141 mmol/L (ref 135–145)
TOTAL PROTEIN: 5.9 g/dL — AB (ref 6.5–8.1)

## 2016-11-26 LAB — URINALYSIS, ROUTINE W REFLEX MICROSCOPIC
Glucose, UA: NEGATIVE mg/dL
KETONES UR: NEGATIVE mg/dL
NITRITE: NEGATIVE
PH: 6.5 (ref 5.0–8.0)
Protein, ur: 100 mg/dL — AB
SPECIFIC GRAVITY, URINE: 1.025 (ref 1.005–1.030)

## 2016-11-26 LAB — CBC
HCT: 22.8 % — ABNORMAL LOW (ref 35.0–47.0)
HEMOGLOBIN: 7.1 g/dL — AB (ref 12.0–16.0)
MCH: 20.2 pg — AB (ref 26.0–34.0)
MCHC: 30.9 g/dL — AB (ref 32.0–36.0)
MCV: 65.3 fL — ABNORMAL LOW (ref 80.0–100.0)
Platelets: 312 10*3/uL (ref 150–440)
RBC: 3.5 MIL/uL — ABNORMAL LOW (ref 3.80–5.20)
RDW: 20.2 % — AB (ref 11.5–14.5)
WBC: 5.4 10*3/uL (ref 3.6–11.0)

## 2016-11-26 LAB — PROTIME-INR
INR: 1.04
PROTHROMBIN TIME: 13.6 s (ref 11.4–15.2)

## 2016-11-26 LAB — PREPARE RBC (CROSSMATCH)

## 2016-11-26 LAB — URINALYSIS, MICROSCOPIC (REFLEX)

## 2016-11-26 LAB — MAGNESIUM: Magnesium: 1.7 mg/dL (ref 1.7–2.4)

## 2016-11-26 LAB — IRON AND TIBC
Iron: 13 ug/dL — ABNORMAL LOW (ref 28–170)
Saturation Ratios: 3 % — ABNORMAL LOW (ref 10.4–31.8)
TIBC: 434 ug/dL (ref 250–450)
UIBC: 421 ug/dL

## 2016-11-26 LAB — FERRITIN: FERRITIN: 7 ng/mL — AB (ref 11–307)

## 2016-11-26 LAB — TROPONIN I
TROPONIN I: 0.19 ng/mL — AB (ref <0.03)
Troponin I: 0.19 ng/mL (ref <0.03)

## 2016-11-26 LAB — ABO/RH: ABO/RH(D): O POS

## 2016-11-26 LAB — HEMOGLOBIN: Hemoglobin: 7.3 g/dL — ABNORMAL LOW (ref 12.0–16.0)

## 2016-11-26 MED ORDER — HEPARIN (PORCINE) IN NACL 100-0.45 UNIT/ML-% IJ SOLN
600.0000 [IU]/h | INTRAMUSCULAR | Status: DC
  Administered 2016-11-26: 600 [IU]/h via INTRAVENOUS
  Filled 2016-11-26: qty 250

## 2016-11-26 MED ORDER — PANTOPRAZOLE SODIUM 40 MG PO TBEC
40.0000 mg | DELAYED_RELEASE_TABLET | Freq: Every day | ORAL | Status: DC
  Administered 2016-11-27 – 2016-11-28 (×2): 40 mg via ORAL
  Filled 2016-11-26 (×2): qty 1

## 2016-11-26 MED ORDER — BISACODYL 10 MG RE SUPP
10.0000 mg | Freq: Every day | RECTAL | Status: DC | PRN
  Filled 2016-11-26: qty 1

## 2016-11-26 MED ORDER — ACETAMINOPHEN 325 MG PO TABS: 650.0000 mg | ORAL_TABLET | Freq: Four times a day (QID) | ORAL | Status: DC | PRN

## 2016-11-26 MED ORDER — SODIUM CHLORIDE 0.9 % IV SOLN
30.0000 meq | Freq: Once | INTRAVENOUS | Status: AC
  Administered 2016-11-26: 30 meq via INTRAVENOUS
  Filled 2016-11-26: qty 15

## 2016-11-26 MED ORDER — HEPARIN BOLUS VIA INFUSION
3000.0000 [IU] | Freq: Once | INTRAVENOUS | Status: AC
Start: 2016-11-26 — End: 2016-11-26
  Administered 2016-11-26: 3000 [IU] via INTRAVENOUS
  Filled 2016-11-26: qty 3000

## 2016-11-26 MED ORDER — LOPERAMIDE HCL 2 MG PO CAPS: 2.0000 mg | ORAL_CAPSULE | ORAL | Status: DC | PRN

## 2016-11-26 MED ORDER — PHENYTOIN SODIUM EXTENDED 100 MG PO CAPS
300.0000 mg | ORAL_CAPSULE | Freq: Every day | ORAL | Status: DC
  Administered 2016-11-26 – 2016-11-27 (×2): 300 mg via ORAL
  Filled 2016-11-26 (×2): qty 3

## 2016-11-26 MED ORDER — ONDANSETRON HCL 4 MG PO TABS
4.0000 mg | ORAL_TABLET | Freq: Four times a day (QID) | ORAL | Status: DC | PRN
Start: 2016-11-26 — End: 2016-11-28

## 2016-11-26 MED ORDER — ASPIRIN 300 MG RE SUPP
300.0000 mg | Freq: Once | RECTAL | Status: AC
  Administered 2016-11-26: 300 mg via RECTAL
  Filled 2016-11-26: qty 1

## 2016-11-26 MED ORDER — ISOSORBIDE MONONITRATE ER 60 MG PO TB24
60.0000 mg | ORAL_TABLET | Freq: Every day | ORAL | Status: DC
  Administered 2016-11-27 – 2016-11-28 (×2): 60 mg via ORAL
  Filled 2016-11-26 (×2): qty 1

## 2016-11-26 MED ORDER — ONDANSETRON HCL 4 MG/2ML IJ SOLN: 4.0000 mg | Freq: Four times a day (QID) | INTRAMUSCULAR | Status: DC | PRN

## 2016-11-26 MED ORDER — METOPROLOL SUCCINATE ER 25 MG PO TB24
25.0000 mg | ORAL_TABLET | Freq: Every day | ORAL | Status: DC
  Administered 2016-11-27 – 2016-11-28 (×2): 25 mg via ORAL
  Filled 2016-11-26 (×2): qty 1

## 2016-11-26 MED ORDER — POTASSIUM CHLORIDE 20 MEQ PO PACK: 40.0000 meq | PACK | Freq: Once | ORAL | Status: DC

## 2016-11-26 MED ORDER — PHENOBARBITAL 20 MG/5ML PO ELIX
60.0000 mg | ORAL_SOLUTION | Freq: Every day | ORAL | Status: DC
  Administered 2016-11-26 – 2016-11-27 (×2): 60 mg via ORAL
  Filled 2016-11-26 (×4): qty 15

## 2016-11-26 MED ORDER — ASPIRIN EC 81 MG PO TBEC
81.0000 mg | DELAYED_RELEASE_TABLET | Freq: Every day | ORAL | Status: DC
  Administered 2016-11-27 – 2016-11-28 (×2): 81 mg via ORAL
  Filled 2016-11-26 (×2): qty 1

## 2016-11-26 MED ORDER — ACETAMINOPHEN 650 MG RE SUPP: 650.0000 mg | Freq: Four times a day (QID) | RECTAL | Status: DC | PRN

## 2016-11-26 MED ORDER — ALBUTEROL SULFATE (2.5 MG/3ML) 0.083% IN NEBU: 2.5000 mg | INHALATION_SOLUTION | RESPIRATORY_TRACT | Status: DC | PRN

## 2016-11-26 MED ORDER — HEPARIN SODIUM (PORCINE) 5000 UNIT/ML IJ SOLN: 4000.0000 [IU] | Freq: Once | INTRAMUSCULAR | Status: DC

## 2016-11-26 MED ORDER — ASPIRIN 81 MG PO CHEW: 324.0000 mg | CHEWABLE_TABLET | Freq: Once | ORAL | Status: DC

## 2016-11-26 MED ORDER — SODIUM CHLORIDE 0.9% FLUSH
3.0000 mL | Freq: Two times a day (BID) | INTRAVENOUS | Status: DC
  Administered 2016-11-27 – 2016-11-28 (×2): 3 mL via INTRAVENOUS

## 2016-11-26 MED ORDER — SODIUM CHLORIDE 0.9 % IV BOLUS (SEPSIS): 500.0000 mL | Freq: Once | INTRAVENOUS | Status: DC

## 2016-11-26 MED ORDER — GABAPENTIN 600 MG PO TABS
600.0000 mg | ORAL_TABLET | Freq: Four times a day (QID) | ORAL | Status: DC
  Administered 2016-11-26 – 2016-11-28 (×8): 600 mg via ORAL
  Filled 2016-11-26 (×8): qty 1

## 2016-11-26 MED ORDER — DOCUSATE SODIUM 100 MG PO CAPS
100.0000 mg | ORAL_CAPSULE | Freq: Two times a day (BID) | ORAL | Status: DC
  Administered 2016-11-26 – 2016-11-28 (×4): 100 mg via ORAL
  Filled 2016-11-26 (×4): qty 1

## 2016-11-26 MED ORDER — ONDANSETRON HCL 4 MG/2ML IJ SOLN
4.0000 mg | INTRAMUSCULAR | Status: AC
  Administered 2016-11-26: 4 mg via INTRAVENOUS
  Filled 2016-11-26: qty 2

## 2016-11-26 MED ORDER — MORPHINE SULFATE (PF) 2 MG/ML IV SOLN
2.0000 mg | Freq: Once | INTRAVENOUS | Status: AC
  Administered 2016-11-26: 2 mg via INTRAVENOUS
  Filled 2016-11-26: qty 1

## 2016-11-26 MED ORDER — POLYETHYLENE GLYCOL 3350 17 G PO PACK: 17.0000 g | PACK | Freq: Every day | ORAL | Status: DC | PRN

## 2016-11-26 MED ORDER — GUAIFENESIN 100 MG/5ML PO SOLN
5.0000 mL | ORAL | Status: DC | PRN
  Filled 2016-11-26: qty 5

## 2016-11-26 MED ORDER — SODIUM CHLORIDE 0.9 % IV SOLN: Freq: Once | INTRAVENOUS | Status: DC

## 2016-11-26 MED ORDER — LORAZEPAM 0.5 MG PO TABS
0.5000 mg | ORAL_TABLET | Freq: Three times a day (TID) | ORAL | Status: DC
  Administered 2016-11-26 – 2016-11-28 (×5): 0.5 mg via ORAL
  Filled 2016-11-26 (×5): qty 1

## 2016-11-26 MED ORDER — HYDROCORTISONE ACE-PRAMOXINE 1-1 % RE FOAM
1.0000 | Freq: Two times a day (BID) | RECTAL | Status: DC | PRN
  Filled 2016-11-26: qty 10

## 2016-11-26 NOTE — Progress Notes (Signed)
Unable to do pt admission profile as pt is non verbal and no family member present. attemps made to contact brother in order to obtain consent for blood transfusion. Messages left on his voice mail. Heparin and potassium infusing well.

## 2016-11-26 NOTE — H&P (Signed)
[facility]   PATIENT NAME: Hannah BalDoris Vaughan    MR#:  [id number]  DATE OF BIRTH:  24-Sep-1940  DATE OF ADMISSION:  11/26/2016  PRIMARY CARE PHYSICIAN: Lauro RegulusANDERSON,MARSHALL W., MD   REQUESTING/REFERRING PHYSICIAN: Dr. York CeriseForbach  CHIEF COMPLAINT:   Chief Complaint  Patient presents with  . Chest Pain    HISTORY OF PRESENT ILLNESS:  Hannah Vaughan  is a 76 y.o. female with a known history of CAD, hypertension, cognitive impairment, chronic anemia presents to the emergency room from group home where she complained of chest pain. Patient does have history of CAD. Unknown date of last cardiac catheterization. [facility] Here patient has been found to have troponin of 0.19 and is being admitted for non-ST elevation MI.  Incidental finding of also hemoglobin 7.1. No history of bleeding. No prior history of GI bleed.   PAST MEDICAL HISTORY:   Past Medical History:  Diagnosis Date  . CAD (coronary artery disease)   . GERD (gastroesophageal reflux disease)   . History of hiatal hernia   . HTN (hypertension)   . Mental retardation   . Osteoarthritis   . Pneumococcus infection   . Seasonal allergies   . Seizure (HCC)   . Seizure (HCC)     PAST SURGICAL HISTORY:   Past Surgical History:  Procedure Laterality Date  . CHOLECYSTECTOMY      SOCIAL HISTORY:   Social History  Substance Use Topics  . Smoking status: Never Smoker  . Smokeless tobacco: Never Used  . Alcohol use No    FAMILY HISTORY:   Family History  Problem Relation Age of Onset  . CAD Mother   . Congestive Heart Failure Mother   . CVA Father   . Heart attack Brother   . Hypertension Sister   . Diabetes Sister     DRUG ALLERGIES:   Allergies  Allergen Reactions  . Depakote [Valproic Acid]   . Divalproex Sodium Other (See Comments)    REVIEW OF SYSTEMS:   Review of Systems  Unable to perform ROS: Mental acuity    MEDICATIONS AT HOME:    Prior to Admission medications   Medication Sig Start Date End Date Taking? Authorizing Provider  acetaminophen (TYLENOL) 325 MG tablet Take 1 tablet by mouth every 6 (six) hours.   Yes Historical Provider, MD  Aluminum & Magnesium Hydroxide (MALDROXAL ANTACID PO) Take by mouth as needed.   Yes Historical Provider, MD  aspirin EC 81 MG tablet Take 1 tablet by mouth daily. 11/11/14  Yes Historical Provider, MD  calcium-vitamin D (CALCIUM 500/D) 500-200 MG-UNIT per tablet Take 1 tablet by mouth 2 (two) times daily.    Yes Historical Provider, MD  carbamide peroxide (DEBROX) 6.5 % otic solution Place 5 drops into both ears every 30 (thirty) days.   Yes Historical Provider, MD  cetirizine (ZYRTEC) 10 MG tablet Take 1 tablet by mouth daily as needed. 03/25/14  Yes Historical Provider, MD  cyanocobalamin (,VITAMIN B-12,) 1000 MCG/ML injection Inject 1 mL into the muscle every 30 (thirty) days.   Yes Historical Provider, MD  docusate sodium (STOOL SOFTENER) 100 MG capsule Take 1 capsule by mouth 2 (two) times daily.   Yes Historical Provider, MD  gabapentin (NEURONTIN) 600 MG tablet Take 1 tablet by mouth 4 (four) times daily.  06/11/14  Yes Historical Provider, MD  guaiFENesin (ROBITUSSIN) 100 MG/5ML SOLN Take 5 mLs by mouth every 4 (four) hours as needed for  cough or to loosen phlegm.   Yes Historical Provider, MD  hydrochlorothiazide (HYDRODIURIL) 25 MG tablet Take 1 tablet by mouth daily.   Yes Historical Provider, MD  hydrocortisone (ANUSOL-HC) 25 MG suppository Place 25 mg rectally 2 (two) times daily as needed for hemorrhoids or itching.   Yes Historical Provider, MD  hydrocortisone-pramoxine [facility]) rectal foam Place 1 applicator rectally 2 (two) times daily.   Yes Historical Provider, MD  isosorbide mononitrate (IMDUR) 60 MG 24 hr tablet Take 1 tablet by mouth daily. 02/11/14  Yes Historical Provider, MD  loperamide (IMODIUM) 2 MG capsule Take 2 mg by mouth as needed for diarrhea or loose  stools.   Yes Historical Provider, MD  magnesium hydroxide (MILK OF MAGNESIA) 400 MG/5ML suspension Take 5 mLs by mouth as needed.   Yes Historical Provider, MD  meloxicam (MOBIC) 7.5 MG tablet Take 7.5 mg by mouth daily.   Yes Historical Provider, MD  metoprolol succinate (TOPROL-XL) 25 MG 24 hr tablet Take 1 tablet by mouth daily.   Yes Historical Provider, MD  miconazole (MICOTIN) 2 % powder Apply 1 application topically 2 (two) times daily.   Yes Historical Provider, MD  mupirocin ointment (BACTROBAN) 2 % Place 1 application into the nose 3 (three) times daily.   Yes Historical Provider, MD  nitroGLYCERIN (NITROSTAT) 0.4 MG SL tablet Place 1 tablet under the tongue as needed. 04/06/15  Yes Historical Provider, MD  nystatin (NYSTATIN) powder Apply 1 g topically as needed.   Yes Historical Provider, MD  omeprazole (PRILOSEC) 20 MG capsule Take 1 capsule by mouth daily. 01/06/15  Yes Historical Provider, MD  PHENObarbital 20 MG/5ML elixir Take 15 mLs by mouth at bedtime.   Yes Historical Provider, MD  phenytoin (DILANTIN) 100 MG ER capsule Take 300 mg by mouth at bedtime.    Yes Historical Provider, MD  Skin Protectants, Misc. (EUCERIN) cream Apply 1 application topically daily.   Yes Historical Provider, MD  terbinafine (LAMISIL) 1 % cream Apply 1 application topically 2 (two) times daily.   Yes Historical Provider, MD  tolnaftate (TINACTIN) 1 % spray Apply 1 application topically 2 (two) times daily.   Yes Historical Provider, MD  traMADol (ULTRAM) 50 MG tablet Take 1 tablet (50 mg total) by mouth every 6 (six) hours as needed. 05/09/15  Yes Enid Baas, MD  triamcinolone cream (KENALOG) 0.1 % Apply 1 application topically 2 (two) times daily.    Yes Historical Provider, MD  levofloxacin (LEVAQUIN) 500 MG tablet Take 1 tablet (500 mg total) by mouth daily. Patient not taking: Reported on 11/26/2016 11/07/16   Prentice Docker Defrancesco, MD  LORazepam (ATIVAN) 0.5 MG tablet Take 0.5 mg by mouth every  8 (eight) hours.    Historical Provider, MD  phenytoin (DILANTIN) 100 MG ER capsule Take 260 mg by mouth daily.    Historical Provider, MD     VITAL SIGNS:  Blood pressure 102/64, pulse 86, temperature (!) 96.5 F (35.8 C), temperature source Axillary, resp. rate 18, weight 49.9 kg (110 lb), SpO2 94 %.  PHYSICAL EXAMINATION:  Physical Exam  GENERAL:  76 y.o.-year-old patient lying in the bed with no acute distress.  EYES: Pupils equal, round, reactive to light and accommodation. No scleral icterus. Extraocular muscles intact.  HEENT: Head atraumatic, normocephalic. Oropharynx and nasopharynx clear. No oropharyngeal erythema, moist oral mucosa  NECK:  Supple, no jugular venous distention. No thyroid enlargement, no tenderness.  LUNGS: Normal breath sounds bilaterally, no wheezing, rales, rhonchi. No use of accessory  muscles of respiration.  CARDIOVASCULAR: S1, S2 normal. No murmurs, rubs, or gallops.  ABDOMEN: Soft, nontender, nondistended. Bowel sounds present. No organomegaly or mass.  EXTREMITIES: No pedal edema, cyanosis, or clubbing. + 2 pedal & radial pulses b/l.   NEUROLOGIC: Moves all 4 extremities PSYCHIATRIC: The patient is alert and awake. Restless. Nonverbal. SKIN: No obvious rash, lesion, or ulcer.   LABORATORY PANEL:   CBC  Recent Labs Lab 11/26/16 1318  WBC 5.4  HGB 7.1*  HCT 22.8*  PLT 312   ------------------------------------------------------------------------------------------------------------------  Chemistries   Recent Labs Lab 11/26/16 1318  NA 141  K 2.9*  CL 106  CO2 28  GLUCOSE 100*  BUN 25*  CREATININE 0.66  CALCIUM 8.3*  AST 15  ALT 7*  ALKPHOS 121  BILITOT 0.4   ------------------------------------------------------------------------------------------------------------------  Cardiac Enzymes  Recent Labs Lab 11/26/16 1318  TROPONINI 0.19*    ------------------------------------------------------------------------------------------------------------------  RADIOLOGY:  No results found.   IMPRESSION AND PLAN:   * NSTEMI Aspirin, statin, beta blocker. Heparin drip. Telemetry monitoring. Consult cardiology for cardiac catheterization. Repeat troponin.  * Acute on chronic anemia Patient has not had any acute blood loss as per history. Check stool for Hemoccult. We will transfuse 1 unit packed RBC due to her acute MI. Called available numbers in her chart to contact her brother. But unable to. She definitely needs this blood urgently. Ordered blood without consent due to the emergency.  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 Stable. No signs of fluid overload.  * Cognitive impairment  * DVT prophylaxis On heparin drip   All the records are reviewed and case discussed with ED provider. Management plans discussed with the patient, family and they are in agreement.  CODE STATUS: FULL CODE  TOTAL CC TIME TAKING CARE OF THIS PATIENT: 40 minutes.   Milagros Loll R M.D on 11/26/2016 at 3:05 PM  Between 7am to 6pm - Pager - [phone]  After 6pm go to [email or web address] - password [email or web address] [facility]  [facility]  Office  [phone]  CC: Primary care physician; Lauro Regulus., MD  Note: This dictation was prepared with Dragon dictation along with smaller phrase technology. Any transcriptional errors that result from this process are unintentional.

## 2016-11-26 NOTE — ED Notes (Signed)
Pt returned from xray

## 2016-11-26 NOTE — Progress Notes (Signed)
ANTICOAGULATION CONSULT NOTE - Initial Consult  Pharmacy Consult for heparin dosing and monitoring  Indication: chest pain/ACS  Allergies  Allergen Reactions  . Depakote [Valproic Acid]   . Divalproex Sodium Other (See Comments)   Patient Measurements: Weight: 110 lb (49.9 kg)  Vital Signs: BP: 102/64 (03/12 1313) Pulse Rate: 86 (03/12 1313)  Labs:  Recent Labs  11/26/16 1318  HGB 7.1*  HCT 22.8*  PLT 312  CREATININE 0.66  TROPONINI 0.19*    Estimated Creatinine Clearance: 42.1 mL/min (by C-G formula based on SCr of 0.66 mg/dL).   Medical History: Past Medical History:  Diagnosis Date  . CAD (coronary artery disease)   . GERD (gastroesophageal reflux disease)   . History of hiatal hernia   . HTN (hypertension)   . Mental retardation   . Osteoarthritis   . Pneumococcus infection   . Seasonal allergies   . Seizure (HCC)   . Seizure [facility])    Assessment: 76 yo female with chest pain/ACS. Pharmacy consulted for heparin dosing and monitoring.   Goal of Therapy:  Heparin level 0.3-0.7 units/ml Monitor platelets by anticoagulation protocol: Yes   Plan:  Give 3000 units bolus x 1 Start heparin infusion at 600 units/hr Check anti-Xa level in 8 hours and daily while on heparin Continue to monitor H&H and platelets  Gardner Candle, PharmD, BCPS Clinical Pharmacist 11/26/2016 2:27 PM

## 2016-11-26 NOTE — ED Triage Notes (Signed)
Pt brought from group home today by EMS after caretakers stating pt was experiencing CP . Pt was given 3 nitros prior to EMS arrival. Pt also had an emesis after nitros. Pt had recently eaten lunch when CP occurred. Pt pointing to distal right chest as source of pain. Pt is MR . Pt in NAD at this time.

## 2016-11-26 NOTE — ED Provider Notes (Signed)
[facility] Emergency Department Provider Note  ____________________________________________   First MD Initiated Contact with Patient 11/26/16 1321     (approximate)  I have reviewed the triage vital signs and the nursing notes.   HISTORY  Chief Complaint Chest Pain  Level 5 caveat:  history/ROS limited by chronic nonverbal state   HPI Hannah Vaughan is a 76 y.o. female with a history of heart disease but who is generally well managed as an outpatient medications who presents by EMS for evaluation of chest pain.  Her caretakers at her group home report that she was complaining of chest pain and pain in the right side of her back.  This was reportedly acute in onset.  She received a total of 3 nitros by the group home staffprior to EMS arrival.  The patient also had an episode of emesis after receiving the nitroglycerin.  The chest pain reportedly started just after the patient finished eating lunch.  No additional details are available from the patient although she is alert and responsive apparently at her baseline. Former ED nurse and current SANE nurse, Darlin Drop, was in the department already and provided some additional history for me.  She knows the patient well and helps care for her at the group home.  She states that she is at her baseline other than reporting the pain and that she very rarely needs to use nitroglycerin.   Past Medical History:  Diagnosis Date  . CAD (coronary artery disease)   . GERD (gastroesophageal reflux disease)   . History of hiatal hernia   . HTN (hypertension)   . Mental retardation   . Osteoarthritis   . Pneumococcus infection   . Seasonal allergies   . Seizure (HCC)   . Seizure [facility])     Patient Active Problem List   Diagnosis Date Noted  . Acute respiratory failure with hypoxemia (HCC) 05/06/2015  . HTN (hypertension) 05/06/2015  . Seizure disorder (HCC) 05/06/2015  . CAD (coronary artery disease) 05/06/2015  .  Osteoarthritis 05/06/2015  . GERD (gastroesophageal reflux disease) 05/06/2015  . HCAP (healthcare-associated pneumonia) 05/06/2015  . Abnormal ECG 06/24/2014  . 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 Dependent edema 06/06/2014    Past Surgical History:  Procedure Laterality Date  . CHOLECYSTECTOMY      Prior to Admission medications   Medication Sig Start Date End Date Taking? Authorizing Provider  acetaminophen (TYLENOL) 325 MG tablet Take 1 tablet by mouth every 6 (six) hours.   Yes Historical Provider, MD  aspirin EC 81 MG tablet Take 1 tablet by mouth daily. 11/11/14  Yes Historical Provider, MD  calcium-vitamin D (CALCIUM 500/D) 500-200 MG-UNIT per tablet Take 1 tablet by mouth 2 (two) times daily.    Yes Historical Provider, MD  cetirizine (ZYRTEC) 10 MG tablet Take 1 tablet by mouth daily as needed. 03/25/14  Yes Historical Provider, MD  docusate sodium (STOOL SOFTENER) 100 MG capsule Take 1 capsule by mouth 2 (two) times daily.   Yes Historical Provider, MD  gabapentin (NEURONTIN) 600 MG tablet Take 1 tablet by mouth 3 (three) times daily.  06/11/14  Yes Historical Provider, MD  hydrochlorothiazide (HYDRODIURIL) 25 MG tablet Take 1 tablet by mouth daily.   Yes Historical Provider, MD  isosorbide mononitrate (IMDUR) 60 MG 24 hr tablet Take 1 tablet by mouth daily. 02/11/14  Yes Historical Provider, MD  meloxicam (MOBIC) 7.5 MG tablet Take 7.5 mg by mouth daily.   Yes Historical Provider, MD  metoprolol  succinate (TOPROL-XL) 25 MG 24 hr tablet Take 1 tablet by mouth daily.   Yes Historical Provider, MD  omeprazole (PRILOSEC) 20 MG capsule Take 1 capsule by mouth daily. 01/06/15  Yes Historical Provider, MD  phenytoin (DILANTIN) 100 MG ER capsule Take 300 mg by mouth at bedtime.    Yes Historical Provider, MD  traMADol (ULTRAM) 50 MG tablet Take 1 tablet (50 mg total) by mouth every 6 (six) hours as needed. 05/09/15  Yes Enid Baasadhika Kalisetti, MD  cyanocobalamin (,VITAMIN B-12,) 1000 MCG/ML injection  Inject 1 mL into the muscle every 30 (thirty) days.    Historical Provider, MD  hydrocortisone (ANUSOL-HC) 25 MG suppository Place 25 mg rectally 2 (two) times daily as needed for hemorrhoids or itching.    Historical Provider, MD  hydrocortisone-pramoxine [facility](PROCTOFOAM-HC) rectal foam Place 1 applicator rectally 2 (two) times daily.    Historical Provider, MD  levofloxacin (LEVAQUIN) 500 MG tablet Take 1 tablet (500 mg total) by mouth daily. Patient not taking: Reported on 11/26/2016 11/07/16   Prentice DockerMartin A Defrancesco, MD  LORazepam (ATIVAN) 0.5 MG tablet Take 0.5 mg by mouth every 8 (eight) hours.    Historical Provider, MD  magnesium hydroxide (MILK OF MAGNESIA) 400 MG/5ML suspension Take 5 mLs by mouth as needed.    Historical Provider, MD  nitroGLYCERIN (NITROSTAT) 0.4 MG SL tablet Place 1 tablet under the tongue as needed. 04/06/15   Historical Provider, MD  PHENObarbital 20 MG/5ML elixir Take 15 mLs by mouth at bedtime.    Historical Provider, MD  phenytoin (DILANTIN) 100 MG ER capsule Take 260 mg by mouth daily.    Historical Provider, MD  tolnaftate (TINACTIN) 1 % spray Apply 1 application topically 2 (two) times daily.    Historical Provider, MD  triamcinolone cream (KENALOG) 0.1 % Apply 1 application topically.    Historical Provider, MD    Allergies Depakote [valproic acid] and Divalproex sodium  Family History  Problem Relation Age of Onset  . CAD Mother   . Congestive Heart Failure Mother   . CVA Father   . Heart attack Brother   . Hypertension Sister   . Diabetes Sister     Social History Social History  Substance Use Topics  . Smoking status: Never Smoker  . Smokeless tobacco: Never Used  . Alcohol use No    Review of Systems Level 5 caveat:  history/ROS limited by chronic nonverbal state  ____________________________________________   PHYSICAL EXAM:  VITAL SIGNS: ED Triage Vitals [11/26/16 1313]  Enc Vitals Group     BP 102/64     Pulse Rate 86     Resp 18      Temp      Temp src      SpO2 94 %     Weight      Height      Head Circumference      Peak Flow      Pain Score      Pain Loc      Pain Edu?      Excl. in GC?     Constitutional: Alert and responsive to voice and stimuli.  Reportedly at her baseline according to people know her Eyes: Conjunctivae are normal. PERRL. EOMI. Head: Atraumatic. Nose: No congestion/rhinnorhea. Mouth/Throat: Mucous membranes are moist.  Tongue protrudes from mouth at baseline. Neck: No stridor.  No meningeal signs.   Cardiovascular: Normal rate, regular rhythm. Good peripheral circulation. Grossly normal heart sounds. Respiratory: Normal respiratory effort.  No retractions. Lungs  CTAB. Gastrointestinal: Soft and nontender. No distention.  Musculoskeletal: No lower extremity tenderness nor edema. No gross deformities of extremities. Neurologic:  Normal speech and language. No gross focal neurologic deficits are appreciated.  Skin:  Skin is warm, dry and intact. No rash noted.  ____________________________________________   LABS (all labs ordered are listed, but only abnormal results are displayed)  Labs Reviewed  COMPREHENSIVE METABOLIC PANEL - Abnormal; Notable for the following:       Result Value   Potassium 2.9 (*)    Glucose, Bld 100 (*)    BUN 25 (*)    Calcium 8.3 (*)    Total Protein 5.9 (*)    Albumin 2.3 (*)    ALT 7 (*)    All other components within normal limits  TROPONIN I - Abnormal; Notable for the following:    Troponin I 0.19 (*)    All other components within normal limits  CBC - Abnormal; Notable for the following:    RBC 3.50 (*)    Hemoglobin 7.1 (*)    HCT 22.8 (*)    MCV 65.3 (*)    MCH 20.2 (*)    MCHC 30.9 (*)    RDW 20.2 (*)    All other components within normal limits  URINALYSIS, ROUTINE W REFLEX MICROSCOPIC  PROTIME-INR  APTT   ____________________________________________  EKG  ED ECG REPORT I, Tanish Prien, the attending physician, personally  viewed and interpreted this ECG.  Date: 11/26/2016 EKG Time: 13:36 Rate: 73 Rhythm: normal sinus rhythm with frequent PVCs and nonspecific intraventricular conduction delay QRS Axis: normal Intervals: normal ST/T Wave abnormalities: T-wave inversion in leads 3 and aVF and V3 Conduction Disturbances: none Narrative Interpretation: unremarkable  ____________________________________________  RADIOLOGY   No results found.  ____________________________________________   PROCEDURES  Procedure(s) performed:   .Critical Care Performed by: Loleta Rose Authorized by: Loleta Rose   Critical care provider statement:    Critical care time (minutes):  30   Critical care time was exclusive of:  Separately billable procedures and treating other patients   Critical care was necessary to treat or prevent imminent or life-threatening deterioration of the following conditions:  Cardiac failure and circulatory failure   Critical care was time spent personally by me on the following activities:  Development of treatment plan with patient or surrogate, discussions with consultants, evaluation of patient's response to treatment, examination of patient, obtaining history from patient or surrogate, ordering and performing treatments and interventions, ordering and review of laboratory studies, ordering and review of radiographic studies, pulse oximetry, re-evaluation of patient's condition and review of old charts     Critical Care performed: Yes, see critical care procedure note(s) ____________________________________________   INITIAL IMPRESSION / ASSESSMENT AND PLAN / ED COURSE  Pertinent labs & imaging results that were available during my care of the patient were reviewed by me and considered in my medical decision making (see chart for details).  The patient is not in distress at this point but does gesture towards the right side of her chest and back as causing her some discomfort.   I am awaiting EKG and standard lab work.  We will also obtain chest x-ray.  Vital signs are reassuring.   Clinical Course as of Nov 27 1427  Mon Nov 26, 2016  1402 NSTEMI 0.19, will give ASA and heparin  [CF]  1423 Hemoccult was negative.  We will continue with the plan for aspirin and heparin.  We will also replete her potassium which  is 2.9 today.  [CF]  1424 She is still indicating that she is hurting.  I will give her a small dose of morphine.  I have consult with the hospitalist and am waiting to hear back for admission.  [CF]    Clinical Course User Index [CF] Loleta Rose, MD    ____________________________________________  FINAL CLINICAL IMPRESSION(S) / ED DIAGNOSES  Final diagnoses:  NSTEMI (non-ST elevated myocardial infarction) (HCC)  Anemia, unspecified type  Hypokalemia     MEDICATIONS GIVEN DURING THIS VISIT:  Medications  aspirin chewable tablet 324 mg (not administered)  ondansetron (ZOFRAN) injection 4 mg (not administered)  heparin bolus via infusion 3,000 Units (not administered)    Followed by  heparin ADULT infusion 100 units/mL (25000 units/237mL sodium chloride 0.45%) (not administered)  morphine 2 MG/ML injection 2 mg (not administered)     NEW OUTPATIENT MEDICATIONS STARTED DURING THIS VISIT:  New Prescriptions   No medications on file    Modified Medications   No medications on file    Discontinued Medications   No medications on file     Note:  This document was prepared using Dragon voice recognition software and may include unintentional dictation errors.    Loleta Rose, MD 11/26/16 [phone]

## 2016-11-26 NOTE — Progress Notes (Signed)
Patient has orders for 1 unit of RBC, however we have not been able to obtain consent from next of kin. MD Anne HahnWillis notified. Per MD, this is an medically necessary procedure because patient is having "cardiac process" therefore we can forgo a consent at this time. This RN will administer 1 unit of RBC. Will continue to monitor.

## 2016-11-26 NOTE — ED Notes (Signed)
Patient transported to X-ray 

## 2016-11-27 LAB — CBC
HEMATOCRIT: 27.9 % — AB (ref 35.0–47.0)
Hemoglobin: 8.9 g/dL — ABNORMAL LOW (ref 12.0–16.0)
MCH: 22 pg — ABNORMAL LOW (ref 26.0–34.0)
MCHC: 31.7 g/dL — ABNORMAL LOW (ref 32.0–36.0)
MCV: 69.5 fL — AB (ref 80.0–100.0)
Platelets: 277 10*3/uL (ref 150–440)
RBC: 4.02 MIL/uL (ref 3.80–5.20)
RDW: 24.3 % — ABNORMAL HIGH (ref 11.5–14.5)
WBC: 7.6 10*3/uL (ref 3.6–11.0)

## 2016-11-27 LAB — TYPE AND SCREEN
ABO/RH(D): O POS
Antibody Screen: NEGATIVE
UNIT DIVISION: 0

## 2016-11-27 LAB — BASIC METABOLIC PANEL
ANION GAP: 4 — AB (ref 5–15)
BUN: 22 mg/dL — ABNORMAL HIGH (ref 6–20)
CHLORIDE: 106 mmol/L (ref 101–111)
CO2: 27 mmol/L (ref 22–32)
Calcium: 8.4 mg/dL — ABNORMAL LOW (ref 8.9–10.3)
Creatinine, Ser: 0.52 mg/dL (ref 0.44–1.00)
GFR calc non Af Amer: >60 mL/min (ref >60)
Glucose, Bld: 92 mg/dL (ref 65–99)
POTASSIUM: 4.3 mmol/L (ref 3.5–5.1)
SODIUM: 137 mmol/L (ref 135–145)

## 2016-11-27 LAB — TROPONIN I: Troponin I: 0.15 ng/mL (ref <0.03)

## 2016-11-27 LAB — HEPARIN LEVEL (UNFRACTIONATED)
Heparin Unfractionated: 0.19 IU/mL — ABNORMAL LOW (ref 0.30–0.70)
Heparin Unfractionated: 0.55 IU/mL (ref 0.30–0.70)

## 2016-11-27 LAB — BPAM RBC
Blood Product Expiration Date: 201803302359
ISSUE DATE / TIME: 201803122311
UNIT TYPE AND RH: 5100

## 2016-11-27 MED ORDER — HEPARIN BOLUS VIA INFUSION
1550.0000 [IU] | Freq: Once | INTRAVENOUS | Status: AC
  Administered 2016-11-27: 1550 [IU] via INTRAVENOUS
  Filled 2016-11-27: qty 1550

## 2016-11-27 MED ORDER — LACTULOSE 10 GM/15ML PO SOLN: 20.0000 g | Freq: Every day | ORAL | Status: DC | PRN

## 2016-11-27 MED ORDER — HEPARIN (PORCINE) IN NACL 100-0.45 UNIT/ML-% IJ SOLN
800.0000 [IU]/h | INTRAMUSCULAR | Status: DC
  Administered 2016-11-27: 800 [IU]/h via INTRAVENOUS

## 2016-11-27 NOTE — Consult Note (Signed)
[facility]  CARDIOLOGY CONSULT NOTE  Patient ID: Hannah GroutDoris E Vaughan MRN: [id number] DOB/AGE: 1940-10-27 76 y.o.  Admit date: 11/26/2016 Referring Physician Imogene Burnhen Primary Physician Hannah Vaughan Primary Cardiologist Fath Reason for Consultation elevated troponin  HPI: 76 year old female referred for evaluation of elevated troponin. The patient has known history of severe cognitive impairment. She currently resides in a group home. [facility] apparently complaining of chest pain. Admission labs were notable for troponin of 0.19, as well as, marked anemia with hemoglobin and hematocrit of 7.1 and 22.8,respectively. Admission ECG revealed sinus rhythm with frequent PVCs, and old anteroseptal MI. Patient unable to apropriately converse but apparently is complaining of right sided rib pain.  Review of systems complete and found to be negative unless listed above     Past Medical History:  Diagnosis Date  . CAD (coronary artery disease)   . GERD (gastroesophageal reflux disease)   . History of hiatal hernia   . HTN (hypertension)   . Mental retardation   . Osteoarthritis   . Pneumococcus infection   . Seasonal allergies   . Seizure (HCC)   . Seizure [facility](HCC)     Past Surgical History:  Procedure Laterality Date  . CHOLECYSTECTOMY      Prescriptions Prior to Admission  Medication Sig Dispense Refill Last Dose  . acetaminophen (TYLENOL) 325 MG tablet Take 1 tablet by mouth every 6 (six) hours.   prn at prn  . Aluminum & Magnesium Hydroxide (MALDROXAL ANTACID PO) Take by mouth as needed.   prn at prn  . aspirin EC 81 MG tablet Take 1 tablet by mouth daily.   11/26/2016 at 0700  . calcium-vitamin D (CALCIUM 500/D) 500-200 MG-UNIT per tablet Take 1 tablet by mouth 2 (two) times daily.    11/26/2016 at 0700  . carbamide peroxide (DEBROX) 6.5 % otic solution Place 5 drops into both ears every 30 (thirty) days.     . cetirizine (ZYRTEC) 10 MG tablet Take 1 tablet by mouth  daily as needed.   11/26/2016 at 0700  . cyanocobalamin (,VITAMIN B-12,) 1000 MCG/ML injection Inject 1 mL into the muscle every 30 (thirty) days.   Taking  . docusate sodium (STOOL SOFTENER) 100 MG capsule Take 1 capsule by mouth 2 (two) times daily.   11/26/2016 at 0700  . gabapentin (NEURONTIN) 600 MG tablet Take 1 tablet by mouth 4 (four) times daily.    11/26/2016 at 0700  . guaiFENesin (ROBITUSSIN) 100 MG/5ML SOLN Take 5 mLs by mouth every 4 (four) hours as needed for cough or to loosen phlegm.   prn at prn  . hydrochlorothiazide (HYDRODIURIL) 25 MG tablet Take 1 tablet by mouth daily.   11/26/2016 at 0700  . hydrocortisone (ANUSOL-HC) 25 MG suppository Place 25 mg rectally 2 (two) times daily as needed for hemorrhoids or itching.   prn at prn  . hydrocortisone-pramoxine (PROCTOFOAM-HC) rectal foam Place 1 applicator rectally 2 (two) times daily.   prn at prn  . isosorbide mononitrate (IMDUR) 60 MG 24 hr tablet Take 1 tablet by mouth daily.   11/26/2016 at 0700  . loperamide (IMODIUM) 2 MG capsule Take 2 mg by mouth as needed for diarrhea or loose stools.   prn at prn  . magnesium hydroxide (MILK OF MAGNESIA) 400 MG/5ML suspension Take 5 mLs by mouth as needed.   prn at prn  . meloxicam (MOBIC) 7.5 MG tablet Take 7.5 mg by mouth daily.   11/26/2016 at 0700  . metoprolol  succinate (TOPROL-XL) 25 MG 24 hr tablet Take 1 tablet by mouth daily.   11/26/2016 at 0700  . miconazole (MICOTIN) 2 % powder Apply 1 application topically 2 (two) times daily.   11/26/2016 at 0800  . mupirocin ointment (BACTROBAN) 2 % Place 1 application into the nose 3 (three) times daily.   11/26/2016 at 0700  . nitroGLYCERIN (NITROSTAT) 0.4 MG SL tablet Place 1 tablet under the tongue as needed.   prn at prn  . nystatin (NYSTATIN) powder Apply 1 g topically as needed.   prn at prn  . omeprazole (PRILOSEC) 20 MG capsule Take 1 capsule by mouth daily.   11/26/2016 at 0700  . PHENObarbital 20 MG/5ML elixir Take 15 mLs by mouth at  bedtime.   11/25/2016 at 2100  . phenytoin (DILANTIN) 100 MG ER capsule Take 300 mg by mouth at bedtime.    11/25/2016 at 2000  . Skin Protectants, Misc. (EUCERIN) cream Apply 1 application topically daily.   11/25/2016 at 2000  . terbinafine (LAMISIL) 1 % cream Apply 1 application topically 2 (two) times daily.   prn at prn  . tolnaftate (TINACTIN) 1 % spray Apply 1 application topically 2 (two) times daily.   prn at prn  . traMADol (ULTRAM) 50 MG tablet Take 1 tablet (50 mg total) by mouth every 6 (six) hours as needed. 30 tablet 0 prn at prn  . triamcinolone cream (KENALOG) 0.1 % Apply 1 application topically 2 (two) times daily.    11/26/2016 at 0800  . levofloxacin (LEVAQUIN) 500 MG tablet Take 1 tablet (500 mg total) by mouth daily. (Patient not taking: Reported on 11/26/2016) 7 tablet 0 Completed Course at Unknown time  . LORazepam (ATIVAN) 0.5 MG tablet Take 0.5 mg by mouth every 8 (eight) hours.   Not Taking at Unknown time  . phenytoin (DILANTIN) 100 MG ER capsule Take 260 mg by mouth daily.   Not Taking at Unknown time   Social History   Social History  . Marital status: Single    Spouse name: N/A  . Number of children: N/A  . Years of education: N/A   Occupational History  . Not on file.   Social History Main Topics  . Smoking status: Never Smoker  . Smokeless tobacco: Never Used  . Alcohol use No  . Drug use: No  . Sexual activity: No   Other Topics Concern  . Not on file   Social History Narrative  . No narrative on file    Family History  Problem Relation Age of Onset  . CAD Mother   . Congestive Heart Failure Mother   . CVA Father   . Heart attack Brother   . Hypertension Sister   . Diabetes Sister       Review of systems complete and found to be negative unless listed above      PHYSICAL EXAM  General: Well developed, well nourished, in no acute distress HEENT:  Normocephalic and atramatic Neck:  No JVD.  Lungs: Clear bilaterally to auscultation and  percussion. Heart: HRRR . Normal S1 and S2 without gallops or murmurs.  Abdomen: Bowel sounds are positive, abdomen soft and non-tender  Msk:  Back normal, normal gait. Normal strength and tone for age. Extremities: No clubbing, cyanosis or edema.   Neuro: Alert and oriented X 3. Psych:  Good affect, responds appropriately  Labs:   Lab Results  Component Value Date   WBC 7.6 11/27/2016   HGB 8.9 (L) 11/27/2016  HCT 27.9 (L) 11/27/2016   MCV 69.5 (L) 11/27/2016   PLT 277 11/27/2016    Recent Labs Lab 11/26/16 1318 11/27/16 0710  NA 141 137  K 2.9* 4.3  CL 106 106  CO2 28 27  BUN 25* 22*  CREATININE 0.66 0.52  CALCIUM 8.3* 8.4*  PROT 5.9*  --   BILITOT 0.4  --   ALKPHOS 121  --   ALT 7*  --   AST 15  --   GLUCOSE 100* 92   Lab Results  Component Value Date   TROPONINI 0.15 (HH) 11/27/2016   No results found for: CHOL No results found for: HDL No results found for: LDLCALC No results found for: TRIG No results found for: CHOLHDL No results found for: LDLDIRECT    Radiology: Dg Chest 2 View  Result Date: 11/26/2016 CLINICAL DATA:  Chest pain EXAM: CHEST  2 VIEW COMPARISON:  11/06/2016 chest radiograph. FINDINGS: Stable cardiomediastinal silhouette with mild cardiomegaly. No pneumothorax. Small left pleural effusion. No right pleural effusion . Mild pulmonary edema. IMPRESSION: 1. Mild congestive heart failure. 2. Small left pleural effusion. Electronically Signed   By: Delbert Phenix M.D.   On: 11/26/2016 16:07   Dg Chest 2 View  Result Date: 11/06/2016 CLINICAL DATA:  76 year old female with a history of cough and congestion for 4 days EXAM: CHEST  2 VIEW COMPARISON:  07/12/2016 FINDINGS: Cardiomediastinal silhouette unchanged. Mural calcifications of the left ventricular apex, better characterized on prior CT. Calcifications of the aortic arch. Airspace opacity at the medial right base which was not present on the comparison chest x-ray. No pneumothorax. No pleural  effusion. Stigmata of emphysema, with increased retrosternal airspace, flattened hemidiaphragms, increased AP diameter, and hyperinflation on the AP view. Fracture of the posterior left seventh rib is unchanged. Multilevel degenerative changes of the thoracic spine. IMPRESSION: Ill-defined airspace opacity at the medial right base extending from the hilum, potentially representing developing infection given the history. Unchanged cardiomegaly, mural calcifications of left ventricular apex, and aortic atherosclerosis. Right posterior rib fracture of seventh rib, unchanged. Signed, Yvone Neu. Loreta Ave, DO [facility] Electronically Signed   By: Gilmer Mor D.O.   On: 11/06/2016 17:17    EKG: sinus rhythm with evidence of old anteroseptal MI  ASSESSMENT AND PLAN:   1. Atypical chest pain in patient with severe cognitive impairment, borderline elevated troponin, in the setting of marked anemia. The patient is a good candidate for cardiac catheterization, percutaneous coronary intervention in the setting of marked anemia, or surgical revascularization. 2. Acute on chronic anemia  Recommendations  1. Conservative management, defer cardiac catheterization 2. DC heparin in light of anemia 3. Maintain hemoglobin above 9 4. Continue low-dose aspirin 5. No further cardiac diagnostics at this time 6. Follow-up with Dr Lady Gary as out patient  Signed: Marcina Millard MD,PhD, [facility] 11/27/2016, 8:24 AM

## 2016-11-27 NOTE — Care Management Important Message (Signed)
Important Message  Patient Details  Name: Oneal GroutDoris E Muegge MRN: [id number] Date of Birth: [birth year]   Medicare Important Message Given:  Yes  Caregiver from Anselm Pancoastalph Scott signed the IM.  Patient with chronically impaired mental capacity to understand or sign    Eber HongGreene, Indigo Chaddock R, RN 11/27/2016, 11:10 AM

## 2016-11-27 NOTE — Progress Notes (Signed)
[employer] - [place] at [facility]   PATIENT NAME: Hannah Vaughan    MR#:  [id number]  DATE OF BIRTH:  [birth year]  SUBJECTIVE:  CHIEF COMPLAINT:   Chief Complaint  Patient presents with  . Chest Pain   The patient is a demented. In no acute distress. REVIEW OF SYSTEMS:  Review of Systems  Unable to perform ROS: Patient nonverbal    DRUG ALLERGIES:   Allergies  Allergen Reactions  . Depakote [Valproic Acid]   . Divalproex Sodium Other (See Comments)   VITALS:  Blood pressure 119/64, pulse 91, temperature 97.8 F (36.6 C), temperature source Oral, resp. rate 19, height 5\' 2"  (1.575 m), weight 116 lb 1.6 oz (52.7 kg), SpO2 90 %. PHYSICAL EXAMINATION:  Physical Exam  HENT:  Head: Normocephalic.  Eyes: Conjunctivae and EOM are normal.  Neck: Normal range of motion. Neck supple. No JVD present. No tracheal deviation present.  Cardiovascular: Normal rate and regular rhythm.  Exam reveals no gallop.   No murmur heard. Pulmonary/Chest: Effort normal and breath sounds normal. No respiratory distress. She has no wheezes.  Abdominal: Bowel sounds are normal. She exhibits no distension. There is no tenderness.  Musculoskeletal: She exhibits no edema or tenderness.  Neurological:  Cognitive impairment and nonverbal. Unable to exam  Skin: No rash noted. No erythema.   LABORATORY PANEL:  Female CBC  Recent Labs Lab 11/27/16 0710  WBC 7.6  HGB 8.9*  HCT 27.9*  PLT 277   ------------------------------------------------------------------------------------------------------------------ Chemistries   Recent Labs Lab 11/26/16 1318 11/26/16 1436 11/27/16 0710  NA 141  --  137  K 2.9*  --  4.3  CL 106  --  106  CO2 28  --  27  GLUCOSE 100*  --  92  BUN 25*  --  22*  CREATININE 0.66  --  0.52  CALCIUM 8.3*  --  8.4*  MG  --  1.7  --   AST 15  --   --   ALT 7*  --   --   ALKPHOS 121  --   --   BILITOT 0.4  --   --    RADIOLOGY:  Dg Chest 2  View  Result Date: 11/26/2016 CLINICAL DATA:  Chest pain EXAM: CHEST  2 VIEW COMPARISON:  11/06/2016 chest radiograph. FINDINGS: Stable cardiomediastinal silhouette with mild cardiomegaly. No pneumothorax. Small left pleural effusion. No right pleural effusion . Mild pulmonary edema. IMPRESSION: 1. Mild congestive heart failure. 2. Small left pleural effusion. Electronically Signed   By: Delbert Phenix M.D.   On: 11/26/2016 16:07   ASSESSMENT AND PLAN:   * Atypical chest pain with borderline elevated troponin, in the setting of marked anemia. Per Dr. Darrold Junker, the patient is not  a good candidate for cardiac catheterization, percutaneous coronary intervention in the setting of marked anemia, or surgical revascularization. Conservative management, defer cardiac catheterization. DC heparin in light of anemia. Continue aspirin, statin, beta blocker.  * Acute on chronic anemia Patient has not had any acute blood loss as per history. Check stool for Hemoccult. S/P 1 unit packed RBC due to her acute MI. Hb is 8.9 after blood transfusion. Follow-up hemoglobin in the am.  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 Stable. No signs of fluid overload.  * Cognitive impairment  All the records are reviewed and case discussed with Care Management/Social Worker. Management plans discussed with the patient, sisters and they are in agreement.  CODE STATUS: Full Code  TOTAL TIME TAKING CARE OF THIS PATIENT: 36 minutes.   More than 50% of the time was spent in counseling/coordination of care: YES  POSSIBLE D/C IN 1-2 DAYS, DEPENDING ON CLINICAL CONDITION.   Shaune Pollackhen, Wilho Sharpley M.D on 11/27/2016 at 2:55 PM  Between 7am to 6pm - Pager - [phone]  After 6pm go to [email or web address] - Social research officer, governmentpassword [email or web address]  [employer] [facility]  [facility]  Office  [phone]  CC: Primary care physician; Lauro RegulusANDERSON,MARSHALL W., MD  Note: This dictation was  prepared with Dragon dictation along with smaller phrase technology. Any transcriptional errors that result from this process are unintentional.

## 2016-11-27 NOTE — Progress Notes (Addendum)
Caregiver at bedside.  Patient resting comfortably.  NSR on monitor.   Still need occult stool sample.  No active signs of bleeding on this shift.

## 2016-11-27 NOTE — Progress Notes (Signed)
D/c heparin and NPO order per cardiology.

## 2016-11-27 NOTE — Plan of Care (Signed)
Problem: Education: Goal: Understanding of cardiac disease, CV risk reduction, and recovery process will improve Outcome: Not Progressing Patient is nonverbal and does not follow commands. Unable to assess if patient is accurately understanding the education material that is being presented to her.

## 2016-11-27 NOTE — Progress Notes (Signed)
ANTICOAGULATION CONSULT NOTE - Initial Consult  Pharmacy Consult for heparin dosing and monitoring  Indication: chest pain/ACS      Allergies  Allergen Reactions  . Depakote [Valproic Acid]   . Divalproex Sodium Other (See Comments)   Patient Measurements: Weight: 110 lb (49.9 kg)  Vital Signs: BP: 102/64 (03/12 1313) Pulse Rate: 86 (03/12 1313)  Labs:  Recent Labs (last 2 labs)    Recent Labs  11/26/16 1318  HGB 7.1*  HCT 22.8*  PLT 312  CREATININE 0.66  TROPONINI 0.19*      Estimated Creatinine Clearance: 42.1 mL/min (by C-G formula based on SCr of 0.66 mg/dL).   Medical History:     Past Medical History:  Diagnosis Date  . CAD (coronary artery disease)   . GERD (gastroesophageal reflux disease)   . History of hiatal hernia   . HTN (hypertension)   . Mental retardation   . Osteoarthritis   . Pneumococcus infection   . Seasonal allergies   . Seizure (HCC)   . Seizure [facility](HCC)    Assessment: 76 yo female with chest pain/ACS. Pharmacy consulted for heparin dosing and monitoring.   Goal of Therapy:  Heparin level 0.3-0.7 units/ml Monitor platelets by anticoagulation protocol: Yes   Plan:  Give 3000 units bolus x 1 Start heparin infusion at 600 units/hr Check anti-Xa level in 8 hours and daily while on heparin Continue to monitor H&H and platelets  3/13 2300 HL: 0.19 -- level subtherapeutic will bolus w/ heparin 1550 units x 1 (30 units/kg) and increase rate to 800 units/hour (15 units/kg/hour) and will recheck heparin level in 8 hours on 3/13 @ 0700.  Thank you for this consult.  Thomasene Rippleavid Keyleigh Manninen, PharmD, BCPS Clinical Pharmacist 11/27/2016

## 2016-11-27 NOTE — Progress Notes (Signed)
ANTICOAGULATION CONSULT NOTE - Initial Consult  Pharmacy Consult for heparin dosing and monitoring  Indication: chest pain/ACS  Allergies  Allergen Reactions  . Depakote [Valproic Acid]   . Divalproex Sodium Other (See Comments)   Patient Measurements: Height: 5\' 2"  (157.5 cm) Weight: 116 lb 1.6 oz (52.7 kg) IBW/kg (Calculated) : 50.1  Vital Signs: Temp: 97.9 F (36.6 C) (03/13 0436) Temp Source: Oral (03/13 0436) BP: 106/56 (03/13 0436) Pulse Rate: 78 (03/13 0436)  Labs:  Recent Labs  11/26/16 1318 11/26/16 1436 11/26/16 1629 11/26/16 1952 11/26/16 2259 11/27/16 0000 11/27/16 0710  HGB 7.1*  --   --  7.3*  --   --  8.9*  HCT 22.8*  --   --   --   --   --  27.9*  PLT 312  --   --   --   --   --  277  APTT  --  29  --   --   --   --   --   LABPROT  --  13.6  --   --   --   --   --   INR  --  1.04  --   --   --   --   --   HEPARINUNFRC  --   --   --   --  0.19*  --  0.55  CREATININE 0.66  --   --   --   --   --  0.52  TROPONINI 0.19*  --  0.19*  --   --  0.15*  --     Estimated Creatinine Clearance: 48.8 mL/min (by C-G formula based on SCr of 0.52 mg/dL).   Medical History: Past Medical History:  Diagnosis Date  . CAD (coronary artery disease)   . GERD (gastroesophageal reflux disease)   . History of hiatal hernia   . HTN (hypertension)   . Mental retardation   . Osteoarthritis   . Pneumococcus infection   . Seasonal allergies   . Seizure (HCC)   . Seizure [facility](HCC)    Assessment: 76 yo female with chest pain/ACS diagnosed with NSTEMI. Pharmacy consulted for heparin dosing and monitoring. Of note, patient has a history of chronic anemia.  Goal of Therapy:  Heparin level 0.3-0.7 units/ml Monitor platelets by anticoagulation protocol: Yes   Plan:  3/13 0710 HL = 0.55 (therapeutic). Will continue heparin drip at current rate of 800 units/hr and order confirmatory HL in 8 hours per protocol.  CBC ordered daily with AM labs.  Cindi CarbonMary M Kanai Hilger, PharmD,  BCPS Clinical Pharmacist 11/27/2016 7:57 AM

## 2016-11-28 LAB — HEMOGLOBIN: HEMOGLOBIN: 8.8 g/dL — AB (ref 12.0–16.0)

## 2016-11-28 MED ORDER — DEXTROSE 5 % IV SOLN
1.0000 g | INTRAVENOUS | Status: DC
  Administered 2016-11-28: 1 g via INTRAVENOUS
  Filled 2016-11-28: qty 10

## 2016-11-28 MED ORDER — CEPHALEXIN 500 MG PO CAPS: 500.0000 mg | ORAL_CAPSULE | Freq: Four times a day (QID) | ORAL | 0 refills | Status: DC

## 2016-11-28 MED ORDER — CEFTRIAXONE SODIUM 1 G IJ SOLR
1.0000 g | INTRAMUSCULAR | Status: DC
  Filled 2016-11-28: qty 10

## 2016-11-28 NOTE — Progress Notes (Signed)
Patient being discharged.  D/c tele and PIV.  Given discharge summary and paperwork to Rosana BergerFaye Walker who is escorting her back to group home.

## 2016-11-28 NOTE — Progress Notes (Signed)
[facility]  SUBJECTIVE: Patient unable to converse due to underlying dementia. Resting comfortably in bed. Does not appear to be in pain or distress.   Vitals:   11/27/16 1115 11/27/16 1949 11/28/16 0324 11/28/16 0500  BP: 119/64 (!) 106/53 105/60   Pulse: 91 89 80   Resp: 19 18 16    Temp: 97.8 F (36.6 C) 99.7 F (37.6 C) 98.3 F (36.8 C)   TempSrc: Oral Oral Oral   SpO2: 90% 90% 92%   Weight:    52.9 kg (116 lb 9.6 oz)  Height:         Intake/Output Summary (Last 24 hours) at 11/28/16 1012 Last data filed at 11/27/16 1906  Gross per 24 hour  Intake              360 ml  Output                0 ml  Net              360 ml      PHYSICAL EXAM  General: Well developed, well nourished, in no acute distress HEENT:  Normocephalic and atramatic Neck:  No JVD.  Lungs: Clear bilaterally to auscultation Heart: HRRR . Normal S1 and S2 without gallops or murmurs.  Abdomen: Bowel sounds are positive, abdomen soft and non-tender  Msk: Patient lying in bed Extremities: No clubbing, cyanosis or edema.   Neuro: Arousable, does not converse Psych: cognitive impairment   LABS: Basic Metabolic Panel:  Recent Labs  16/10/96 1318 11/26/16 1436 11/27/16 0710  NA 141  --  137  K 2.9*  --  4.3  CL 106  --  106  CO2 28  --  27  GLUCOSE 100*  --  92  BUN 25*  --  22*  CREATININE 0.66  --  0.52  CALCIUM 8.3*  --  8.4*  MG  --  1.7  --    Liver Function Tests:  Recent Labs  11/26/16 1318  AST 15  ALT 7*  ALKPHOS 121  BILITOT 0.4  PROT 5.9*  ALBUMIN 2.3*   No results for input(s): LIPASE, AMYLASE in the last 72 hours. CBC:  Recent Labs  11/26/16 1318  11/27/16 0710 11/28/16 0313  WBC 5.4  --  7.6  --   HGB 7.1*  < > 8.9* 8.8*  HCT 22.8*  --  27.9*  --   MCV 65.3*  --  69.5*  --   PLT 312  --  277  --   < > = values in this interval not displayed. Cardiac Enzymes:  Recent Labs  11/26/16 1318 11/26/16 1629 11/27/16 0000  TROPONINI 0.19* 0.19* 0.15*    BNP: Invalid input(s): POCBNP D-Dimer: No results for input(s): DDIMER in the last 72 hours. Hemoglobin A1C: No results for input(s): HGBA1C in the last 72 hours. Fasting Lipid Panel: No results for input(s): CHOL, HDL, LDLCALC, TRIG, CHOLHDL, LDLDIRECT in the last 72 hours. Thyroid Function Tests: No results for input(s): TSH, T4TOTAL, T3FREE, THYROIDAB in the last 72 hours.  Invalid input(s): FREET3 Anemia Panel:  Recent Labs  11/26/16 1629  FERRITIN 7*  TIBC 434  IRON 13*    Dg Chest 2 View  Result Date: 11/26/2016 CLINICAL DATA:  Chest pain EXAM: CHEST  2 VIEW COMPARISON:  11/06/2016 chest radiograph. FINDINGS: Stable cardiomediastinal silhouette with mild cardiomegaly. No pneumothorax. Small left pleural effusion. No right pleural effusion . Mild pulmonary edema. IMPRESSION: 1. Mild congestive heart failure.  2. Small left pleural effusion. Electronically Signed   By: Delbert PhenixJason A Poff M.D.   On: 11/26/2016 16:07       TELEMETRY: NSR, 87 bpm  ASSESSMENT AND PLAN:  Active Problems:   NSTEMI (non-ST elevated myocardial infarction) (HCC)    1. Atypical chest pain in patient with severe cognitive impairment, borderline elevated troponin, in the setting of marked anemia, not a good candidate for cardiac cath. Patient not complaining of chest pain and does not appear to be in distress.    Recommendations: 1. Conservative management, defer cardiac catheterization 2. Continue low dose aspirin 3. No further diagnostics at this time. 4. Follow-up as outpatient with Dr. Micheline RoughFath  Baeleigh Devincent, PA-C 11/28/2016 10:12 AM

## 2016-11-28 NOTE — Discharge Summary (Signed)
[employer] - [place] at [facility]   PATIENT NAME: Hannah Vaughan    MR#:  [id number]  DATE OF BIRTH:  02/08/1941  DATE OF ADMISSION:  11/26/2016   ADMITTING PHYSICIAN: Milagros Loll, MD  DATE OF DISCHARGE:  11/28/2016  PRIMARY CARE PHYSICIAN: Lauro Regulus., MD   ADMISSION DIAGNOSIS:  Hypokalemia [E87.6] NSTEMI (non-ST elevated myocardial infarction) (HCC) [I21.4] Anemia, unspecified type [D64.9] DISCHARGE DIAGNOSIS:  Active Problems:   NSTEMI (non-ST elevated myocardial infarction) (HCC)  SECONDARY DIAGNOSIS:   Past Medical History:  Diagnosis Date  . CAD (coronary artery disease)   . GERD (gastroesophageal reflux disease)   . History of hiatal hernia   . HTN (hypertension)   . Mental retardation   . Osteoarthritis   . Pneumococcus infection   . Seasonal allergies   . Seizure (HCC)   . Seizure [facility])    HOSPITAL COURSE:  * Atypical chest pain with borderline elevated troponin, in the setting of marked anemia. Per Dr. Darrold Junker, the patient is not  a good candidate for cardiac catheterization, percutaneous coronary intervention in the setting of marked anemia, or surgical revascularization. Conservative management, defer cardiac catheterization. DCed heparin in light of anemia. Continue aspirin, statin, beta blocker.  * Acute on chronic anemia Patient has not had any acute blood loss as per history. Check stool for Hemoccult. S/P 1 unit packed RBC due to her acute MI. Hb is 8.9 after blood transfusion. Follow-up hemoglobin is 8,.8.  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 Stable. No signs of fluid overload.  * Cognitive impairment  8 UTI,  Given rocephin, change to keflex. (last U/C; E Coli sensitive to rocephin) DISCHARGE CONDITIONS:  Stable, discharge to group home today. CONSULTS OBTAINED:  Treatment Team:  Marcina Millard, MD DRUG ALLERGIES:   Allergies  Allergen  Reactions  . Depakote [Valproic Acid]   . Divalproex Sodium Other (See Comments)   DISCHARGE MEDICATIONS:   Allergies as of 11/28/2016      Reactions   Depakote [valproic Acid]    Divalproex Sodium Other (See Comments)      Medication List    STOP taking these medications   levofloxacin 500 MG tablet Commonly known as:  LEVAQUIN     TAKE these medications   acetaminophen 325 MG tablet Commonly known as:  TYLENOL Take 1 tablet by mouth every 6 (six) hours.   aspirin EC 81 MG tablet Take 1 tablet by mouth daily.   CALCIUM 500/D 500-200 MG-UNIT tablet Generic drug:  calcium-vitamin D Take 1 tablet by mouth 2 (two) times daily.   carbamide peroxide 6.5 % otic solution Commonly known as:  DEBROX Place 5 drops into both ears every 30 (thirty) days.   cephALEXin 500 MG capsule Commonly known as:  KEFLEX Take 1 capsule (500 mg total) by mouth 4 (four) times daily.   cetirizine 10 MG tablet Commonly known as:  ZYRTEC Take 1 tablet by mouth daily as needed.   cyanocobalamin 1000 MCG/ML injection Commonly known as:  (VITAMIN B-12) Inject 1 mL into the muscle every 30 (thirty) days.   eucerin cream Apply 1 application topically daily.   gabapentin 600 MG tablet Commonly known as:  NEURONTIN Take 1 tablet by mouth 4 (four) times daily.   guaiFENesin 100 MG/5ML Soln Commonly known as:  ROBITUSSIN Take 5 mLs by mouth every 4 (four) hours as needed for cough or to loosen phlegm.   hydrochlorothiazide 25 MG tablet Commonly known  as:  HYDRODIURIL Take 1 tablet by mouth daily.   hydrocortisone 25 MG suppository Commonly known as:  ANUSOL-HC Place 25 mg rectally 2 (two) times daily as needed for hemorrhoids or itching.   hydrocortisone-pramoxine rectal foam Commonly known as:  PROCTOFOAM-HC Place 1 applicator rectally 2 (two) times daily.   isosorbide mononitrate 60 MG 24 hr tablet Commonly known as:  IMDUR Take 1 tablet by mouth daily.   loperamide 2 MG  capsule Commonly known as:  IMODIUM Take 2 mg by mouth as needed for diarrhea or loose stools.   LORazepam 0.5 MG tablet Commonly known as:  ATIVAN Take 0.5 mg by mouth every 8 (eight) hours.   magnesium hydroxide 400 MG/5ML suspension Commonly known as:  MILK OF MAGNESIA Take 5 mLs by mouth as needed.   MALDROXAL ANTACID PO Take by mouth as needed.   meloxicam 7.5 MG tablet Commonly known as:  MOBIC Take 7.5 mg by mouth daily.   metoprolol succinate 25 MG 24 hr tablet Commonly known as:  TOPROL-XL Take 1 tablet by mouth daily.   miconazole 2 % powder Commonly known as:  MICOTIN Apply 1 application topically 2 (two) times daily.   mupirocin ointment 2 % Commonly known as:  BACTROBAN Place 1 application into the nose 3 (three) times daily.   nitroGLYCERIN 0.4 MG SL tablet Commonly known as:  NITROSTAT Place 1 tablet under the tongue as needed.   nystatin powder Generic drug:  nystatin Apply 1 g topically as needed.   omeprazole 20 MG capsule Commonly known as:  PRILOSEC Take 1 capsule by mouth daily.   PHENObarbital 20 MG/5ML elixir Take 15 mLs by mouth at bedtime.   phenytoin 100 MG ER capsule Commonly known as:  DILANTIN Take 260 mg by mouth daily.   phenytoin 100 MG ER capsule Commonly known as:  DILANTIN Take 300 mg by mouth at bedtime.   STOOL SOFTENER 100 MG capsule Generic drug:  docusate sodium Take 1 capsule by mouth 2 (two) times daily.   terbinafine 1 % cream Commonly known as:  LAMISIL Apply 1 application topically 2 (two) times daily.   tolnaftate 1 % spray Commonly known as:  TINACTIN Apply 1 application topically 2 (two) times daily.   traMADol 50 MG tablet Commonly known as:  ULTRAM Take 1 tablet (50 mg total) by mouth every 6 (six) hours as needed.   triamcinolone cream 0.1 % Commonly known as:  KENALOG Apply 1 application topically 2 (two) times daily.        DISCHARGE INSTRUCTIONS:  See  AVS  If you experience  worsening of your admission symptoms, develop shortness of breath, life threatening emergency, suicidal or homicidal thoughts you must seek medical attention immediately by calling 911 or calling your MD immediately  if symptoms less severe.  You Must read complete instructions/literature along with all the possible adverse reactions/side effects for all the Medicines you take and that have been prescribed to you. Take any new Medicines after you have completely understood and accpet all the possible adverse reactions/side effects.   Please note  You were cared for by a hospitalist during your hospital stay. If you have any questions about your discharge medications or the care you received while you were in the hospital after you are discharged, you can call the unit and asked to speak with the hospitalist on call if the hospitalist that took care of you is not available. Once you are discharged, your primary care physician will handle  any further medical issues. Please note that NO REFILLS for any discharge medications will be authorized once you are discharged, as it is imperative that you return to your primary care physician (or establish a relationship with a primary care physician if you do not have one) for your aftercare needs so that they can reassess your need for medications and monitor your lab values.    On the day of Discharge:  VITAL SIGNS:  Blood pressure (!) 103/49, pulse 82, temperature 98.3 F (36.8 C), resp. rate 20, height 5\' 2"  (1.575 m), weight 116 lb 9.6 oz (52.9 kg), SpO2 91 %. PHYSICAL EXAMINATION:  GENERAL:  76 y.o.-year-old patient lying in the bed with no acute distress.  EYES: Pupils equal, round, reactive to light and accommodation. No scleral icterus. Extraocular muscles intact.  HEENT: Head atraumatic, normocephalic. Oropharynx and nasopharynx clear.  NECK:  Supple, no jugular venous distention. No thyroid enlargement, no tenderness.  LUNGS: Normal breath sounds  bilaterally, no wheezing, rales,rhonchi or crepitation. No use of accessory muscles of respiration.  CARDIOVASCULAR: S1, S2 normal. No murmurs, rubs, or gallops.  ABDOMEN: Soft, non-tender, non-distended. Bowel sounds present. No organomegaly or mass.  EXTREMITIES: No pedal edema, cyanosis, or clubbing.  NEUROLOGIC:unable to exam. PSYCHIATRIC: The patient is demented. SKIN: No obvious rash, lesion, or ulcer.  DATA REVIEW:   CBC  Recent Labs Lab 11/27/16 0710 11/28/16 0313  WBC 7.6  --   HGB 8.9* 8.8*  HCT 27.9*  --   PLT 277  --     Chemistries   Recent Labs Lab 11/26/16 1318 11/26/16 1436 11/27/16 0710  NA 141  --  137  K 2.9*  --  4.3  CL 106  --  106  CO2 28  --  27  GLUCOSE 100*  --  92  BUN 25*  --  22*  CREATININE 0.66  --  0.52  CALCIUM 8.3*  --  8.4*  MG  --  1.7  --   AST 15  --   --   ALT 7*  --   --   ALKPHOS 121  --   --   BILITOT 0.4  --   --      Microbiology Results  Results for orders placed or performed in visit on 11/06/16  GC/Chlamydia Probe Amp     Status: None   Collection Time: 11/06/16  1:00 AM  Result Value Ref Range Status   Chlamydia trachomatis, NAA Negative Negative Final   Neisseria gonorrhoeae by PCR Negative Negative Final    RADIOLOGY:  No results found.   Management plans discussed with the patient, family and they are in agreement.  CODE STATUS: Full Code   TOTAL TIME TAKING CARE OF THIS PATIENT: 33 minutes.    Shaune Pollackhen, Vennie Salsbury M.D on 11/28/2016 at 12:56 PM  Between 7am to 6pm - Pager - [phone]  After 6pm go to [email or web address] - Social research officer, governmentpassword EPAS [facility]  [facility]  Office  [phone]  CC: Primary care physician; Lauro RegulusANDERSON,MARSHALL W., MD   Note: This dictation was prepared with Dragon dictation along with smaller phrase technology. Any transcriptional errors that result from this process are unintentional.

## 2016-11-28 NOTE — [place] FL2 (Signed)
[place] MEDICAID FL2 LEVEL OF CARE SCREENING TOOL     IDENTIFICATION  Patient Name: Hannah Vaughan Birthdate: 07-09-1941 Sex: female Admission Date (Current Location): 11/26/2016  [place] and [place] Number:  [address] [id number] [facility] Facility and Address:  [facility], [address] [place], [place] [address]      Provider Number: [id number]  Attending Physician Name and Address:  Shaune Pollack, MD  Relative Name and Phone Number:  [name], [name] [phone]     Current Level of Care: Hospital Recommended Level of Care: Other (Comment) [name]) Prior Approval Number:    Date Approved/Denied:   PASRR Number:    Discharge Plan: Other (Comment) [place])    Current Diagnoses: Patient Active Problem List   Diagnosis Date Noted  . NSTEMI (non-ST elevated myocardial infarction) (HCC) 11/26/2016  . Acute respiratory failure with hypoxemia (HCC) 05/06/2015  . HTN (hypertension) 05/06/2015  . Seizure disorder (HCC) 05/06/2015  . CAD (coronary artery disease) 05/06/2015  . Osteoarthritis 05/06/2015  . GERD (gastroesophageal reflux disease) 05/06/2015  . HCAP (healthcare-associated pneumonia) 05/06/2015  . Abnormal ECG 06/24/2014  . 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 Dependent edema 06/06/2014    Orientation RESPIRATION BLADDER Height & Weight     Self  Normal Incontinent Weight: 116 lb 9.6 oz (52.9 kg) Height:  5\' 2"  (157.5 cm)  BEHAVIORAL SYMPTOMS/MOOD NEUROLOGICAL BOWEL NUTRITION STATUS      Continent Diet (Cardiac diet)  AMBULATORY STATUS COMMUNICATION OF NEEDS Skin   Extensive Assist Verbally Normal                       Personal Care Assistance Level of Assistance  Bathing, Feeding, Dressing Bathing Assistance: Limited assistance Feeding assistance: Limited assistance Dressing Assistance: Limited assistance     Functional Limitations Info  Sight, Hearing, Speech Sight Info: Adequate Hearing Info:  Adequate Speech Info: Adequate    SPECIAL CARE FACTORS FREQUENCY                       Contractures Contractures Info: Not present    Additional Factors Info  Code Status, Allergies Code Status Info: Full Code Allergies Info: DEPAKOTE VALPROIC ACID, DIVALPROEX SODIUM            Current Medications (11/28/2016):  This is the current hospital active medication list Current Facility-Administered Medications  Medication Dose Route Frequency Provider Last Rate Last Dose  . 0.9 %  sodium chloride infusion   Intravenous Once Srikar Sudini, MD      . acetaminophen (TYLENOL) tablet 650 mg  650 mg Oral Q6H PRN Milagros Loll, MD       Or  . acetaminophen (TYLENOL) suppository 650 mg  650 mg Rectal Q6H PRN Srikar Sudini, MD      . albuterol (PROVENTIL) (2.5 MG/3ML) 0.083% nebulizer solution 2.5 mg  2.5 mg Nebulization Q2H PRN Srikar Sudini, MD      . aspirin EC tablet 81 mg  81 mg Oral Daily Milagros Loll, MD   81 mg at 11/28/16 0848  . bisacodyl (DULCOLAX) suppository 10 mg  10 mg Rectal Daily PRN Srikar Sudini, MD      . cefTRIAXone (ROCEPHIN) 1 g in dextrose 5 % 50 mL IVPB  1 g Intravenous Q24H Shaune Pollack, MD   1 g at 11/28/16 0955  . docusate sodium (COLACE) capsule 100 mg  100 mg Oral BID Milagros Loll, MD   100 mg at 11/28/16 0849  .  gabapentin (NEURONTIN) tablet 600 mg  600 mg Oral QID Milagros LollSrikar Sudini, MD   600 mg at 11/28/16 1412  . guaiFENesin (ROBITUSSIN) 100 MG/5ML solution 100 mg  5 mL Oral Q4H PRN Srikar Sudini, MD      . hydrocortisone-pramoxine (PROCTOFOAM-HC) rectal foam 1 applicator  1 applicator Rectal BID PRN Srikar Sudini, MD      . isosorbide mononitrate (IMDUR) 24 hr tablet 60 mg  60 mg Oral Daily Milagros LollSrikar Sudini, MD   60 mg at 11/28/16 0848  . lactulose (CHRONULAC) 10 GM/15ML solution 20 g  20 g Oral Daily PRN Shaune PollackQing Genesys Coggeshall, MD      . loperamide (IMODIUM) capsule 2 mg  2 mg Oral PRN Srikar Sudini, MD      . LORazepam (ATIVAN) tablet 0.5 mg  0.5 mg Oral Q8H Srikar Sudini,  MD   0.5 mg at 11/28/16 1412  . metoprolol succinate (TOPROL-XL) 24 hr tablet 25 mg  25 mg Oral Daily Milagros LollSrikar Sudini, MD   25 mg at 11/28/16 0848  . ondansetron (ZOFRAN) tablet 4 mg  4 mg Oral Q6H PRN Milagros LollSrikar Sudini, MD       Or  . ondansetron (ZOFRAN) injection 4 mg  4 mg Intravenous Q6H PRN Srikar Sudini, MD      . pantoprazole (PROTONIX) EC tablet 40 mg  40 mg Oral Daily Milagros LollSrikar Sudini, MD   40 mg at 11/28/16 0848  . PHENObarbital 20 MG/5ML elixir 60 mg  60 mg Oral QHS Milagros LollSrikar Sudini, MD   60 mg at 11/27/16 2232  . phenytoin (DILANTIN) ER capsule 300 mg  300 mg Oral QHS Srikar Sudini, MD   300 mg at 11/27/16 2231  . polyethylene glycol (MIRALAX / GLYCOLAX) packet 17 g  17 g Oral Daily PRN Srikar Sudini, MD      . potassium chloride (KLOR-CON) packet 40 mEq  40 mEq Oral Once Henry ScheinSrikar Sudini, MD      . sodium chloride 0.9 % bolus 500 mL  500 mL Intravenous Once Srikar Sudini, MD      . sodium chloride flush (NS) 0.9 % injection 3 mL  3 mL Intravenous Q12H Milagros LollSrikar Sudini, MD   3 mL at 11/28/16 [id number]     Discharge Medications: Please see discharge summary for a list of discharge medications.  Relevant Imaging Results:  Relevant Lab Results:   Additional Information SSN [id number]  [name], [name], [place]

## 2016-11-28 NOTE — Discharge Instructions (Signed)
Heart healthy diet. Aspiration and fall precaution.

## 2016-11-28 NOTE — Clinical Social Work Note (Addendum)
Clinical Social Work Assessment  Patient Details  Name: Hannah Vaughan MRN: [id number] Date of Birth: 1941/03/15  Date of referral:  11/28/16               Reason for consult:  Facility Placement                Permission sought to share informatOneal [place] with:  Facility Medical sales representativeContact Representative Permission granted to share information::  Yes, Verbal Permission Granted  Name::     [place] CharonSmith,David Brother [phone]   Agency::     Relationship::     Contact Information:     Housing/Transportation Living arrangements for the past 2 months:  Group Home Source of Information:  Medical Team Patient Interpreter Needed:  None Criminal Activity/Legal Involvement Pertinent to Current Situation/Hospitalization:  No - Comment as needed Significant Relationships:  Other Family Members Lives with:  Other (Comment) [name] Do you feel safe going back to the place where you live?  Yes Need for family participation in patient care:  Yes (Comment)  Care giving concerns:  Patient did not express any concerns about returning to group home.   Social Worker assessment / plan:  Patient is a 76 year old female who is alert and oriented x1.  [facility] from [name].  Patient's group home did not express any concerns about having patient return to facility.  Patient has been living a facility for several years.  Patient's caregivers are able to provide support for patient.  No other issues or concerns.  Employment status:  Retired Health and safety inspectornsurance information:  Armed forces operational officerMedicare, Medicaid In [place] PT Recommendations:  Not assessed at this time Information / Referral to community resources:     Patient/Family's Response to care:  Patient in agreement to returning back to group home.  Patient/Family's Understanding of and Emotional Response to Diagnosis, Current Treatment, and Prognosis:  Patient did not express any concerns regarding current treatment plan.  Emotional  Assessment Appearance:  Appears stated age Attitude/Demeanor/Rapport:    Affect (typically observed):  Appropriate, Calm Orientation:  Oriented to Self Alcohol / Substance use:  Not Applicable Psych involvement (Current and /or in the community):  No (Comment)  Discharge Needs  Concerns to be addressed:  No discharge needs identified Readmission within the last 30 days:  No Current discharge risk:  None Barriers to Discharge:  No Barriers Identified   [place] Constablenterhaus, Janylah Belgrave R, LCSWA 11/28/2016, 4:35 PM

## 2016-11-28 NOTE — Clinical Social Work Note (Signed)
Patient to be d/c'ed today to [name].  Patient and family agreeable to plans will transport via group home staff.  Windell MouldingEric Lamees Gable, MSW, Theresia MajorsLCSWA [phone]

## 2016-12-12 DIAGNOSIS — D509 Iron deficiency anemia, unspecified: Secondary | ICD-10-CM | POA: Insufficient documentation

## 2017-01-24 ENCOUNTER — Emergency Department
Admission: EM | Admit: 2017-01-24 | Discharge: 2017-01-25 | Disposition: A | Discharge status: Home / Self Care | Payer: Medicare Other | Attending: Emergency Medicine | Admitting: Emergency Medicine

## 2017-01-24 DIAGNOSIS — Z7902 Long term (current) use of antithrombotics/antiplatelets: Secondary | ICD-10-CM | POA: Insufficient documentation

## 2017-01-24 DIAGNOSIS — I2510 Atherosclerotic heart disease of native coronary artery without angina pectoris: Secondary | ICD-10-CM | POA: Insufficient documentation

## 2017-01-24 DIAGNOSIS — Z79899 Other long term (current) drug therapy: Secondary | ICD-10-CM | POA: Diagnosis not present

## 2017-01-24 DIAGNOSIS — R112 Nausea with vomiting, unspecified: Secondary | ICD-10-CM | POA: Insufficient documentation

## 2017-01-24 DIAGNOSIS — R1110 Vomiting, unspecified: Secondary | ICD-10-CM | POA: Diagnosis present

## 2017-01-24 DIAGNOSIS — F79 Unspecified intellectual disabilities: Secondary | ICD-10-CM | POA: Diagnosis not present

## 2017-01-24 DIAGNOSIS — I10 Essential (primary) hypertension: Secondary | ICD-10-CM | POA: Insufficient documentation

## 2017-01-24 DIAGNOSIS — N3000 Acute cystitis without hematuria: Secondary | ICD-10-CM

## 2017-01-24 LAB — CBC WITH DIFFERENTIAL/PLATELET
BASOS ABS: 0.1 10*3/uL (ref 0–0.1)
BASOS PCT: 1 %
EOS ABS: 0.1 10*3/uL (ref 0–0.7)
EOS PCT: 1 %
HCT: 31.5 % — ABNORMAL LOW (ref 35.0–47.0)
Hemoglobin: 9.9 g/dL — ABNORMAL LOW (ref 12.0–16.0)
Lymphocytes Relative: 17 %
Lymphs Abs: 1.5 10*3/uL (ref 1.0–3.6)
MCH: 21.7 pg — ABNORMAL LOW (ref 26.0–34.0)
MCHC: 31.4 g/dL — AB (ref 32.0–36.0)
MCV: 69.1 fL — ABNORMAL LOW (ref 80.0–100.0)
MONO ABS: 0.6 10*3/uL (ref 0.2–0.9)
Monocytes Relative: 7 %
NEUTROS ABS: 6.5 10*3/uL (ref 1.4–6.5)
Neutrophils Relative %: 74 %
PLATELETS: 282 10*3/uL (ref 150–440)
RBC: 4.56 MIL/uL (ref 3.80–5.20)
RDW: 25.6 % — AB (ref 11.5–14.5)
WBC: 8.8 10*3/uL (ref 3.6–11.0)

## 2017-01-24 LAB — COMPREHENSIVE METABOLIC PANEL
ALBUMIN: 3.1 g/dL — AB (ref 3.5–5.0)
ALK PHOS: 133 U/L — AB (ref 38–126)
ALT: 11 U/L — AB (ref 14–54)
AST: 20 U/L (ref 15–41)
Anion gap: 8 (ref 5–15)
BUN: 25 mg/dL — ABNORMAL HIGH (ref 6–20)
CALCIUM: 9.3 mg/dL (ref 8.9–10.3)
CHLORIDE: 102 mmol/L (ref 101–111)
CO2: 31 mmol/L (ref 22–32)
CREATININE: 0.51 mg/dL (ref 0.44–1.00)
GFR calc Af Amer: >60 mL/min (ref >60)
GFR calc non Af Amer: >60 mL/min (ref >60)
Glucose, Bld: 120 mg/dL — ABNORMAL HIGH (ref 65–99)
Potassium: 3 mmol/L — ABNORMAL LOW (ref 3.5–5.1)
SODIUM: 141 mmol/L (ref 135–145)
Total Bilirubin: 0.5 mg/dL (ref 0.3–1.2)
Total Protein: 7.3 g/dL (ref 6.5–8.1)

## 2017-01-24 LAB — LIPASE, BLOOD: Lipase: 20 U/L (ref 11–51)

## 2017-01-24 NOTE — ED Provider Notes (Signed)
[facility] Emergency Department Provider Note    ____________________________________________   I have reviewed the triage vital signs and the nursing notes.   HISTORY  Chief Complaint Emesis   History limited by: Mental retardation   HPI Hannah Vaughan is a 76 y.o. female who presents to the emergency department today because of concerns for vomiting. Patient apparently has not been able to tolerate any oral intake today. Caretakers state that she has been vomiting. Additionally the patient has been vomiting up clear fluid. She has had some associated cough. The patient was complaining of some pain in the upper abdomen. They have not noticed any fevers.   Past Medical History:  Diagnosis Date  . CAD (coronary artery disease)   . GERD (gastroesophageal reflux disease)   . History of hiatal hernia   . HTN (hypertension)   . Mental retardation   . Osteoarthritis   . Pneumococcus infection   . Seasonal allergies   . Seizure (HCC)   . Seizure [facility]     Patient Active Problem List   Diagnosis Date Noted  . NSTEMI (non-ST elevated myocardial infarction) (HCC) 11/26/2016  . Acute respiratory failure with hypoxemia (HCC) 05/06/2015  . HTN (hypertension) 05/06/2015  . Seizure disorder (HCC) 05/06/2015  . CAD (coronary artery disease) 05/06/2015  . Osteoarthritis 05/06/2015  . GERD (gastroesophageal reflux disease) 05/06/2015  . HCAP (healthcare-associated pneumonia) 05/06/2015  . Abnormal ECG 06/24/2014  . 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 Dependent edema 06/06/2014    Past Surgical History:  Procedure Laterality Date  . CHOLECYSTECTOMY      Prior to Admission medications   Medication Sig Start Date End Date Taking? Authorizing Provider  acetaminophen (TYLENOL) 325 MG tablet Take 1 tablet by mouth every 6 (six) hours.   Yes [provider]  Aluminum & Magnesium Hydroxide (MALDROXAL ANTACID PO) Take by mouth as needed.   Yes [provider]  aspirin EC 81 MG tablet Take 1 tablet by mouth daily. 11/11/14  Yes [provider]  calcium-vitamin D (CALCIUM 500/D) 500-200 MG-UNIT per tablet Take 1 tablet by mouth 2 (two) times daily.    Yes [provider]  carbamide peroxide (DEBROX) 6.5 % otic solution Place 5 drops into both ears every 30 (thirty) days.   Yes [provider]  cetirizine (ZYRTEC) 10 MG tablet Take 1 tablet by mouth daily as needed. 03/25/14  Yes [provider]  cyanocobalamin (,VITAMIN B-12,) 1000 MCG/ML injection Inject 1 mL into the muscle every 30 (thirty) days.   Yes [provider]  docusate sodium (STOOL SOFTENER) 100 MG capsule Take 1 capsule by mouth 2 (two) times daily.   Yes [provider]  gabapentin (NEURONTIN) 600 MG tablet Take 1 tablet by mouth 4 (four) times daily.  06/11/14  Yes [provider]  guaiFENesin (ROBITUSSIN) 100 MG/5ML SOLN Take 5 mLs by mouth every 4 (four) hours as needed for cough or to loosen phlegm.   Yes [provider]  hydrochlorothiazide (HYDRODIURIL) 25 MG tablet Take 1 tablet by mouth daily.   Yes [provider]  hydrocortisone (ANUSOL-HC) 25 MG suppository Place 25 mg rectally 2 (two) times daily as needed for hemorrhoids or itching.   Yes [provider]  hydrocortisone-pramoxine (PROCTOFOAM-HC) rectal foam Place 1 applicator rectally 2 (two) times daily.   Yes [provider]  iron polysaccharides (NIFEREX) 150 MG capsule Take 150 mg by mouth daily. 12/12/16 12/12/17 Yes [provider]  isosorbide mononitrate (IMDUR)  60 MG 24 hr tablet Take 1 tablet by mouth daily. 02/11/14  Yes [provider]  loperamide (IMODIUM) 2 MG capsule Take 2 mg by mouth as needed for diarrhea or loose stools.   Yes [provider]  LORazepam (ATIVAN) 0.5 MG tablet Take 0.5 mg by mouth every 8 (eight) hours.   Yes [provider]  magnesium hydroxide (MILK OF  MAGNESIA) 400 MG/5ML suspension Take 5 mLs by mouth as needed.   Yes [provider]  metoprolol succinate (TOPROL-XL) 25 MG 24 hr tablet Take 1 tablet by mouth daily.   Yes [provider]  miconazole (MICOTIN) 2 % powder Apply 1 application topically 2 (two) times daily.   Yes [provider]  mupirocin ointment (BACTROBAN) 2 % Place 1 application into the nose 3 (three) times daily.   Yes [provider]  nitroGLYCERIN (NITROSTAT) 0.4 MG SL tablet Place 1 tablet under the tongue as needed. 04/06/15  Yes [provider]  nystatin (NYSTATIN) powder Apply 1 g topically as needed.   Yes [provider]  omeprazole (PRILOSEC) 20 MG capsule Take 1 capsule by mouth daily. 01/06/15  Yes [provider]  PHENObarbital 20 MG/5ML elixir Take 15 mLs by mouth at bedtime.   Yes [provider]  Skin Protectants, Misc. (EUCERIN) cream Apply 1 application topically daily.   Yes [provider]  terbinafine (LAMISIL) 1 % cream Apply 1 application topically 2 (two) times daily.   Yes [provider]  tolnaftate (TINACTIN) 1 % spray Apply 1 application topically 2 (two) times daily.   Yes [provider]  traMADol (ULTRAM) 50 MG tablet Take 1 tablet (50 mg total) by mouth every 6 (six) hours as needed. 05/09/15  Yes Enid BaasKalisetti, Radhika, MD  triamcinolone cream (KENALOG) 0.1 % Apply 1 application topically 2 (two) times daily.    Yes [provider]  cephALEXin (KEFLEX) 500 MG capsule Take 1 capsule (500 mg total) by mouth 4 (four) times daily. Patient not taking: Reported on 01/24/2017 11/28/16   Shaune Pollackhen, Qing, MD    Allergies Depakote [valproic acid] and Divalproex sodium  Family History  Problem Relation Age of Onset  . CAD Mother   . Congestive Heart Failure Mother   . CVA Father   . Heart attack Brother   . Hypertension Sister   . Diabetes Sister     Social History Social History  Substance Use  Topics  . Smoking status: Never Smoker  . Smokeless tobacco: Never Used  . Alcohol use No    Review of Systems Constitutional: No fever/chills Eyes: No visual changes. ENT: No sore throat. Cardiovascular: Denies chest pain. Respiratory: Denies shortness of breath. Gastrointestinal: No abdominal pain.  No nausea, no vomiting.  No diarrhea.   Genitourinary: Negative for dysuria. Musculoskeletal: Negative for back pain. Skin: Negative for rash. Neurological: Negative for headaches, focal weakness or numbness.  ____________________________________________   PHYSICAL EXAM:  VITAL SIGNS: ED Triage Vitals  Enc Vitals Group     BP 01/24/17 2117 136/61     Pulse Rate 01/24/17 2117 80     Resp 01/24/17 2117 18     Temp 01/24/17 2117 98.8 F (37.1 C)     Temp Source 01/24/17 2117 Oral     SpO2 01/24/17 2117 95 %     Weight 01/24/17 2119 108 lb (49 kg)    Constitutional: Awake and alert, does not appear in any acute distress. Eyes: Conjunctivae are normal.  ENT  Head: Normocephalic and atraumatic.   Nose: No congestion/rhinnorhea.   Mouth/Throat: Mucous membranes are moist.   Neck: No stridor. Hematological/Lymphatic/Immunilogical: No cervical lymphadenopathy. Cardiovascular: Normal rate, regular rhythm.  No murmurs, rubs, or gallops.  Respiratory: Normal respiratory effort without tachypnea nor retractions. Breath sounds are clear and equal bilaterally. No wheezes/rales/rhonchi. Gastrointestinal: Soft and non tender. No rebound. No guarding.  Genitourinary: Deferred Musculoskeletal: Normal range of motion in all extremities. No lower extremity edema. Neurologic:  Mental retardation. Awake and alert. Not completely oriented. Appears to move all extremities.  Skin:  Skin is warm, dry and intact. No rash noted.  ____________________________________________    LABS (pertinent positives/negatives)  Labs Reviewed  CBC WITH DIFFERENTIAL/PLATELET - Abnormal;  Notable for the following:       Result Value   Hemoglobin 9.9 (*)    HCT 31.5 (*)    MCV 69.1 (*)    MCH 21.7 (*)    MCHC 31.4 (*)    RDW 25.6 (*)    All other components within normal limits  COMPREHENSIVE METABOLIC PANEL - Abnormal; Notable for the following:    Potassium 3.0 (*)    Glucose, Bld 120 (*)    BUN 25 (*)    Albumin 3.1 (*)    ALT 11 (*)    Alkaline Phosphatase 133 (*)    All other components within normal limits  LIPASE, BLOOD  URINALYSIS, COMPLETE (UACMP) WITH MICROSCOPIC  TROPONIN I     ____________________________________________   EKG  None  ____________________________________________    RADIOLOGY  None  ____________________________________________   PROCEDURES  Procedures  ____________________________________________   INITIAL IMPRESSION / ASSESSMENT AND PLAN / ED COURSE  Pertinent labs & imaging results that were available during my care of the patient were reviewed by me and considered in my medical decision making (see chart for details).  Patient presented to the emergency department today because of family's concern for nausea and vomiting. Patient of course I cannot give a good history., Will exam however is benign. Will plan on checking blood work and urine.  ____________________________________________   FINAL CLINICAL IMPRESSION(S) / ED DIAGNOSES  Nausea and vomiting  Note: This dictation was prepared with Dragon dictation. Any transcriptional errors that result from this process are unintentional     Phineas Semen, MD 01/25/17 0000

## 2017-01-24 NOTE — ED Triage Notes (Signed)
Pt in with co vomiting since today, caretakers state it is clear and happens after she coughs. Unsure if the patient is vomiting or coughing.

## 2017-01-25 DIAGNOSIS — R112 Nausea with vomiting, unspecified: Secondary | ICD-10-CM | POA: Diagnosis not present

## 2017-01-25 LAB — TROPONIN I: TROPONIN I: 0.07 ng/mL — AB (ref <0.03)

## 2017-01-25 LAB — URINALYSIS, COMPLETE (UACMP) WITH MICROSCOPIC
BILIRUBIN URINE: NEGATIVE
Glucose, UA: NEGATIVE mg/dL
KETONES UR: NEGATIVE mg/dL
Nitrite: POSITIVE — AB
Protein, ur: 100 mg/dL — AB
SPECIFIC GRAVITY, URINE: 1.021 (ref 1.005–1.030)
pH: 6 (ref 5.0–8.0)

## 2017-01-25 MED ORDER — ONDANSETRON HCL 4 MG PO TABS: 4.0000 mg | ORAL_TABLET | Freq: Every day | ORAL | 0 refills | Status: DC | PRN

## 2017-01-25 MED ORDER — CEPHALEXIN 500 MG PO CAPS: 500.0000 mg | ORAL_CAPSULE | Freq: Three times a day (TID) | ORAL | 0 refills | Status: AC

## 2017-01-25 MED ORDER — CEPHALEXIN 500 MG PO CAPS
500.0000 mg | ORAL_CAPSULE | Freq: Once | ORAL | Status: AC
  Administered 2017-01-25: 500 mg via ORAL
  Filled 2017-01-25: qty 1

## 2017-01-25 MED ORDER — ONDANSETRON HCL 4 MG/2ML IJ SOLN
4.0000 mg | Freq: Once | INTRAMUSCULAR | Status: AC
  Administered 2017-01-25: 4 mg via INTRAVENOUS
  Filled 2017-01-25: qty 2

## 2017-01-25 NOTE — ED Provider Notes (Signed)
Signout from Dr. Derrill KayGoodman in this 76 year old female with cognitive impairment who presented with vomiting today. Plan is to follow up with the patient's troponin as well as urinalysis.  Physical Exam  BP 103/67   Pulse 80   Temp 98.8 F (37.1 C) (Oral)   Resp 18   Wt 108 lb (49 kg)   SpO2 95%   BMI 19.75 kg/m  ----------------------------------------- 2:18 AM on 01/25/2017 -----------------------------------------   Physical Exam Patient without any distress at this time. Family/caretakers at the bedside say that the patient has not had any retching or vomiting here in the emergency department. ED Course  Procedures  MDM Urinalysis appears as a UTI. Elevated troponin but this appears to be lower than the patient's past troponins. Likely UTI causing the symptoms. We will give the patient Zofran as well as Keflex in the emergency department here. As long she tolerates by mouth she will to be discharged home. I explained this plan and the family/caretakers and they're understanding willing to comply.      Myrna BlazerSchaevitz, Tatym Schermer Matthew, MD 01/25/17 [phone]

## 2017-01-25 NOTE — ED Notes (Signed)
Date and time results received: 01/25/17 1:36 AM  Test: troponin  Critical Value: 0.07  Name of Provider Notified: Dr. Pershing ProudSchaevitz   Orders Received? Or Actions Taken?:MD made aware

## 2017-01-26 LAB — URINE CULTURE

## 2017-03-30 ENCOUNTER — Emergency Department: Payer: Medicare Other

## 2017-03-30 ENCOUNTER — Emergency Department
Admission: EM | Admit: 2017-03-30 | Discharge: 2017-03-31 | Disposition: A | Discharge status: Home / Self Care | Payer: Medicare Other | Source: Home / Self Care | Attending: Emergency Medicine | Admitting: Emergency Medicine

## 2017-03-30 ENCOUNTER — Encounter: Payer: Self-pay | Admitting: Emergency Medicine

## 2017-03-30 DIAGNOSIS — G40909 Epilepsy, unspecified, not intractable, without status epilepticus: Secondary | ICD-10-CM | POA: Diagnosis not present

## 2017-03-30 DIAGNOSIS — Z8249 Family history of ischemic heart disease and other diseases of the circulatory system: Secondary | ICD-10-CM | POA: Diagnosis not present

## 2017-03-30 DIAGNOSIS — R1310 Dysphagia, unspecified: Secondary | ICD-10-CM | POA: Diagnosis not present

## 2017-03-30 DIAGNOSIS — F79 Unspecified intellectual disabilities: Secondary | ICD-10-CM

## 2017-03-30 DIAGNOSIS — Z79899 Other long term (current) drug therapy: Secondary | ICD-10-CM

## 2017-03-30 DIAGNOSIS — I10 Essential (primary) hypertension: Secondary | ICD-10-CM | POA: Insufficient documentation

## 2017-03-30 DIAGNOSIS — Z7982 Long term (current) use of aspirin: Secondary | ICD-10-CM

## 2017-03-30 DIAGNOSIS — K219 Gastro-esophageal reflux disease without esophagitis: Secondary | ICD-10-CM | POA: Diagnosis not present

## 2017-03-30 DIAGNOSIS — R51 Headache: Secondary | ICD-10-CM | POA: Insufficient documentation

## 2017-03-30 DIAGNOSIS — Z9049 Acquired absence of other specified parts of digestive tract: Secondary | ICD-10-CM | POA: Diagnosis not present

## 2017-03-30 DIAGNOSIS — N3091 Cystitis, unspecified with hematuria: Secondary | ICD-10-CM

## 2017-03-30 DIAGNOSIS — R112 Nausea with vomiting, unspecified: Secondary | ICD-10-CM

## 2017-03-30 DIAGNOSIS — M1990 Unspecified osteoarthritis, unspecified site: Secondary | ICD-10-CM | POA: Diagnosis not present

## 2017-03-30 DIAGNOSIS — N390 Urinary tract infection, site not specified: Secondary | ICD-10-CM | POA: Diagnosis present

## 2017-03-30 DIAGNOSIS — K224 Dyskinesia of esophagus: Secondary | ICD-10-CM | POA: Diagnosis not present

## 2017-03-30 DIAGNOSIS — N3001 Acute cystitis with hematuria: Secondary | ICD-10-CM | POA: Diagnosis not present

## 2017-03-30 DIAGNOSIS — I119 Hypertensive heart disease without heart failure: Secondary | ICD-10-CM | POA: Diagnosis not present

## 2017-03-30 DIAGNOSIS — I700 Atherosclerosis of aorta: Secondary | ICD-10-CM | POA: Diagnosis not present

## 2017-03-30 DIAGNOSIS — B9620 Unspecified Escherichia coli [E. coli] as the cause of diseases classified elsewhere: Secondary | ICD-10-CM | POA: Diagnosis not present

## 2017-03-30 DIAGNOSIS — K449 Diaphragmatic hernia without obstruction or gangrene: Secondary | ICD-10-CM | POA: Diagnosis not present

## 2017-03-30 DIAGNOSIS — I252 Old myocardial infarction: Secondary | ICD-10-CM | POA: Diagnosis not present

## 2017-03-30 DIAGNOSIS — I2510 Atherosclerotic heart disease of native coronary artery without angina pectoris: Secondary | ICD-10-CM | POA: Diagnosis not present

## 2017-03-30 DIAGNOSIS — E876 Hypokalemia: Secondary | ICD-10-CM | POA: Diagnosis not present

## 2017-03-30 DIAGNOSIS — Z888 Allergy status to other drugs, medicaments and biological substances status: Secondary | ICD-10-CM | POA: Diagnosis not present

## 2017-03-30 LAB — CBC
HCT: 35.5 % (ref 35.0–47.0)
Hemoglobin: 11.4 g/dL — ABNORMAL LOW (ref 12.0–16.0)
MCH: 24 pg — ABNORMAL LOW (ref 26.0–34.0)
MCHC: 32.3 g/dL (ref 32.0–36.0)
MCV: 74.5 fL — ABNORMAL LOW (ref 80.0–100.0)
Platelets: 222 10*3/uL (ref 150–440)
RBC: 4.76 MIL/uL (ref 3.80–5.20)
RDW: 21.2 % — ABNORMAL HIGH (ref 11.5–14.5)
WBC: 7.3 10*3/uL (ref 3.6–11.0)

## 2017-03-30 LAB — COMPREHENSIVE METABOLIC PANEL
ALBUMIN: 3.4 g/dL — AB (ref 3.5–5.0)
ALK PHOS: 179 U/L — AB (ref 38–126)
ALT: 12 U/L — AB (ref 14–54)
AST: 20 U/L (ref 15–41)
Anion gap: 8 (ref 5–15)
BILIRUBIN TOTAL: 0.4 mg/dL (ref 0.3–1.2)
BUN: 23 mg/dL — AB (ref 6–20)
CALCIUM: 9.1 mg/dL (ref 8.9–10.3)
CO2: 32 mmol/L (ref 22–32)
CREATININE: 0.67 mg/dL (ref 0.44–1.00)
Chloride: 100 mmol/L — ABNORMAL LOW (ref 101–111)
GFR calc Af Amer: >60 mL/min (ref >60)
GLUCOSE: 127 mg/dL — AB (ref 65–99)
POTASSIUM: 3.4 mmol/L — AB (ref 3.5–5.1)
Sodium: 140 mmol/L (ref 135–145)
TOTAL PROTEIN: 7.4 g/dL (ref 6.5–8.1)

## 2017-03-30 LAB — LIPASE, BLOOD: Lipase: 34 U/L (ref 11–51)

## 2017-03-30 MED ORDER — SODIUM CHLORIDE 0.9 % IV BOLUS (SEPSIS)
500.0000 mL | Freq: Once | INTRAVENOUS | Status: AC
Start: 2017-03-30 — End: 2017-03-31
  Administered 2017-03-30: 500 mL via INTRAVENOUS

## 2017-03-30 MED ORDER — ONDANSETRON HCL 4 MG/2ML IJ SOLN
4.0000 mg | Freq: Once | INTRAMUSCULAR | Status: AC
  Administered 2017-03-30: 4 mg via INTRAVENOUS
  Filled 2017-03-30: qty 2

## 2017-03-30 NOTE — ED Provider Notes (Signed)
[facility] Emergency Department Provider Note   ____________________________________________   First MD Initiated Contact with Patient 03/30/17 2311     (approximate)  I have reviewed the triage vital signs and the nursing notes.   HISTORY  Chief Complaint Emesis  History obtained from patient's caretaker  HPI Hannah Vaughan is a 76 y.o. female with severe mental retardation who comes into the hospital today with vomiting. The patient was at a family function today and started vomiting. The patient's caretaker said it was lots of phlegm. The patient was with her sister initially but her sister is no longer in the emergency department. The patient took a few more bites of food and then vomited again. It lasted a few hours and he decided to bring her in for evaluation. The patient did complain of her head hurting when she arrived here in the emergency department. She is not had any diarrhea nor has she had any abdominal pain. She does not have any injuries that are known. She has no chest pain as well. The patient is minimally verbal. She attempts to speak but is very difficult and the caretaker states that if he did not know her well you cannot understand what she saying. The patient though has not urinated since she's arrived. The patient was brought in for evaluation.   Past Medical History:  Diagnosis Date  . CAD (coronary artery disease)   . GERD (gastroesophageal reflux disease)   . History of hiatal hernia   . HTN (hypertension)   . Mental retardation   . Osteoarthritis   . Pneumococcus infection   . Seasonal allergies   . Seizure (HCC)   . Seizure [facility](HCC)     Patient Active Problem List   Diagnosis Date Noted  . NSTEMI (non-ST elevated myocardial infarction) (HCC) 11/26/2016  . Acute respiratory failure with hypoxemia (HCC) 05/06/2015  . HTN (hypertension) 05/06/2015  . Seizure disorder (HCC) 05/06/2015  . CAD (coronary artery disease)  05/06/2015  . Osteoarthritis 05/06/2015  . GERD (gastroesophageal reflux disease) 05/06/2015  . HCAP (healthcare-associated pneumonia) 05/06/2015  . Abnormal ECG 06/24/2014  . 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 Dependent edema 06/06/2014    Past Surgical History:  Procedure Laterality Date  . CHOLECYSTECTOMY      Prior to Admission medications   Medication Sig Start Date End Date Taking? Authorizing Provider  acetaminophen (TYLENOL) 325 MG tablet Take 1 tablet by mouth every 6 (six) hours.    [provider]  Aluminum & Magnesium Hydroxide (MALDROXAL ANTACID PO) Take by mouth as needed.    [provider]  aspirin EC 81 MG tablet Take 1 tablet by mouth daily. 11/11/14   [provider]  calcium-vitamin D (CALCIUM 500/D) 500-200 MG-UNIT per tablet Take 1 tablet by mouth 2 (two) times daily.     [provider]  carbamide peroxide (DEBROX) 6.5 % otic solution Place 5 drops into both ears every 30 (thirty) days.    [provider]  cephALEXin (KEFLEX) 500 MG capsule Take 1 capsule (500 mg total) by mouth 4 (four) times daily. 03/31/17 04/10/17  Rebecka ApleyWebster, Deby Adger P, MD  cetirizine (ZYRTEC) 10 MG tablet Take 1 tablet by mouth daily as needed. 03/25/14   [provider]  cyanocobalamin (,VITAMIN B-12,) 1000 MCG/ML injection Inject 1 mL into the muscle every 30 (thirty) days.    [provider]  docusate sodium (STOOL SOFTENER) 100 MG capsule Take 1 capsule by mouth 2 (two) times daily.  [provider]  gabapentin (NEURONTIN) 600 MG tablet Take 1 tablet by mouth 4 (four) times daily.  06/11/14   [provider]  guaiFENesin (ROBITUSSIN) 100 MG/5ML SOLN Take 5 mLs by mouth every 4 (four) hours as needed for cough or to loosen phlegm.    [provider]  hydrochlorothiazide (HYDRODIURIL) 25 MG tablet Take 1 tablet by mouth daily.    [provider]  hydrocortisone (ANUSOL-HC) 25 MG suppository Place 25 mg  rectally 2 (two) times daily as needed for hemorrhoids or itching.    [provider]  hydrocortisone-pramoxine [facility]) rectal foam Place 1 applicator rectally 2 (two) times daily.    [provider]  iron polysaccharides (NIFEREX) 150 MG capsule Take 150 mg by mouth daily. 12/12/16 12/12/17  [provider]  isosorbide mononitrate (IMDUR) 60 MG 24 hr tablet Take 1 tablet by mouth daily. 02/11/14   [provider]  loperamide (IMODIUM) 2 MG capsule Take 2 mg by mouth as needed for diarrhea or loose stools.    [provider]  LORazepam (ATIVAN) 0.5 MG tablet Take 0.5 mg by mouth every 8 (eight) hours.    [provider]  magnesium hydroxide (MILK OF MAGNESIA) 400 MG/5ML suspension Take 5 mLs by mouth as needed.    [provider]  metoprolol succinate (TOPROL-XL) 25 MG 24 hr tablet Take 1 tablet by mouth daily.    [provider]  miconazole (MICOTIN) 2 % powder Apply 1 application topically 2 (two) times daily.    [provider]  mupirocin ointment (BACTROBAN) 2 % Place 1 application into the nose 3 (three) times daily.    [provider]  nitroGLYCERIN (NITROSTAT) 0.4 MG SL tablet Place 1 tablet under the tongue as needed. 04/06/15   [provider]  nystatin (NYSTATIN) powder Apply 1 g topically as needed.    [provider]  omeprazole (PRILOSEC) 20 MG capsule Take 1 capsule by mouth daily. 01/06/15   [provider]  ondansetron (ZOFRAN ODT) 4 MG disintegrating tablet Take 1 tablet (4 mg total) by mouth every 8 (eight) hours as needed for nausea or vomiting. 03/31/17   Rebecka Apley, MD  ondansetron (ZOFRAN) 4 MG tablet Take 1 tablet (4 mg total) by mouth daily as needed. 01/25/17   Myrna Blazer, MD  PHENObarbital 20 MG/5ML elixir Take 15 mLs by mouth at bedtime.    [provider]  Skin Protectants, Misc. (EUCERIN) cream Apply 1 application topically  daily.    [provider]  terbinafine (LAMISIL) 1 % cream Apply 1 application topically 2 (two) times daily.    [provider]  tolnaftate (TINACTIN) 1 % spray Apply 1 application topically 2 (two) times daily.    [provider]  traMADol (ULTRAM) 50 MG tablet Take 1 tablet (50 mg total) by mouth every 6 (six) hours as needed. 05/09/15   Enid Baas, MD  triamcinolone cream (KENALOG) 0.1 % Apply 1 application topically 2 (two) times daily.     [provider]    Allergies Depakote [valproic acid] and Divalproex sodium  Family History  Problem Relation Age of Onset  . CAD Mother   . Congestive Heart Failure Mother   . CVA Father   . Heart attack Brother   . Hypertension Sister   . Diabetes Sister     Social History Social History  Substance Use Topics  . Smoking status: Never Smoker  . Smokeless tobacco: Never Used  .  Alcohol use No    Review of Systems  Constitutional: No fever/chills Eyes: No visual changes. ENT: No sore throat. Cardiovascular: Denies chest pain. Respiratory: Denies shortness of breath. Gastrointestinal: Nausea and vomiting No abdominal pain.  No diarrhea.  No constipation. Genitourinary: Negative for dysuria. Musculoskeletal: Negative for back pain. Skin: Negative for rash. Neurological: Headache   ____________________________________________   PHYSICAL EXAM:  VITAL SIGNS: ED Triage Vitals  Enc Vitals Group     BP 03/30/17 1807 (!) 158/81     Pulse Rate 03/30/17 1807 65     Resp 03/30/17 1807 18     Temp 03/30/17 1807 98.8 F (37.1 C)     Temp Source 03/30/17 1807 Oral     SpO2 03/30/17 1807 99 %     Weight 03/30/17 1808 119 lb (54 kg)     Height 03/30/17 1808 4\' 11"  (1.499 m)     Head Circumference --      Peak Flow --      Pain Score --      Pain Loc --      Pain Edu? --      Excl. in GC? --     Constitutional: Alert and oriented. Well appearing and in Mild distress Eyes: Conjunctivae  are normal. PERRL. EOMI. Head: Atraumatic. Nose: No congestion/rhinnorhea. Mouth/Throat: Mucous membranes are moist.  Oropharynx non-erythematous. Cardiovascular: Normal rate, regular rhythm. Systolic murmur auscultated.  Good peripheral circulation. Respiratory: Normal respiratory effort.  No retractions. Lungs CTAB. Gastrointestinal: Soft and nontender. No distention. Positive bowel sounds Musculoskeletal: No lower extremity tenderness nor edema.   Neurologic:  Normal speech and language.  Skin:  Skin is warm, dry and intact.  Psychiatric: Mood and affect are normal.   ____________________________________________   LABS (all labs ordered are listed, but only abnormal results are displayed)  Labs Reviewed  COMPREHENSIVE METABOLIC PANEL - Abnormal; Notable for the following:       Result Value   Potassium 3.4 (*)    Chloride 100 (*)    Glucose, Bld 127 (*)    BUN 23 (*)    Albumin 3.4 (*)    ALT 12 (*)    Alkaline Phosphatase 179 (*)    All other components within normal limits  CBC - Abnormal; Notable for the following:    Hemoglobin 11.4 (*)    MCV 74.5 (*)    MCH 24.0 (*)    RDW 21.2 (*)    All other components within normal limits  URINALYSIS, COMPLETE (UACMP) WITH MICROSCOPIC - Abnormal; Notable for the following:    Color, Urine AMBER (*)    APPearance CLOUDY (*)    Hgb urine dipstick LARGE (*)    Protein, ur 100 (*)    Leukocytes, UA LARGE (*)    Bacteria, UA MANY (*)    Squamous Epithelial / LPF 0-5 (*)    All other components within normal limits  URINE CULTURE  LIPASE, BLOOD   ____________________________________________  EKG  none ____________________________________________  RADIOLOGY  Ct Head Wo Contrast  Result Date: 03/31/2017 CLINICAL DATA:  76 year old female with headache and vomiting. EXAM: CT HEAD WITHOUT CONTRAST TECHNIQUE: Contiguous axial images were obtained from the base of the skull through the vertex without intravenous contrast.  COMPARISON:  Head CT dated 01/17/2009 FINDINGS: Brain: There is moderate age-related atrophy and chronic microvascular ischemic changes. There is no acute intracranial hemorrhage. No mass effect or midline shift noted. No extra-axial fluid collection. Vascular: No hyperdense vessel or unexpected calcification. Skull: Normal.  Negative for fracture or focal lesion. There is hyperostosis frontalis. Sinuses/Orbits: No acute finding. Other: Multiple exophytic high attenuating lesion in the scalp again noted some of which have slightly increased in size compared to the CT of 2010. These are not well characterized but may represent complex sebaceous cysts. Clinical correlation is recommended. IMPRESSION: 1. No acute intracranial hemorrhage. 2. Moderate age-related atrophy and chronic microvascular ischemic changes. 3. High attenuating scalp lesions as seen on the CT of 2010, incompletely characterized, possibly complex sebaceous cysts. Correlation with clinical exam recommended. Electronically Signed   By: Elgie Collard M.D.   On: 03/31/2017 00:26    ____________________________________________   PROCEDURES  Procedure(s) performed: None  Procedures  Critical Care performed: No  ____________________________________________   INITIAL IMPRESSION / ASSESSMENT AND PLAN / ED COURSE  Pertinent labs & imaging results that were available during my care of the patient were reviewed by me and considered in my medical decision making (see chart for details).  This is a 76 year old female who comes into the hospital today with vomiting. The patient was eating in a family function. The patient has no other complaints aside from head pain. Since she has some vomiting I Andrey Campanile the patient for CT scan of her head. Check some urine to ensure that the patient doesn't have a UTI. The patient also hasn't urinated since she arrived. I will give her a normal saline bolus of 500 ML's and some Zofran. I will reassess the  patient once I received the results of her catheterized urine.     It appears that the patient has urinary tract infection. She has many bacteria with too numerous to count white blood cells and red blood cells. The patient received some Zofran and was able to eat some applesauce without any vomiting. Also give the patient a dose of ceftriaxone. She'll be discharged home to follow-up with her primary care physician. ____________________________________________   FINAL CLINICAL IMPRESSION(S) / ED DIAGNOSES  Final diagnoses:  Non-intractable vomiting with nausea, unspecified vomiting type  Acute cystitis with hematuria      NEW MEDICATIONS STARTED DURING THIS VISIT:  New Prescriptions   CEPHALEXIN (KEFLEX) 500 MG CAPSULE    Take 1 capsule (500 mg total) by mouth 4 (four) times daily.   ONDANSETRON (ZOFRAN ODT) 4 MG DISINTEGRATING TABLET    Take 1 tablet (4 mg total) by mouth every 8 (eight) hours as needed for nausea or vomiting.     Note:  This document was prepared using Dragon voice recognition software and may include unintentional dictation errors.    Rebecka Apley, MD 03/31/17 [phone]

## 2017-03-30 NOTE — ED Triage Notes (Addendum)
Pt stays at [facility].  Pt has legal guardian, sister with pt trying to figure out who it is.  Sister brought pt for vomiting today.  They were at cookout and she started vomiting.  Has not appeared in pain per sister.  Pt is nonverbal.  Sister faye is legal guardian per group home staff. Pt not vomiting in triage Attempted to call legal guardian but unable to reach at this time, will need to be called in back. [phone]

## 2017-03-31 ENCOUNTER — Observation Stay
Admission: EM | Admit: 2017-03-31 | Discharge: 2017-04-02 | Disposition: A | Discharge status: Skilled Nursing Facility | Payer: Medicare Other | Attending: Internal Medicine | Admitting: Internal Medicine

## 2017-03-31 ENCOUNTER — Emergency Department: Payer: Medicare Other

## 2017-03-31 ENCOUNTER — Encounter: Payer: Self-pay | Admitting: Emergency Medicine

## 2017-03-31 DIAGNOSIS — I700 Atherosclerosis of aorta: Secondary | ICD-10-CM | POA: Insufficient documentation

## 2017-03-31 DIAGNOSIS — B9620 Unspecified Escherichia coli [E. coli] as the cause of diseases classified elsewhere: Secondary | ICD-10-CM | POA: Insufficient documentation

## 2017-03-31 DIAGNOSIS — N3001 Acute cystitis with hematuria: Principal | ICD-10-CM | POA: Insufficient documentation

## 2017-03-31 DIAGNOSIS — Z7982 Long term (current) use of aspirin: Secondary | ICD-10-CM | POA: Insufficient documentation

## 2017-03-31 DIAGNOSIS — I10 Essential (primary) hypertension: Secondary | ICD-10-CM | POA: Diagnosis present

## 2017-03-31 DIAGNOSIS — Z79899 Other long term (current) drug therapy: Secondary | ICD-10-CM | POA: Insufficient documentation

## 2017-03-31 DIAGNOSIS — Z8249 Family history of ischemic heart disease and other diseases of the circulatory system: Secondary | ICD-10-CM | POA: Insufficient documentation

## 2017-03-31 DIAGNOSIS — N390 Urinary tract infection, site not specified: Secondary | ICD-10-CM | POA: Diagnosis present

## 2017-03-31 DIAGNOSIS — G40909 Epilepsy, unspecified, not intractable, without status epilepticus: Secondary | ICD-10-CM

## 2017-03-31 DIAGNOSIS — E876 Hypokalemia: Secondary | ICD-10-CM | POA: Insufficient documentation

## 2017-03-31 DIAGNOSIS — I119 Hypertensive heart disease without heart failure: Secondary | ICD-10-CM | POA: Insufficient documentation

## 2017-03-31 DIAGNOSIS — K224 Dyskinesia of esophagus: Secondary | ICD-10-CM | POA: Insufficient documentation

## 2017-03-31 DIAGNOSIS — Z9049 Acquired absence of other specified parts of digestive tract: Secondary | ICD-10-CM | POA: Insufficient documentation

## 2017-03-31 DIAGNOSIS — Z888 Allergy status to other drugs, medicaments and biological substances status: Secondary | ICD-10-CM | POA: Insufficient documentation

## 2017-03-31 DIAGNOSIS — R112 Nausea with vomiting, unspecified: Secondary | ICD-10-CM | POA: Insufficient documentation

## 2017-03-31 DIAGNOSIS — I252 Old myocardial infarction: Secondary | ICD-10-CM | POA: Insufficient documentation

## 2017-03-31 DIAGNOSIS — R319 Hematuria, unspecified: Secondary | ICD-10-CM

## 2017-03-31 DIAGNOSIS — I2510 Atherosclerotic heart disease of native coronary artery without angina pectoris: Secondary | ICD-10-CM | POA: Diagnosis present

## 2017-03-31 DIAGNOSIS — R1310 Dysphagia, unspecified: Secondary | ICD-10-CM | POA: Insufficient documentation

## 2017-03-31 DIAGNOSIS — K449 Diaphragmatic hernia without obstruction or gangrene: Secondary | ICD-10-CM | POA: Insufficient documentation

## 2017-03-31 DIAGNOSIS — F79 Unspecified intellectual disabilities: Secondary | ICD-10-CM | POA: Insufficient documentation

## 2017-03-31 DIAGNOSIS — K219 Gastro-esophageal reflux disease without esophagitis: Secondary | ICD-10-CM | POA: Diagnosis present

## 2017-03-31 DIAGNOSIS — M1990 Unspecified osteoarthritis, unspecified site: Secondary | ICD-10-CM | POA: Insufficient documentation

## 2017-03-31 LAB — COMPREHENSIVE METABOLIC PANEL
ALBUMIN: 3.5 g/dL (ref 3.5–5.0)
ALK PHOS: 208 U/L — AB (ref 38–126)
ALT: 11 U/L — ABNORMAL LOW (ref 14–54)
ANION GAP: 8 (ref 5–15)
AST: 20 U/L (ref 15–41)
BUN: 19 mg/dL (ref 6–20)
CALCIUM: 9.4 mg/dL (ref 8.9–10.3)
CHLORIDE: 102 mmol/L (ref 101–111)
CO2: 32 mmol/L (ref 22–32)
Creatinine, Ser: 0.63 mg/dL (ref 0.44–1.00)
GFR calc Af Amer: >60 mL/min (ref >60)
GFR calc non Af Amer: >60 mL/min (ref >60)
GLUCOSE: 125 mg/dL — AB (ref 65–99)
Potassium: 3.2 mmol/L — ABNORMAL LOW (ref 3.5–5.1)
SODIUM: 142 mmol/L (ref 135–145)
Total Bilirubin: 0.5 mg/dL (ref 0.3–1.2)
Total Protein: 7.7 g/dL (ref 6.5–8.1)

## 2017-03-31 LAB — CBC
HEMATOCRIT: 36.5 % (ref 35.0–47.0)
HEMOGLOBIN: 11.8 g/dL — AB (ref 12.0–16.0)
MCH: 24.2 pg — AB (ref 26.0–34.0)
MCHC: 32.2 g/dL (ref 32.0–36.0)
MCV: 75 fL — AB (ref 80.0–100.0)
Platelets: 234 10*3/uL (ref 150–440)
RBC: 4.87 MIL/uL (ref 3.80–5.20)
RDW: 20.7 % — ABNORMAL HIGH (ref 11.5–14.5)
WBC: 7.6 10*3/uL (ref 3.6–11.0)

## 2017-03-31 LAB — URINALYSIS, COMPLETE (UACMP) WITH MICROSCOPIC
Bilirubin Urine: NEGATIVE
GLUCOSE, UA: NEGATIVE mg/dL
KETONES UR: NEGATIVE mg/dL
Nitrite: NEGATIVE
PROTEIN: 100 mg/dL — AB
Specific Gravity, Urine: 1.024 (ref 1.005–1.030)
pH: 5 (ref 5.0–8.0)

## 2017-03-31 LAB — LIPASE, BLOOD: Lipase: 24 U/L (ref 11–51)

## 2017-03-31 LAB — TROPONIN I: TROPONIN I: 0.04 ng/mL — AB (ref <0.03)

## 2017-03-31 MED ORDER — ONDANSETRON HCL 4 MG PO TABS: 4.0000 mg | ORAL_TABLET | Freq: Four times a day (QID) | ORAL | Status: DC | PRN

## 2017-03-31 MED ORDER — PHENYTOIN SODIUM EXTENDED 100 MG PO CAPS
100.0000 mg | ORAL_CAPSULE | Freq: Three times a day (TID) | ORAL | Status: DC
  Administered 2017-04-01 – 2017-04-02 (×4): 100 mg via ORAL
  Filled 2017-03-31 (×7): qty 1

## 2017-03-31 MED ORDER — PHENOBARBITAL 20 MG/5ML PO ELIX
60.0000 mg | ORAL_SOLUTION | Freq: Every day | ORAL | Status: DC
  Administered 2017-04-01: 60 mg via ORAL
  Filled 2017-03-31: qty 15

## 2017-03-31 MED ORDER — CEFTRIAXONE SODIUM 2 G IJ SOLR
2.0000 g | INTRAMUSCULAR | Status: DC
  Filled 2017-03-31: qty 2

## 2017-03-31 MED ORDER — METOPROLOL SUCCINATE ER 25 MG PO TB24
25.0000 mg | ORAL_TABLET | Freq: Every day | ORAL | Status: DC
  Administered 2017-04-01 – 2017-04-02 (×2): 25 mg via ORAL
  Filled 2017-03-31 (×2): qty 1

## 2017-03-31 MED ORDER — DEXTROSE 5 % IV SOLN: 1.0000 g | Freq: Once | INTRAVENOUS | Status: DC

## 2017-03-31 MED ORDER — SODIUM CHLORIDE 0.9 % IV SOLN
INTRAVENOUS | Status: AC
  Administered 2017-03-31: via INTRAVENOUS

## 2017-03-31 MED ORDER — PROMETHAZINE HCL 25 MG/ML IJ SOLN
12.5000 mg | Freq: Once | INTRAMUSCULAR | Status: AC
  Administered 2017-03-31: 12.5 mg via INTRAVENOUS
  Filled 2017-03-31: qty 1

## 2017-03-31 MED ORDER — ACETAMINOPHEN 650 MG RE SUPP: 650.0000 mg | Freq: Four times a day (QID) | RECTAL | Status: DC | PRN

## 2017-03-31 MED ORDER — CEFTRIAXONE SODIUM 1 G IJ SOLR
1.0000 g | Freq: Once | INTRAMUSCULAR | Status: AC
  Administered 2017-03-31: 1 g via INTRAVENOUS
  Filled 2017-03-31: qty 10

## 2017-03-31 MED ORDER — TRAMADOL HCL 50 MG PO TABS: 50.0000 mg | ORAL_TABLET | Freq: Four times a day (QID) | ORAL | Status: DC | PRN

## 2017-03-31 MED ORDER — CEFTRIAXONE SODIUM 1 G IJ SOLR
1.0000 g | Freq: Once | INTRAMUSCULAR | Status: AC
  Administered 2017-03-31: 1 g via INTRAMUSCULAR
  Filled 2017-03-31: qty 10

## 2017-03-31 MED ORDER — MORPHINE SULFATE (PF) 2 MG/ML IV SOLN: 2.0000 mg | Freq: Once | INTRAVENOUS | Status: DC

## 2017-03-31 MED ORDER — ENOXAPARIN SODIUM 40 MG/0.4ML ~~LOC~~ SOLN
40.0000 mg | SUBCUTANEOUS | Status: DC
  Administered 2017-04-01: 40 mg via SUBCUTANEOUS
  Filled 2017-03-31: qty 0.4

## 2017-03-31 MED ORDER — ONDANSETRON 4 MG PO TBDP: 4.0000 mg | ORAL_TABLET | Freq: Three times a day (TID) | ORAL | 0 refills | Status: DC | PRN

## 2017-03-31 MED ORDER — SODIUM CHLORIDE 0.9 % IV BOLUS (SEPSIS)
1000.0000 mL | Freq: Once | INTRAVENOUS | Status: AC
  Administered 2017-03-31: 1000 mL via INTRAVENOUS

## 2017-03-31 MED ORDER — LORAZEPAM 0.5 MG PO TABS
0.5000 mg | ORAL_TABLET | Freq: Three times a day (TID) | ORAL | Status: DC
  Administered 2017-04-01 – 2017-04-02 (×3): 0.5 mg via ORAL
  Filled 2017-03-31 (×4): qty 1

## 2017-03-31 MED ORDER — POTASSIUM CHLORIDE CRYS ER 20 MEQ PO TBCR
40.0000 meq | EXTENDED_RELEASE_TABLET | Freq: Once | ORAL | Status: AC
  Administered 2017-03-31: 40 meq via ORAL
  Filled 2017-03-31: qty 2

## 2017-03-31 MED ORDER — PANTOPRAZOLE SODIUM 40 MG PO TBEC
40.0000 mg | DELAYED_RELEASE_TABLET | Freq: Every day | ORAL | Status: DC
  Administered 2017-04-01 – 2017-04-02 (×2): 40 mg via ORAL
  Filled 2017-03-31 (×2): qty 1

## 2017-03-31 MED ORDER — IOPAMIDOL (ISOVUE-300) INJECTION 61%: 15.0000 mL | INTRAVENOUS | Status: DC

## 2017-03-31 MED ORDER — ONDANSETRON HCL 4 MG/2ML IJ SOLN
4.0000 mg | Freq: Once | INTRAMUSCULAR | Status: AC
  Administered 2017-03-31: 4 mg via INTRAVENOUS
  Filled 2017-03-31: qty 2

## 2017-03-31 MED ORDER — CEPHALEXIN 500 MG PO CAPS: 500.0000 mg | ORAL_CAPSULE | Freq: Four times a day (QID) | ORAL | 0 refills | Status: DC

## 2017-03-31 MED ORDER — ASPIRIN EC 81 MG PO TBEC
81.0000 mg | DELAYED_RELEASE_TABLET | Freq: Every day | ORAL | Status: DC
  Administered 2017-04-01 – 2017-04-02 (×2): 81 mg via ORAL
  Filled 2017-03-31 (×2): qty 1

## 2017-03-31 MED ORDER — ONDANSETRON HCL 4 MG/2ML IJ SOLN: 4.0000 mg | Freq: Four times a day (QID) | INTRAMUSCULAR | Status: DC | PRN

## 2017-03-31 MED ORDER — GABAPENTIN 300 MG PO CAPS
600.0000 mg | ORAL_CAPSULE | Freq: Four times a day (QID) | ORAL | Status: DC
  Administered 2017-04-01 – 2017-04-02 (×6): 600 mg via ORAL
  Filled 2017-03-31 (×7): qty 2

## 2017-03-31 MED ORDER — IOPAMIDOL (ISOVUE-300) INJECTION 61%
80.0000 mL | Freq: Once | INTRAVENOUS | Status: AC | PRN
  Administered 2017-03-31: 80 mL via INTRAVENOUS

## 2017-03-31 MED ORDER — ISOSORBIDE MONONITRATE ER 30 MG PO TB24
60.0000 mg | ORAL_TABLET | Freq: Every day | ORAL | Status: DC
  Administered 2017-04-01 – 2017-04-02 (×2): 60 mg via ORAL
  Filled 2017-03-31 (×2): qty 2

## 2017-03-31 MED ORDER — ACETAMINOPHEN 325 MG PO TABS: 650.0000 mg | ORAL_TABLET | Freq: Four times a day (QID) | ORAL | Status: DC | PRN

## 2017-03-31 NOTE — ED Triage Notes (Signed)
Staff from [facility] brought pt back for vomiting today.  Pt nonverbal at baseline.  Tried nausea pill from prescription but report didn't help.  This RN triaged pt yesterday and pt presents same as yesterday. No vomiting in triage.

## 2017-03-31 NOTE — ED Provider Notes (Signed)
[facility] Emergency Department Provider Note ____________________________________________   I have reviewed the triage vital signs and the triage nursing note.  HISTORY  Chief Complaint Emesis   Historian Patient  HPI Hannah Vaughan is a 76 y.o. female with a history of mental retardation, lives at a group home, presents for reevaluation of ongoing nausea and vomiting since yesterday. She was evaluated in the ED yesterday and diagnosed with urinary tract infection. She has not picked up the antibody for today. No reported fevers. She has had ongoing nausea and vomiting, numerous episodes, nonbloody and nonbilious.  Although she has difficulty communicating, she did seem to indicate that her mouth was hurting her.  No diarrhea. No indicators of abdominal pain per se.  Symptoms are moderate. Nothing makes it worse or better.    Past Medical History:  Diagnosis Date  . CAD (coronary artery disease)   . GERD (gastroesophageal reflux disease)   . History of hiatal hernia   . HTN (hypertension)   . Mental retardation   . Osteoarthritis   . Pneumococcus infection   . Seasonal allergies   . Seizure (HCC)   . Seizure [facility](HCC)     Patient Active Problem List   Diagnosis Date Noted  . NSTEMI (non-ST elevated myocardial infarction) (HCC) 11/26/2016  . Acute respiratory failure with hypoxemia (HCC) 05/06/2015  . HTN (hypertension) 05/06/2015  . Seizure disorder (HCC) 05/06/2015  . CAD (coronary artery disease) 05/06/2015  . Osteoarthritis 05/06/2015  . GERD (gastroesophageal reflux disease) 05/06/2015  . HCAP (healthcare-associated pneumonia) 05/06/2015  . Abnormal ECG 06/24/2014  . 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 Dependent edema 06/06/2014    Past Surgical History:  Procedure Laterality Date  . CHOLECYSTECTOMY      Prior to Admission medications   Medication Sig Start Date End Date Taking? Authorizing Provider  acetaminophen (TYLENOL) 325 MG tablet  Take 1 tablet by mouth every 6 (six) hours.   Yes [provider]  Aluminum & Magnesium Hydroxide (MALDROXAL ANTACID PO) Take by mouth as needed.   Yes [provider]  aspirin EC 81 MG tablet Take 1 tablet by mouth daily. 11/11/14  Yes [provider]  calcium-vitamin D (CALCIUM 500/D) 500-200 MG-UNIT per tablet Take 1 tablet by mouth 2 (two) times daily.    Yes [provider]  carbamide peroxide (DEBROX) 6.5 % otic solution Place 5 drops into both ears every 14 (fourteen) days.    Yes [provider]  cetirizine (ZYRTEC) 10 MG tablet Take 1 tablet by mouth daily as needed. 03/25/14  Yes [provider]  cyanocobalamin (,VITAMIN B-12,) 1000 MCG/ML injection Inject 1 mL into the muscle every 30 (thirty) days.   Yes [provider]  docusate sodium (STOOL SOFTENER) 100 MG capsule Take 1 capsule by mouth 2 (two) times daily.   Yes [provider]  gabapentin (NEURONTIN) 600 MG tablet Take 1 tablet by mouth 4 (four) times daily.  06/11/14  Yes [provider]  guaiFENesin (ROBITUSSIN) 100 MG/5ML SOLN Take 5 mLs by mouth every 4 (four) hours as needed for cough or to loosen phlegm.   Yes [provider]  hydrochlorothiazide (HYDRODIURIL) 25 MG tablet Take 1 tablet by mouth daily.   Yes [provider]  hydrocortisone (ANUSOL-HC) 25 MG suppository Place 25 mg rectally 2 (two) times daily as needed for hemorrhoids or itching.   Yes [provider]  hydrocortisone-pramoxine (PROCTOFOAM-HC) rectal foam Place 1 applicator rectally 2 (two) times daily.  Yes [provider]  iron polysaccharides (NIFEREX) 150 MG capsule Take 150 mg by mouth daily. 12/12/16 12/12/17 Yes [provider]  isosorbide mononitrate (IMDUR) 60 MG 24 hr tablet Take 1 tablet by mouth daily. 02/11/14  Yes [provider]  loperamide (IMODIUM) 2 MG capsule Take 2 mg by mouth as needed for diarrhea or loose  stools.   Yes [provider]  LORazepam (ATIVAN) 0.5 MG tablet Take 0.5 mg by mouth every 8 (eight) hours.   Yes [provider]  magnesium hydroxide (MILK OF MAGNESIA) 400 MG/5ML suspension Take 5 mLs by mouth as needed.   Yes [provider]  metoprolol succinate (TOPROL-XL) 25 MG 24 hr tablet Take 1 tablet by mouth daily.   Yes [provider]  miconazole (MICOTIN) 2 % powder Apply 1 application topically 2 (two) times daily.   Yes [provider]  mupirocin ointment (BACTROBAN) 2 % Place 1 application into the nose 3 (three) times daily.   Yes [provider]  nitroGLYCERIN (NITROSTAT) 0.4 MG SL tablet Place 1 tablet under the tongue as needed. 04/06/15  Yes [provider]  nystatin (NYSTATIN) powder Apply 1 g topically as needed.   Yes [provider]  omeprazole (PRILOSEC) 20 MG capsule Take 1 capsule by mouth daily. 01/06/15  Yes [provider]  ondansetron (ZOFRAN ODT) 4 MG disintegrating tablet Take 1 tablet (4 mg total) by mouth every 8 (eight) hours as needed for nausea or vomiting. 03/31/17  Yes Rebecka Apley, MD  PHENObarbital 20 MG/5ML elixir Take 15 mLs by mouth at bedtime.   Yes [provider]  phenytoin (DILANTIN) 100 MG ER capsule Take 100 mg by mouth 3 (three) times daily.   Yes [provider]  Skin Protectants, Misc. (EUCERIN) cream Apply 1 application topically 2 (two) times daily.    Yes [provider]  terbinafine (LAMISIL) 1 % cream Apply 1 application topically 2 (two) times daily.   Yes [provider]  tolnaftate (TINACTIN) 1 % spray Apply 1 application topically 2 (two) times daily.   Yes [provider]  traMADol (ULTRAM) 50 MG tablet Take 1 tablet (50 mg total) by mouth every 6 (six) hours as needed. 05/09/15  Yes Enid Baas, MD  triamcinolone cream (KENALOG) 0.1 % Apply 1 application topically 2 (two) times daily.    Yes [provider]  cephALEXin (KEFLEX) 500 MG capsule Take 1 capsule (500 mg total) by mouth 4 (four) times daily. 03/31/17 04/10/17  Rebecka Apley, MD    Allergies  Allergen Reactions  . Depakote [Valproic Acid]   . Divalproex Sodium Other (See Comments)    Family History  Problem Relation Age of Onset  . CAD Mother   . Congestive Heart Failure Mother   . CVA Father   . Heart attack Brother   . Hypertension Sister   . Diabetes Sister     Social History Social History  Substance Use Topics  . Smoking status: Never Smoker  . Smokeless tobacco: Never Used  . Alcohol use No    Review of Systems Per staff/caregivers Constitutional: Negative for fever. Eyes: Negative for visual changes. ENT: Questionable sore throat. Cardiovascular: Negative for chest pain. Respiratory: Negative for cough. Gastrointestinal: Negative for diarrhea. Genitourinary: Negative for bloody urine. Musculoskeletal: Negative for extremity pain.. Skin: Negative for rash. Neurological: Negative for headache.  ____________________________________________   PHYSICAL EXAM:  VITAL SIGNS: ED Triage Vitals  Enc Vitals Group  BP 03/31/17 1256 123/74     Pulse Rate 03/31/17 1256 87     Resp 03/31/17 1256 16     Temp 03/31/17 1256 98 F (36.7 C)     Temp Source 03/31/17 1256 Oral     SpO2 03/31/17 1256 96 %     Weight 03/31/17 1252 119 lb (54 kg)     Height 03/31/17 1252 4\' 11"  (1.499 m)     Head Circumference --      Peak Flow --      Pain Score --      Pain Loc --      Pain Edu? --      Excl. in GC? --      Constitutional: Alert and Interactive. Not much verbal interaction she is able to follow most commands.. Well appearing overall and in no distress. HEENT   Head: Normocephalic and atraumatic.      Eyes: Conjunctivae are normal. Pupils equal and round.       Ears:         Nose: No congestion/rhinnorhea.   Mouth/Throat: Mucous membranes are moist.  She does protrude her tongue  frequently. Upper pharynx was visualized, nonerythematous.   Neck: No stridor. Cardiovascular/Chest: Normal rate, regular rhythm.  No murmurs, rubs, or gallops. Respiratory: Normal respiratory effort without tachypnea nor retractions. Breath sounds are clear and equal bilaterally. No wheezes/rales/rhonchi. Gastrointestinal: Soft. No distention, no guarding, no rebound. Nontender.   Genitourinary/rectal:Deferred Musculoskeletal: Nontender with normal range of motion in all extremities. No joint effusions.  No lower extremity tenderness.  No edema. Neurologic: No facial droop. Patient is moving 4 extremities. Skin:  Skin is warm, dry and intact. No rash noted.    ____________________________________________  LABS (pertinent positives/negatives)  Labs Reviewed  COMPREHENSIVE METABOLIC PANEL - Abnormal; Notable for the following:       Result Value   Potassium 3.2 (*)    Glucose, Bld 125 (*)    ALT 11 (*)    Alkaline Phosphatase 208 (*)    All other components within normal limits  CBC - Abnormal; Notable for the following:    Hemoglobin 11.8 (*)    MCV 75.0 (*)    MCH 24.2 (*)    RDW 20.7 (*)    All other components within normal limits  TROPONIN I - Abnormal; Notable for the following:    Troponin I 0.04 (*)    All other components within normal limits  LIPASE, BLOOD    ____________________________________________    EKG I, Governor Rooks, MD, the attending physician have personally viewed and interpreted all ECGs.  80 bpm. wavy/interference underlying baseline. Appears to be normal sinus rhythm. Undetermined access. Left bundle branch block. Nonspecific T wave. 1 to, aVL and aVR are really impossible to interpret based on this EKG. ____________________________________________  RADIOLOGY All Xrays were viewed by me. Imaging interpreted by Radiologist.  CT abdomen and pelvis with contrast:  IMPRESSION: 1. Irregular wall thickening and mucosal hyperenhancement  involving the posterior bladder wall bilaterally with associated perivesical fat haziness, nonspecific, most likely representing an unusual distribution of acute cystitis. Correlation with urinalysis advised. Cystoscopic correlation could be obtained as clinically warranted. Tiny gas bubbles in the nondependent bladder are presumably related to instrumentation, correlate with clinical history . 2. Small hiatal hernia . Patulous fluid-filled lower thoracic esophagus, indicating esophageal dysmotility and/ or gastroesophageal reflux. Associated circumferential lower thoracic esophageal wall thickening, nonspecific, most commonly due to reflux esophagitis. Endoscopic correlation could be obtained as clinically warranted .  3. Mild patchy tree-in-bud opacities at the left lung base, indicating a nonspecific infectious or inflammatory bronchiolitis, with the differential including aspiration . 4. Nondisplaced lateral left tenth rib fracture, which may be acute. 5. Aortic Atherosclerosis (ICD10-I70.0). Coronary atherosclerosis. Cardiomegaly. __________________________________________  PROCEDURES  Procedure(s) performed: None  Critical Care performed: None  ____________________________________________   ED COURSE / ASSESSMENT AND PLAN  Pertinent labs & imaging results that were available during my care of the patient were reviewed by me and considered in my medical decision making (see chart for details).   Tomson is here for the second time within 24 hours for continued vomiting. Was diagnosed with uti, but has not filled rx.  She has stable vital signs. She does not look clinically dehydrated, but given the symptoms I'm going to give her a liter normal saline. Given ongoing nausea and vomiting I did recheck laboratory studies and she does have hypokalemia. After Zofran here no additional episodes of vomiting. I discussed with caregivers obtaining CT of abdomen and pelvis to rule out  obstruction as a source of her vomiting.  I don't think symptoms are related to ACS. Troponin minimally elevated at 0.04, this is actually the lowest that it's been in review of prior minimally elevated troponins.  CT scan showed no evidence of obstruction or other acute intra-abdominal abnormality. Comment regarding pulmonary findings, patient is not having any pulmonary findings clinically. I will refer her to outpatient GI patient has persistent symptoms.  CT scan shows possible acute rib fracture, patient does not have pain clinically.  Cordelia Pen, group home nurse here as well as other staffmembers.  If passes po challenge will dc home.  Received one dose IM for UTI obtained at ED stay overnight.  Patient after eating applesauce here did throw back up. She is going to need hospital observation admission for intractable nausea and vomiting in the setting of UTI.   CONSULTATIONS:   Hospitalist for admission   Patient / Family / Caregiver informed of clinical course, medical decision-making process, and agree with plan.  ___________________________________________   FINAL CLINICAL IMPRESSION(S) / ED DIAGNOSES   Final diagnoses:  Urinary tract infection with hematuria, site unspecified  Intractable vomiting with nausea, unspecified vomiting type              Note: This dictation was prepared with Dragon dictation. Any transcriptional errors that result from this process are unintentional    Governor Rooks, MD 03/31/17 1956

## 2017-03-31 NOTE — H&P (Signed)
[facility] - [place] at [facility]   PATIENT NAME: Hannah Vaughan    MR#:  [id number]  DATE OF BIRTH:  March 06, 1941  DATE OF ADMISSION:  03/31/2017  PRIMARY CARE PHYSICIAN: Lauro Regulus, MD   REQUESTING/REFERRING PHYSICIAN: Shaune Pollack, MD  CHIEF COMPLAINT:   Chief Complaint  Patient presents with  . Emesis    HISTORY OF PRESENT ILLNESS:  Hannah Vaughan  is a 76 y.o. female who presents with Several episodes of vomiting. Patient has a history of mental retardation and is unable to contribute to her history. Care giver is with her in the ED and provides the history. ED evaluation tonight shows UTI. Hospitalists were called for mission.  PAST MEDICAL HISTORY:   Past Medical History:  Diagnosis Date  . CAD (coronary artery disease)   . GERD (gastroesophageal reflux disease)   . History of hiatal hernia   . HTN (hypertension)   . Mental retardation   . Osteoarthritis   . Pneumococcus infection   . Seasonal allergies   . Seizure (HCC)   . Seizure (HCC)     PAST SURGICAL HISTORY:   Past Surgical History:  Procedure Laterality Date  . CHOLECYSTECTOMY      SOCIAL HISTORY:   Social History  Substance Use Topics  . Smoking status: Never Smoker  . Smokeless tobacco: Never Used  . Alcohol use No    FAMILY HISTORY:   Family History  Problem Relation Age of Onset  . CAD Mother   . Congestive Heart Failure Mother   . CVA Father   . Heart attack Brother   . Hypertension Sister   . Diabetes Sister     DRUG ALLERGIES:   Allergies  Allergen Reactions  . Depakote [Valproic Acid]   . Divalproex Sodium Other (See Comments)    MEDICATIONS AT HOME:   Prior to Admission medications   Medication Sig Start Date End Date Taking? Authorizing Provider  acetaminophen (TYLENOL) 325 MG tablet Take 1 tablet by mouth every 6 (six) hours.   Yes [provider]  Aluminum & Magnesium Hydroxide (MALDROXAL ANTACID PO) Take by mouth as needed.   Yes  [provider]  aspirin EC 81 MG tablet Take 1 tablet by mouth daily. 11/11/14  Yes [provider]  calcium-vitamin D (CALCIUM 500/D) 500-200 MG-UNIT per tablet Take 1 tablet by mouth 2 (two) times daily.    Yes [provider]  carbamide peroxide (DEBROX) 6.5 % otic solution Place 5 drops into both ears every 14 (fourteen) days.    Yes [provider]  cetirizine (ZYRTEC) 10 MG tablet Take 1 tablet by mouth daily as needed. 03/25/14  Yes [provider]  cyanocobalamin (,VITAMIN B-12,) 1000 MCG/ML injection Inject 1 mL into the muscle every 30 (thirty) days.   Yes [provider]  docusate sodium (STOOL SOFTENER) 100 MG capsule Take 1 capsule by mouth 2 (two) times daily.   Yes [provider]  gabapentin (NEURONTIN) 600 MG tablet Take 1 tablet by mouth 4 (four) times daily.  06/11/14  Yes [provider]  guaiFENesin (ROBITUSSIN) 100 MG/5ML SOLN Take 5 mLs by mouth every 4 (four) hours as needed for cough or to loosen phlegm.   Yes [provider]  hydrochlorothiazide (HYDRODIURIL) 25 MG tablet Take 1 tablet by mouth daily.   Yes [provider]  hydrocortisone (ANUSOL-HC) 25 MG suppository Place 25 mg rectally 2 (two) times daily as needed for hemorrhoids or itching.   Yes  [provider]  hydrocortisone-pramoxine [facility](PROCTOFOAM-HC) rectal foam Place 1 applicator rectally 2 (two) times daily.   Yes [provider]  iron polysaccharides (NIFEREX) 150 MG capsule Take 150 mg by mouth daily. 12/12/16 12/12/17 Yes [provider]  isosorbide mononitrate (IMDUR) 60 MG 24 hr tablet Take 1 tablet by mouth daily. 02/11/14  Yes [provider]  loperamide (IMODIUM) 2 MG capsule Take 2 mg by mouth as needed for diarrhea or loose stools.   Yes [provider]  LORazepam (ATIVAN) 0.5 MG tablet Take 0.5 mg by mouth every 8 (eight) hours.   Yes [provider]  magnesium  hydroxide (MILK OF MAGNESIA) 400 MG/5ML suspension Take 5 mLs by mouth as needed.   Yes [provider]  metoprolol succinate (TOPROL-XL) 25 MG 24 hr tablet Take 1 tablet by mouth daily.   Yes [provider]  miconazole (MICOTIN) 2 % powder Apply 1 application topically 2 (two) times daily.   Yes [provider]  mupirocin ointment (BACTROBAN) 2 % Place 1 application into the nose 3 (three) times daily.   Yes [provider]  nitroGLYCERIN (NITROSTAT) 0.4 MG SL tablet Place 1 tablet under the tongue as needed. 04/06/15  Yes [provider]  nystatin (NYSTATIN) powder Apply 1 g topically as needed.   Yes [provider]  omeprazole (PRILOSEC) 20 MG capsule Take 1 capsule by mouth daily. 01/06/15  Yes [provider]  ondansetron (ZOFRAN ODT) 4 MG disintegrating tablet Take 1 tablet (4 mg total) by mouth every 8 (eight) hours as needed for nausea or vomiting. 03/31/17  Yes Rebecka ApleyWebster, Allison P, MD  PHENObarbital 20 MG/5ML elixir Take 15 mLs by mouth at bedtime.   Yes [provider]  phenytoin (DILANTIN) 100 MG ER capsule Take 100 mg by mouth 3 (three) times daily.   Yes [provider]  Skin Protectants, Misc. (EUCERIN) cream Apply 1 application topically 2 (two) times daily.    Yes [provider]  terbinafine (LAMISIL) 1 % cream Apply 1 application topically 2 (two) times daily.   Yes [provider]  tolnaftate (TINACTIN) 1 % spray Apply 1 application topically 2 (two) times daily.   Yes [provider]  traMADol (ULTRAM) 50 MG tablet Take 1 tablet (50 mg total) by mouth every 6 (six) hours as needed. 05/09/15  Yes Enid BaasKalisetti, Radhika, MD  triamcinolone cream (KENALOG) 0.1 % Apply 1 application topically 2 (two) times daily.    Yes [provider]  cephALEXin (KEFLEX) 500 MG capsule Take 1 capsule (500 mg total) by mouth 4 (four) times daily. 03/31/17 04/10/17  Rebecka ApleyWebster, Allison P, MD     REVIEW OF SYSTEMS:  Review of Systems  Unable to perform ROS: Medical condition     VITAL SIGNS:   Vitals:   03/31/17 1256 03/31/17 1520 03/31/17 1817 03/31/17 1900  BP: 123/74 115/76 105/78 126/81  Pulse: 87 73 83 94  Resp: 16     Temp: 98 F (36.7 C)     TempSrc: Oral     SpO2: 96% 99% 95% 95%  Weight:      Height:       Wt Readings from Last 3 Encounters:  03/31/17 54 kg (119 lb)  03/30/17 54 kg (119 lb)  01/24/17 49 kg (108 lb)    PHYSICAL EXAMINATION:  Physical Exam  Vitals reviewed. Constitutional: She appears well-developed and well-nourished. No distress.  HENT:  Head: Normocephalic and atraumatic.  Dry mucous membranes  Eyes:  Pupils are equal, round, and reactive to light. Conjunctivae and EOM are normal. No scleral icterus.  Neck: Normal range of motion. Neck supple. No JVD present. No thyromegaly present.  Cardiovascular: Normal rate, regular rhythm and intact distal pulses.  Exam reveals no gallop and no friction rub.   No murmur heard. Respiratory: Effort normal and breath sounds normal. No respiratory distress. She has no wheezes. She has no rales.  GI: Soft. Bowel sounds are normal. She exhibits no distension. There is tenderness (Suprapubic).  Musculoskeletal: Normal range of motion. She exhibits no edema.  No arthritis, no gout  Lymphadenopathy:    She has no cervical adenopathy.  Neurological: She is alert.  Unable to fully assess due to condition  Skin: Skin is warm and dry. No rash noted. No erythema.  Psychiatric:  Unable to assess due to condition    LABORATORY PANEL:   CBC  Recent Labs Lab 03/31/17 1313  WBC 7.6  HGB 11.8*  HCT 36.5  PLT 234   ------------------------------------------------------------------------------------------------------------------  Chemistries   Recent Labs Lab 03/31/17 1313  NA 142  K 3.2*  CL 102  CO2 32  GLUCOSE 125*  BUN 19  CREATININE 0.63  CALCIUM 9.4  AST 20  ALT 11*  ALKPHOS  208*  BILITOT 0.5   ------------------------------------------------------------------------------------------------------------------  Cardiac Enzymes  Recent Labs Lab 03/31/17 1313  TROPONINI 0.04*   ------------------------------------------------------------------------------------------------------------------  RADIOLOGY:  Ct Head Wo Contrast  Result Date: 03/31/2017 CLINICAL DATA:  76 year old female with headache and vomiting. EXAM: CT HEAD WITHOUT CONTRAST TECHNIQUE: Contiguous axial images were obtained from the base of the skull through the vertex without intravenous contrast. COMPARISON:  Head CT dated 01/17/2009 FINDINGS: Brain: There is moderate age-related atrophy and chronic microvascular ischemic changes. There is no acute intracranial hemorrhage. No mass effect or midline shift noted. No extra-axial fluid collection. Vascular: No hyperdense vessel or unexpected calcification. Skull: Normal. Negative for fracture or focal lesion. There is hyperostosis frontalis. Sinuses/Orbits: No acute finding. Other: Multiple exophytic high attenuating lesion in the scalp again noted some of which have slightly increased in size compared to the CT of 2010. These are not well characterized but may represent complex sebaceous cysts. Clinical correlation is recommended. IMPRESSION: 1. No acute intracranial hemorrhage. 2. Moderate age-related atrophy and chronic microvascular ischemic changes. 3. High attenuating scalp lesions as seen on the CT of 2010, incompletely characterized, possibly complex sebaceous cysts. Correlation with clinical exam recommended. Electronically Signed   By: Elgie Collard M.D.   On: 03/31/2017 00:26   Ct Abdomen Pelvis W Contrast  Result Date: 03/31/2017 CLINICAL DATA:  Vomiting for 2 days.  Prior cholecystectomy. EXAM: CT ABDOMEN AND PELVIS WITH CONTRAST TECHNIQUE: Multidetector CT imaging of the abdomen and pelvis was performed using the standard protocol following  bolus administration of intravenous contrast. CONTRAST:  80mL ISOVUE-300 IOPAMIDOL (ISOVUE-300) INJECTION 61% COMPARISON:  None. FINDINGS: Lower chest: Mild patchy tree-in-bud opacities with small parenchymal band in the left lower lobe. Patulous fluid-filled lower thoracic esophagus with associated circumferential esophageal wall thickening. Cardiomegaly. Coronary atherosclerosis. Hepatobiliary: Normal liver size. No liver mass. Cholecystectomy. Bile ducts are within normal post cholecystectomy limits with common bile duct diameter 5 mm. Pancreas: Normal, with no mass or duct dilation. Spleen: Normal size. No mass. Adrenals/Urinary Tract: Normal adrenals. Subcentimeter hypodense renal cortical lesion in the anterior interpolar left kidney is too small to characterize and requires no further follow-up. Otherwise normal kidneys, with no hydronephrosis. There is irregular wall thickening with mucosal hyperenhancement involving the  posterior bladder wall bilaterally with associated haziness of the perivesical fat. A few scattered gas bubbles are noted in the nondependent bladder. Stomach/Bowel: Small hiatal hernia. Otherwise grossly normal stomach. Normal caliber small bowel with no small bowel wall thickening. Appendix not discretely visualized. No pericecal inflammatory changes. Normal large bowel with no diverticulosis, large bowel wall thickening or pericolonic fat stranding. Vascular/Lymphatic: Atherosclerotic nonaneurysmal abdominal aorta. Patent portal, splenic, hepatic and renal veins. No pathologically enlarged lymph nodes in the abdomen or pelvis. Reproductive: Status post hysterectomy, with no abnormal findings at the vaginal cuff. No adnexal mass. Other: No pneumoperitoneum, ascites or focal fluid collection. Musculoskeletal: No aggressive appearing focal osseous lesions. Sclerotic anterior left acetabular lesion is nonspecific and probably a benign bone island. Marked thoracolumbar spondylosis. 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 Lateral left tenth rib nondisplaced fracture, which may be acute. IMPRESSION: 1. Irregular wall thickening and mucosal hyperenhancement involving the posterior bladder wall bilaterally with associated perivesical fat haziness, nonspecific, most likely representing an unusual distribution of acute cystitis. Correlation with urinalysis advised. Cystoscopic correlation could be obtained as clinically warranted. Tiny gas bubbles in the nondependent bladder are presumably related to instrumentation, correlate with clinical history . 2. Small hiatal hernia . Patulous fluid-filled lower thoracic esophagus, indicating esophageal dysmotility and/ or gastroesophageal reflux. Associated circumferential lower thoracic esophageal wall thickening, nonspecific, most commonly due to reflux esophagitis. Endoscopic correlation could be obtained as clinically warranted . 3. Mild patchy tree-in-bud opacities at the left lung base, indicating a nonspecific infectious or inflammatory bronchiolitis, with the differential including aspiration . 4. Nondisplaced lateral left tenth rib fracture, which may be acute. 5. Aortic Atherosclerosis (ICD10-I70.0). Coronary atherosclerosis. Cardiomegaly. Electronically Signed   By: Delbert Phenix M.D.   On: 03/31/2017 15:53    EKG:   Orders placed or performed during the hospital encounter of 03/31/17  . ED EKG  . ED EKG  . EKG 12-Lead  . EKG 12-Lead  . EKG 12-Lead  . EKG 12-Lead    IMPRESSION AND PLAN:  Principal Problem:   UTI (urinary tract infection) - IV antibiotics and place, urine culture sent Active Problems:   HTN (hypertension) - continue home meds   CAD (coronary artery disease) - home dose medications   Seizure disorder (HCC) - home dose antiepileptic   GERD (gastroesophageal reflux disease) - home dose PPI  All the records are reviewed and case discussed with ED  provider. Management plans discussed with the patient and/or family.  DVT PROPHYLAXIS: SubQ lovenox  GI PROPHYLAXIS: PPI  ADMISSION STATUS: Observation  CODE STATUS: Full Code Status History    Date Active Date Inactive Code Status Order ID Comments User Context   11/26/2016  3:02 PM 11/28/2016  6:18 PM Full Code [id number]  Milagros Loll, MD ED   05/06/2015 11:52 PM 05/09/2015  8:04 PM Full Code [id number]  Oralia Manis, MD Inpatient      TOTAL TIME TAKING CARE OF THIS PATIENT: 40 minutes.   Mikella Linsley FIELDING 03/31/2017, 8:58 PM  [employer]  [phone]  CC: Primary care physician; Lauro Regulus, MD  Note:  This document was prepared using Dragon voice recognition software and may include unintentional dictation errors.

## 2017-03-31 NOTE — ED Notes (Signed)
Patient transported to CT 

## 2017-03-31 NOTE — Progress Notes (Signed)
Pharmacy Antibiotic Note  Hannah GroutDoris E Regan is a 76 y.o. female admitted on 03/31/2017 with UTI.  Pharmacy has been consulted for ceftriaxone dosing.  Plan: Ceftriaxone 2 grams q 24 hours ordered.  Height: 4\' 11"  (149.9 cm) Weight: 119 lb (54 kg) IBW/kg (Calculated) : 43.2  Temp (24hrs), Avg:98.2 F (36.8 C), Min:97.8 F (36.6 C), Max:98.8 F (37.1 C)   Recent Labs Lab 03/30/17 1814 03/31/17 1313  WBC 7.3 7.6  CREATININE 0.67 0.63    Estimated Creatinine Clearance: 46.3 mL/min (by C-G formula based on SCr of 0.63 mg/dL).    Allergies  Allergen Reactions  . Depakote [Valproic Acid]   . Divalproex Sodium Other (See Comments)    Antimicrobials this admission: Ceftriaxone 7/15  >>    >>   Dose adjustments this admission:   Microbiology results: 7/15 UCx: pending       7/15 UA: LE(+) NO2 (-) WBC TNTC Thank you for allowing pharmacy to be a part of this patient's care.  Luretha Eberly S 03/31/2017 11:23 PM

## 2017-03-31 NOTE — ED Triage Notes (Addendum)
FIRST NURSE NOTE-PT here for vomiting. Seen yesterday and discharged early this AM. Staff from [facility] report pt is still vomiting despite pill they gave her. Pt has legal guardian

## 2017-03-31 NOTE — ED Notes (Signed)
Patient placed on 2L nasal cannula due to desaturating oxygen levels.  Patient tolerating oxygen well at this time.

## 2017-03-31 NOTE — ED Notes (Signed)
Pt given apple sauce for PO challenge; pt unable to tolerate, becoming nauseous and vomiting before finishing apple sauce.  MD notified.

## 2017-03-31 NOTE — ED Notes (Signed)
Patient transported to X-ray 

## 2017-03-31 NOTE — Discharge Instructions (Signed)
Please follow-up with your primary care physician. Please take your antibiotics as they have been prescribed. Please return if you have any fevers any abdominal pain or any worsening vomiting.

## 2017-03-31 NOTE — ED Notes (Signed)

## 2017-04-01 DIAGNOSIS — N3001 Acute cystitis with hematuria: Secondary | ICD-10-CM | POA: Diagnosis not present

## 2017-04-01 LAB — CBC
HEMATOCRIT: 31.6 % — AB (ref 35.0–47.0)
Hemoglobin: 10.1 g/dL — ABNORMAL LOW (ref 12.0–16.0)
MCH: 23.8 pg — ABNORMAL LOW (ref 26.0–34.0)
MCHC: 32 g/dL (ref 32.0–36.0)
MCV: 74.2 fL — AB (ref 80.0–100.0)
Platelets: 192 10*3/uL (ref 150–440)
RBC: 4.26 MIL/uL (ref 3.80–5.20)
RDW: 20.5 % — AB (ref 11.5–14.5)
WBC: 9.6 10*3/uL (ref 3.6–11.0)

## 2017-04-01 LAB — BASIC METABOLIC PANEL
Anion gap: 7 (ref 5–15)
BUN: 16 mg/dL (ref 6–20)
CO2: 30 mmol/L (ref 22–32)
CREATININE: 0.67 mg/dL (ref 0.44–1.00)
Calcium: 8.8 mg/dL — ABNORMAL LOW (ref 8.9–10.3)
Chloride: 107 mmol/L (ref 101–111)
GFR calc Af Amer: >60 mL/min (ref >60)
GFR calc non Af Amer: >60 mL/min (ref >60)
GLUCOSE: 90 mg/dL (ref 65–99)
POTASSIUM: 3.6 mmol/L (ref 3.5–5.1)
SODIUM: 144 mmol/L (ref 135–145)

## 2017-04-01 LAB — MRSA PCR SCREENING: MRSA by PCR: NEGATIVE

## 2017-04-01 MED ORDER — FLUCONAZOLE 100 MG PO TABS
100.0000 mg | ORAL_TABLET | Freq: Every day | ORAL | Status: DC
  Administered 2017-04-01 – 2017-04-02 (×2): 100 mg via ORAL
  Filled 2017-04-01 (×2): qty 1

## 2017-04-01 MED ORDER — DEXTROSE 5 % IV SOLN
1.0000 g | INTRAVENOUS | Status: DC
  Administered 2017-04-01: 1 g via INTRAVENOUS
  Filled 2017-04-01 (×2): qty 10

## 2017-04-01 NOTE — Care Management Note (Signed)
Case Management Note  Patient Details  Name: Oneal GroutDoris E Pinkhasov MRN: [id number] Date of Birth: July 26, 1941  Subjective/Objective: [facility] as an observation patient. DX: UTI. Marland Kitchen. Observation notice give to caregiver at bedside Orvan Falconer(Fanny Macadoo).                    Action/Plan: [facility].   Expected Discharge Date:                  Expected Discharge Plan:  Group Home  In-House Referral:  Clinical Social Work  Discharge planning Services  CM Consult  Post Acute Care Choice:    Choice offered to:     DME Arranged:    DME Agency:     HH Arranged:    HH Agency:     Status of Service:  In process, will continue to follow  If discussed at Long Length of Stay Meetings, dates discussed:    Additional Comments:  Marily MemosLisa M Breiana Stratmann, RN 04/01/2017, 9:33 AM

## 2017-04-01 NOTE — Progress Notes (Signed)
[employer] [facility] [place] [facility]   PATIENT NAME: Hannah Vaughan    MR#:  [id number]  DATE OF BIRTH:  11/12/1940  SUBJECTIVE:  CHIEF COMPLAINT:   Chief Complaint  Patient presents with  . 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 Sleeping soundly, difficult to be aroused. -Admitted with acute cystitis with intractable nausea vomiting  REVIEW OF SYSTEMS:  Review of Systems  Unable to perform ROS: Psychiatric disorder    DRUG ALLERGIES:   Allergies  Allergen Reactions  . Depakote [Valproic Acid]   . Divalproex Sodium Other (See Comments)    VITALS:  Blood pressure (!) 121/51, pulse 75, temperature 98.3 F (36.8 C), temperature source Oral, resp. rate 16, height 4\' 11"  (1.499 m), weight 54 kg (119 lb), SpO2 95 %.  PHYSICAL EXAMINATION:  Physical Exam  GENERAL:  76 y.o.-year-old patient lying in the bed with no acute distress. Sleeping with her tongue hanging out EYES: Pupils equal, round, reactive to light and accommodation. No scleral icterus. Extraocular muscles intact.  HEENT: Head atraumatic, normocephalic. Oropharynx and nasopharynx clear.  NECK:  Supple, no jugular venous distention. No thyroid enlargement, no tenderness.  LUNGS: Normal breath sounds bilaterally, no wheezing, rales,rhonchi or crepitation. No use of accessory muscles of respiration.  CARDIOVASCULAR: S1, S2 normal. No murmurs, rubs, or gallops.  ABDOMEN: Soft, nontender, nondistended. Bowel sounds present. No organomegaly or mass.  EXTREMITIES: No pedal edema, cyanosis, or clubbing.  NEUROLOGIC: Moving in bed, moving all extremities. Not following commands. PSYCHIATRIC: The patient is very drowsy, easily arousable but not following commands.  SKIN: No obvious rash, lesion, or ulcer.    LABORATORY PANEL:   CBC  Recent Labs Lab 04/01/17 0429  WBC 9.6  HGB 10.1*  HCT 31.6*  PLT 192    ------------------------------------------------------------------------------------------------------------------  Chemistries   Recent Labs Lab 03/31/17 1313 04/01/17 0429  NA 142 144  K 3.2* 3.6  CL 102 107  CO2 32 30  GLUCOSE 125* 90  BUN 19 16  CREATININE 0.63 0.67  CALCIUM 9.4 8.8*  AST 20  --   ALT 11*  --   ALKPHOS 208*  --   BILITOT 0.5  --    ------------------------------------------------------------------------------------------------------------------  Cardiac Enzymes  Recent Labs Lab 03/31/17 1313  TROPONINI 0.04*   ------------------------------------------------------------------------------------------------------------------  RADIOLOGY:  Ct Head Wo Contrast  Result Date: 03/31/2017 CLINICAL DATA:  76 year old female with headache and vomiting. EXAM: CT HEAD WITHOUT CONTRAST TECHNIQUE: Contiguous axial images were obtained from the base of the skull through the vertex without intravenous contrast. COMPARISON:  Head CT dated 01/17/2009 FINDINGS: Brain: There is moderate age-related atrophy and chronic microvascular ischemic changes. There is no acute intracranial hemorrhage. No mass effect or midline shift noted. No extra-axial fluid collection. Vascular: No hyperdense vessel or unexpected calcification. Skull: Normal. Negative for fracture or focal lesion. There is hyperostosis frontalis. Sinuses/Orbits: No acute finding. Other: Multiple exophytic high attenuating lesion in the scalp again noted some of which have slightly increased in size compared to the CT of 2010. These are not well characterized but may represent complex sebaceous cysts. Clinical correlation is recommended. IMPRESSION: 1. No acute intracranial hemorrhage. 2. Moderate age-related atrophy and chronic microvascular ischemic changes. 3. High attenuating scalp lesions as seen on the CT of 2010, incompletely characterized, possibly complex sebaceous cysts. Correlation with clinical exam  recommended. Electronically Signed   By: Elgie CollardArash  Radparvar M.D.   On: 03/31/2017 00:26   Ct Abdomen Pelvis W Contrast  Result Date: 03/31/2017 CLINICAL DATA:  Vomiting for 2 days.  Prior cholecystectomy. EXAM: CT ABDOMEN AND PELVIS WITH CONTRAST TECHNIQUE: Multidetector CT imaging of the abdomen and pelvis was performed using the standard protocol following bolus administration of intravenous contrast. CONTRAST:  80mL ISOVUE-300 IOPAMIDOL (ISOVUE-300) INJECTION 61% COMPARISON:  None. FINDINGS: Lower chest: Mild patchy tree-in-bud opacities with small parenchymal band in the left lower lobe. Patulous fluid-filled lower thoracic esophagus with associated circumferential esophageal wall thickening. Cardiomegaly. Coronary atherosclerosis. Hepatobiliary: Normal liver size. No liver mass. Cholecystectomy. Bile ducts are within normal post cholecystectomy limits with common bile duct diameter 5 mm. Pancreas: Normal, with no mass or duct dilation. Spleen: Normal size. No mass. Adrenals/Urinary Tract: Normal adrenals. Subcentimeter hypodense renal cortical lesion in the anterior interpolar left kidney is too small to characterize and requires no further follow-up. Otherwise normal kidneys, with no hydronephrosis. There is irregular wall thickening with mucosal hyperenhancement involving the posterior bladder wall bilaterally with associated haziness of the perivesical fat. A few scattered gas bubbles are noted in the nondependent bladder. Stomach/Bowel: Small hiatal hernia. Otherwise grossly normal stomach. Normal caliber small bowel with no small bowel wall thickening. Appendix not discretely visualized. No pericecal inflammatory changes. Normal large bowel with no diverticulosis, large bowel wall thickening or pericolonic fat stranding. Vascular/Lymphatic: Atherosclerotic nonaneurysmal abdominal aorta. Patent portal, splenic, hepatic and renal veins. No pathologically enlarged lymph nodes in the abdomen or pelvis.  Reproductive: Status post hysterectomy, with no abnormal findings at the vaginal cuff. No adnexal mass. Other: No pneumoperitoneum, ascites or focal fluid collection. Musculoskeletal: No aggressive appearing focal osseous lesions. Sclerotic anterior left acetabular lesion is nonspecific and probably a benign bone island. Marked thoracolumbar spondylosis. Bilateral L5 pars defects with moderate degenerative disc disease and 4 mm anterolisthesis at L5-S1. Lateral left tenth rib nondisplaced fracture, which may be acute. IMPRESSION: 1. Irregular wall thickening and mucosal hyperenhancement involving the posterior bladder wall bilaterally with associated perivesical fat haziness, nonspecific, most likely representing an unusual distribution of acute cystitis. Correlation with urinalysis advised. Cystoscopic correlation could be obtained as clinically warranted. Tiny gas bubbles in the nondependent bladder are presumably related to instrumentation, correlate with clinical history . 2. Small hiatal hernia . Patulous fluid-filled lower thoracic esophagus, indicating esophageal dysmotility and/ or gastroesophageal reflux. Associated circumferential lower thoracic esophageal wall thickening, nonspecific, most commonly due to reflux esophagitis. Endoscopic correlation could be obtained as clinically warranted . 3. Mild patchy tree-in-bud opacities at the left lung base, indicating a nonspecific infectious or inflammatory bronchiolitis, with the differential including aspiration . 4. Nondisplaced lateral left tenth rib fracture, which may be acute. 5. Aortic Atherosclerosis (ICD10-I70.0). Coronary atherosclerosis. Cardiomegaly. Electronically Signed   By: Delbert Phenix M.D.   On: 03/31/2017 15:53    EKG:   Orders placed or performed during the hospital encounter of 03/31/17  . ED EKG  . ED EKG  . EKG 12-Lead  . EKG 12-Lead  . EKG 12-Lead  . EKG 12-Lead  . EKG 12-Lead  . 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  #1 acute cystitis-sent for urine cultures. -Started on Rocephin. -Gentle hydration as needed. If nausea and vomiting are resolved, can be started on oral diet. -Changed to oral antibiotics based on sensitivities.  #2 seizure disorder-continue home medications-on phenytoin and  phenobarbital  #3 hypertension-patient on metoprolol and Imdur.  #3 hypokalemia-was replaced.  #4 DVT prophylaxis-on Lovenox     All the records are reviewed and case discussed with Care Management/Social Workerr. Management plans discussed with the patient, family and they are in agreement.  CODE STATUS: Full code  TOTAL TIME TAKING CARE OF THIS PATIENT: 33 minutes.   POSSIBLE D/C IN 1-2 DAYS, DEPENDING ON CLINICAL CONDITION.   Enid Baas M.D on 04/01/2017 at 9:23 AM  Between 7am to 6pm - Pager - [phone]  After 6pm go to [email or web address] - Social research officer, government  [facility]  Office  [phone]  CC: Primary care physician; Lauro Regulus, MD

## 2017-04-01 NOTE — Clinical Social Work Note (Signed)
Clinical Social Work Assessment  Patient Details  Name: Hannah Vaughan MRN: [id number] Date of Birth: 12/13/1940  Date of referral:  04/01/17               Reason for consult:  Other (Comment Required) (Patient is from [facility]. )                Permission sought to share information with:  Facility Industrial/product designerContact Representative Permission granted to share information::  Yes, Verbal Permission Granted  Name::        Agency::     Relationship::     Contact Information:     Housing/Transportation Living arrangements for the past 2 months:  Group Home Source of Information:  Facility, Guardian Patient Interpreter Needed:  None Criminal Activity/Legal Involvement Pertinent to Current Situation/Hospitalization:  No - Comment as needed Significant Relationships:  Other Family Members Lives with:  Facility Resident Do you feel safe going back to the place where you live?  Yes Need for family participation in patient care:  Yes (Comment)  Care giving concerns:  Patient is a resident at [facility] in [place].    Social Worker assessment / plan:  Visual merchandiserClinical Social Worker (CSW) received verbal consult from Scientist, physiologicalN case manager that patient is from [facility]. CSW contacted patient's sister/ guardian Lucendia HerrlichFaye to complete assessment. Per Lucendia HerrlichFaye patient has lived at [facility] for 20 years. Per Lucendia HerrlichFaye she lives in [place] and is patient's legal guardian. Lucendia HerrlichFaye reported that patient was at their brother's house on Friday for a cook out and started vomiting, which continued throughout the weekend. Lucendia HerrlichFaye is agreeable for patient to return to [facility]. CSW contacted Maury DusDonna RN for [facility] who reported that patient walks with a walker at baseline and does well in the day program. Per Lupita LeashDonna patient is also on room air at baseline. CSW also received a call from [facility] RN Sherri who reported that patient can return to [facility] when stable. CSW will continue to follow and assist as  needed.   Employment status:  Disabled (Comment on whether or not currently receiving Disability) Insurance information:  [place]ic[place], [place] Recommendations:  Not assessed at this time Information / Referral to community resources:  Other (Comment Required) (Patient will return to [facility]. )  Patient/Family's Response to care:  Patient's guardian is agreeable for patient to return to [facility].   Patient/Family's Understanding of and Emotional Response to Diagnosis, Current Treatment, and Prognosis:  Patient's guardian was very pleasant and thanked CSW for calling.   Emotional Assessment Appearance:  Appears stated age Attitude/Demeanor/Rapport:  Unable to Assess Affect (typically observed):  Unable to Assess Orientation:  Oriented to Self, Fluctuating Orientation (Suspected and/or reported Sundowners) Alcohol / Substance use:  Not Applicable Psych involvement (Current and /or in the community):  No (Comment)  Discharge Needs  Concerns to be addressed:  Discharge Planning Concerns Readmission within the last 30 days:  No Current discharge risk:  Cognitively Impaired Barriers to Discharge:  Continued Medical Work up   [employer], Darleen CrockerBailey M, LCSW 04/01/2017, 4:23 PM

## 2017-04-01 NOTE — [place] FL2 (Signed)
[place] MEDICAID FL2 LEVEL OF CARE SCREENING TOOL     IDENTIFICATION  Patient Name: Hannah Vaughan Birthdate: [birth year] Sex: female Admission Date (Current Location): 03/31/2017  [place] and [place] Number:  Chiropodist and Address:  [facility], [address], [place], [place] [address]      Provider Number: [id number]  Attending Physician Name and Address:  Enid Baas, MD  Relative Name and Phone Number:       Current Level of Care: Hospital Recommended Level of Care: Other (Comment) [name]. ) Prior Approval Number:    Date Approved/Denied:   PASRR Number:  ([id number] )  Discharge Plan: Domiciliary (Rest home) [name]. )    Current Diagnoses: Patient Active Problem List   Diagnosis Date Noted  . UTI (urinary tract infection) 03/31/2017  . NSTEMI (non-ST elevated myocardial infarction) (HCC) 11/26/2016  . Acute respiratory failure with hypoxemia (HCC) 05/06/2015  . HTN (hypertension) 05/06/2015  . Seizure disorder (HCC) 05/06/2015  . CAD (coronary artery disease) 05/06/2015  . Osteoarthritis 05/06/2015  . GERD (gastroesophageal reflux disease) 05/06/2015  . HCAP (healthcare-associated pneumonia) 05/06/2015  . Abnormal ECG 06/24/2014  . 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 Dependent edema 06/06/2014    Orientation RESPIRATION BLADDER Height & Weight     Self  Normal Incontinent Weight: 119 lb (54 kg) Height:  4\' 11"  (149.9 cm)  BEHAVIORAL SYMPTOMS/MOOD NEUROLOGICAL BOWEL NUTRITION STATUS   (none) Convulsions/Seizures (History of Seizures. ) Continent Diet (DYS 1 (puree diet at home with nectar thick liquids). )  AMBULATORY STATUS COMMUNICATION OF NEEDS Skin   Limited Assist Verbally Normal                       Personal Care Assistance Level of Assistance  Bathing, Feeding, Dressing Bathing Assistance: Limited assistance Feeding assistance: Independent Dressing Assistance:  Limited assistance     Functional Limitations Info  Sight, Hearing, Speech Sight Info: Impaired Hearing Info: Adequate Speech Info: Adequate    SPECIAL CARE FACTORS FREQUENCY                       Contractures      Additional Factors Info  Code Status, Allergies Code Status Info:  (Full Code. ) Allergies Info:  (Depakote Valproic Acid, Divalproex Sodium)           Current Medications (04/01/2017):  This is the current hospital active medication list Current Facility-Administered Medications  Medication Dose Route Frequency Provider Last Rate Last Dose  . acetaminophen (TYLENOL) tablet 650 mg  650 mg Oral Q6H PRN Oralia Manis, MD       Or  . acetaminophen (TYLENOL) suppository 650 mg  650 mg Rectal Q6H PRN Oralia Manis, MD      . aspirin EC tablet 81 mg  81 mg Oral Daily Oralia Manis, MD   81 mg at 04/01/17 2952  . cefTRIAXone (ROCEPHIN) 1 g in dextrose 5 % 50 mL IVPB  1 g Intravenous Q24H Coffee, Gerre Pebbles, RPH      . enoxaparin (LOVENOX) injection 40 mg  40 mg Subcutaneous Q24H Oralia Manis, MD      . fluconazole (DIFLUCAN) tablet 100 mg  100 mg Oral Daily Enid Baas, MD   100 mg at 04/01/17 1426  . gabapentin (NEURONTIN) capsule 600 mg  600 mg Oral QID Oralia Manis, MD   600 mg at 04/01/17 1426  . isosorbide mononitrate (IMDUR) 24 hr tablet  60 mg  60 mg Oral Daily Oralia ManisWillis, David, MD   60 mg at 04/01/17 [id number]  . LORazepam (ATIVAN) tablet 0.5 mg  0.5 mg Oral Willow OraQ8H Willis, David, MD   0.5 mg at 04/01/17 1426  . metoprolol succinate (TOPROL-XL) 24 hr tablet 25 mg  25 mg Oral Daily Oralia ManisWillis, David, MD   25 mg at 04/01/17 [id number]  . morphine 2 MG/ML injection 2 mg  2 mg Intravenous Once Oralia ManisWillis, David, MD      . ondansetron [facility](ZOFRAN) tablet 4 mg  4 mg Oral Q6H PRN Oralia ManisWillis, David, MD       Or  . ondansetron [facility](ZOFRAN) injection 4 mg  4 mg Intravenous Q6H PRN Oralia ManisWillis, David, MD      . pantoprazole (PROTONIX) EC tablet 40 mg  40 mg Oral Daily Oralia ManisWillis, David, MD   40 mg at 04/01/17  [id number]  . PHENObarbital 20 MG/5ML elixir 60 mg  60 mg Oral QHS Oralia ManisWillis, David, MD      . phenytoin (DILANTIN) ER capsule 100 mg  100 mg Oral TID Oralia ManisWillis, David, MD   100 mg at 04/01/17 [id number]  . traMADol (ULTRAM) tablet 50 mg  50 mg Oral Q6H PRN Oralia ManisWillis, David, MD         Discharge Medications: Please see discharge summary for a list of discharge medications.  Relevant Imaging Results:  Relevant Lab Results:   Additional Information  (SSN: [id number])  Moo Gravley, Darleen CrockerBailey M, LCSW

## 2017-04-01 NOTE — Plan of Care (Signed)
Problem: SLP Dysphagia Goals Goal: Misc Dysphagia Goal Pt will safely tolerate po diet of least restrictive consistency w/ no overt s/s of aspiration noted by Staff/pt/family x1-2 sessions.      

## 2017-04-01 NOTE — Progress Notes (Addendum)
Pharmacy Antibiotic Note  Hannah Vaughan is a 76 y.o. female admitted on 03/31/2017 with UTI.  Pharmacy has been consulted for ceftriaxone dosing.  Plan: Ceftriaxone 1 gram q 24 hours ordered for the treatment of UTI.  Height: 4\' 11"  (149.9 cm) Weight: 119 lb (54 kg) IBW/kg (Calculated) : 43.2  Temp (24hrs), Avg:98.4 F (36.9 C), Min:98 F (36.7 C), Max:98.8 F (37.1 C)   Recent Labs Lab 03/30/17 1814 03/31/17 1313 04/01/17 0429  WBC 7.3 7.6 9.6  CREATININE 0.67 0.63 0.67    Estimated Creatinine Clearance: 46.3 mL/min (by C-G formula based on SCr of 0.67 mg/dL).    Allergies  Allergen Reactions  . Depakote [Valproic Acid]   . Divalproex Sodium Other (See Comments)    Antimicrobials this admission: Ceftriaxone 7/15  >>    >>   Microbiology results:  Recent Results (from the past 240 hour(s))  MRSA PCR Screening     Status: None   Collection Time: 03/31/17 11:25 PM  Result Value Ref Range Status   MRSA by PCR NEGATIVE NEGATIVE Final    Comment:        The GeneXpert MRSA Assay (FDA approved for NASAL specimens only), is one component of a comprehensive MRSA colonization surveillance program. It is not intended to diagnose MRSA infection nor to guide or monitor treatment for MRSA infections.    Urinalysis    Component Value Date/Time   COLORURINE AMBER (A) 03/31/2017 0005   APPEARANCEUR CLOUDY (A) 03/31/2017 0005   APPEARANCEUR Cloudy 07/13/2014 1105   LABSPEC 1.024 03/31/2017 0005   LABSPEC 1.018 07/13/2014 1105   PHURINE 5.0 03/31/2017 0005   GLUCOSEU NEGATIVE 03/31/2017 0005   GLUCOSEU Negative 07/13/2014 1105   HGBUR LARGE (A) 03/31/2017 0005   BILIRUBINUR NEGATIVE 03/31/2017 0005   BILIRUBINUR 1+ 07/13/2014 1105   KETONESUR NEGATIVE 03/31/2017 0005   PROTEINUR 100 (A) 03/31/2017 0005   NITRITE NEGATIVE 03/31/2017 0005   LEUKOCYTESUR LARGE (A) 03/31/2017 0005   LEUKOCYTESUR 2+ 07/13/2014 1105      7/15 UA: LE(+) NO2 (-) WBC TNTC Thank you  for allowing pharmacy to be a part of this patient's care.  Gerre PebblesGarrett Hye Trawick 04/01/2017 8:25 AM

## 2017-04-01 NOTE — Evaluation (Addendum)
Clinical/Bedside Swallow Evaluation Patient Details  Name: Hannah Vaughan MRN: [id number] Date of Birth: Oct 12, 1940  Today's Date: 04/01/2017 Time: SLP Start Time (ACUTE ONLY): 1235 SLP Stop Time (ACUTE ONLY): 1335 SLP Time Calculation (min) (ACUTE ONLY): 60 min  Past Medical History:  Past Medical History:  Diagnosis Date  . CAD (coronary artery disease)   . GERD (gastroesophageal reflux disease)   . History of hiatal hernia   . HTN (hypertension)   . Mental retardation   . Osteoarthritis   . Pneumococcus infection   . Seasonal allergies   . Seizure (HCC)   . Seizure [facility](HCC)    Past Surgical History:  Past Surgical History:  Procedure Laterality Date  . CHOLECYSTECTOMY     HPI:  Pt is a 76 y.o. female who presents with several episodes of vomiting. Patient has a history of mental retardation and is unable to contribute to her history, CAD, GERD, HTN, OA, Seizures, Hiatal Hernia. Caregiver with her in the ED and provided the history. ED evaluation showed UTI. Hospitalists were called for admission. Patient caregiver at bedside stated that patient eats puree diet at home with nectar thick liquids.    Assessment / Plan / Recommendation Clinical Impression  Pt appears to present w/ moderate oropharyngeal phase dysphagia and may be at her baseline w/ regard to her swallowing presentation today. At baseline, pt is on a Dysphagia level 1(puree) w/ Nectar consistency liquids per Caregiver report. Pt was fed, and/or assisted in eating, this consistency diet during lunch meal. Moderate oral phase dysphagia was present w/ pt having difficulty relaxing her tongue to allow placement of spoon/bolus orally in her mouth w/out min manipulation. Due to the lingual protrusion and smacking/licking, there was decreased lingual coordination resulting in anterior bolus loss. During the pharyngeal phase, no overt s/s of aspiration were noted, however, suspect delayed pharyngeal swallowing initiation as  evidenced by multiple, hard swallows at times - especially during larger sips of Nectar liquids. Pt did like to hold the cup herself to drink but required monitoring to lessen impulsive drinking and large sips (this could increase risk for episodes of choking and aspiration to occur). Recommend continue w/ current diet of puree and Nectar consistency liquids w/ aspiration precautions; feeding support and monitoring at meals. Recommend Pills given in Puree - Crushed. Due to findings on the CT of abdomen of Patulous fluid-filled lower thoracic esophagus with associated circumferential esophageal wall thickening most commonly due to reflux Esophagitis, SLP recommended f/u w/ PPI and possible consideration of treatment for Thrush as pt is also c/o sore throat to NSG staff at times. MD consulted; agreed.  SLP Visit Diagnosis: Dysphagia, oropharyngeal phase (R13.12)    Aspiration Risk  Moderate aspiration risk    Diet Recommendation  Dysphagia level 1 (puree) w/ Nectar consistency liquids; strict aspiration precautions; feeding assistance at all meals  Medication Administration: Crushed with puree    Other  Recommendations Recommended Consults:  (Dietician consult) Oral Care Recommendations: Oral care BID;Staff/trained caregiver to provide oral care Other Recommendations: Order thickener from pharmacy;Prohibited food (jello, ice cream, thin soups);Remove water pitcher;Have oral suction available   Follow up Recommendations Skilled Nursing facility      Frequency and Duration min 1 x/week  1 week       Prognosis Prognosis for Safe Diet Advancement: Fair Barriers to Reach Goals: Cognitive deficits;Severity of deficits;Time post onset Barriers/Prognosis Comment: pt appears at/near her baseline      Swallow Study   General Date of Onset: 03/31/17 HPI:  Pt is a 76 y.o. female who presents with several episodes of vomiting. Patient has a history of mental retardation and is unable to contribute to  her history, CAD, GERD, HTN, OA, Seizures, Hiatal Hernia. Caregiver with her in the ED and provided the history. ED evaluation showed UTI. Hospitalists were called for admission. Patient caregiver at bedside stated that patient eats puree diet at home with nectar thick liquids.  Type of Study: Bedside Swallow Evaluation Previous Swallow Assessment: assessments in the past as pt is now on a dysphagia diet Diet Prior to this Study: Dysphagia 1 (puree);Nectar-thick liquids Temperature Spikes Noted: No (wbc 9.6) Respiratory Status: Nasal cannula (2 liters) History of Recent Intubation: No Behavior/Cognition: Alert;Cooperative;Pleasant mood;Confused;Requires cueing (lingual protrusion at rest) Oral Cavity Assessment:  (difficult to assess d/t lingual movements; Cognition) Oral Care Completed by SLP: Recent completion by staff Oral Cavity - Dentition: Edentulous Vision: Functional for self-feeding Self-Feeding Abilities: Able to feed self;Needs set up;Needs assist;Total assist Patient Positioning: Upright in bed;Postural control adequate for testing Baseline Vocal Quality:  (mumbled responses x2) Volitional Cough: Cognitively unable to elicit Volitional Swallow: Unable to elicit    Oral/Motor/Sensory Function Overall Oral Motor/Sensory Function: Moderate impairment (lingual protrusion; smacking/licking)   Ice Chips Ice chips: Not tested   Thin Liquid Thin Liquid: Not tested    Nectar Thick Nectar Thick Liquid: Impaired Presentation: Cup;Self Fed;Spoon (~6-8 ozs total of drinks) Oral Phase Impairments: Reduced labial seal;Reduced lingual movement/coordination (lingual protrusion; smacking/licking) Oral phase functional implications:  (anterior loss) Pharyngeal Phase Impairments: Suspected delayed Swallow (audible swallowing) Other Comments: pt did like to hold cup for drinking; she was unable to relax the tongue to open mouth fully to allow bolus to be fed to her w/out min manipulation   Honey  Thick Honey Thick Liquid: Not tested   Puree Puree: Impaired Presentation: Spoon (fed; 15+ boluses) Oral Phase Impairments: Reduced labial seal;Reduced lingual movement/coordination (similar to the Nectar liquid trials) Oral Phase Functional Implications: Prolonged oral transit (anterior spillage similar to the Nectar liquid trials) Pharyngeal Phase Impairments:  (audible swallows; lingual protrusion) Other Comments: similar to Nectar liquid trials   Solid   GO   Solid: Not tested Other Comments: not her baseline diet consistency    Functional Assessment Tool Used: clinical judgement Functional Limitations: Swallowing Swallow Current Status (Z6109): At least 60 percent but less than 80 percent impaired, limited or restricted Swallow Goal Status [id number]): At least 60 percent but less than 80 percent impaired, limited or restricted Swallow Discharge Status [phone]): At least 60 percent but less than 80 percent impaired, limited or restricted    Jerilynn Som, MS, CCC-SLP Juliana Boling 04/01/2017,2:54 PM

## 2017-04-01 NOTE — Progress Notes (Signed)
Patient caregiver at bedside, states that patient eats puree diet at home with nectar thick liquids. She has also been pointing to her throat, MD notified and ordered speech eval. RN crushed meds and put them in applesauce.   Harvie HeckMelanie Johnnette Laux, RN

## 2017-04-01 NOTE — Care Management Obs Status (Signed)
MEDICARE OBSERVATION STATUS NOTIFICATION   Patient Details  Name: Hannah Vaughan MRN: [id number] Date of Birth: 1941-04-12   Medicare Observation Status Notification Given:  Yes (given to caregiver at bedside. )    Marily MemosLisa M Vandell Kun, RN 04/01/2017, 9:32 AM

## 2017-04-01 NOTE — Evaluation (Signed)
Physical Therapy Evaluation Patient Details Name: Hannah Vaughan MRN: [id number] DOB: 1941-08-19 Today's Date: 04/01/2017   History of Present Illness  Pt is a 76 y.o. female who presents with several episodes of vomiting. Patient has a history of mental retardation and is unable to contribute to her history, CAD, GERD, HTN, OA, Seizures, Hiatal Hernia. Caregiver with her in the ED and provided the history. ED evaluation showed UTI.  Clinical Impression  76 yo Female presents to hospital with weakness/UTI. Patient has a history of mental retardation and is unable to answer questions during history intake. She was living at [facility] prior to admittance. Patient's nurse states that she was walking in the group home with a RW and with assistance. Today she is supervision/min A for bed mobility. She transfers sit<>Stand with RW for min A; Patient ambulated 30 feet with RW, min to mod A. She requires increased assistance during turns and when walking in narrow spaces. Patient had difficulty with RW requiring assistance for walker placement especially during turns or when walking around obstacles. Patient was unable to follow commands for MMT. She does exhibit some weakness with fatigue during ambulation. Patient would benefit from additional skilled PT intervention to improve functional mobility and safety with ADLs. Patient would benefit from additional skilled PT intervention upon discharge. Unsure if patient is functioning at baseline as patient unable to verbalize baseline during history. Recommend SNF upon discharge for 24 hour assistance due to patients impaired safety awareness and limited gait ability.     Follow Up Recommendations SNF;Supervision/Assistance - 24 hour    Equipment Recommendations  None recommended by PT    Recommendations for Other Services       Precautions / Restrictions Precautions Precautions: Fall Restrictions Weight Bearing Restrictions: No       Mobility  Bed Mobility Overal bed mobility: Needs Assistance Bed Mobility: Supine to Sit;Sit to Supine     Supine to sit: Supervision Sit to supine: Min assist   General bed mobility comments: Patient requires several cues for task. She was able to sit up without bed rail but had difficulty getting LEs into bed when transitioning sit to supine; unsure if this is related to poor understanding of direction;   Transfers Overall transfer level: Needs assistance Equipment used: Rolling walker (2 wheeled) Transfers: Sit to/from Stand Sit to Stand: Min assist         General transfer comment: required cues to hold onto RW for safety and to improve forward weight shift for better transfer x2 reps;   Ambulation/Gait Ambulation/Gait assistance: Mod assist Ambulation Distance (Feet): 30 Feet Assistive device: Rolling walker (2 wheeled) Gait Pattern/deviations: Step-through pattern;Decreased dorsiflexion - right;Decreased dorsiflexion - left;Decreased step length - right;Decreased step length - left;Trunk flexed;Shuffle Gait velocity: decreased   General Gait Details: slow gait speed; requires min-mod A for safety (mod A with turns); requires cues for walker placement, stay close to walker for safety; Patient required mod A for directing RW at times due to patient running into obstacles. She exhibitis decreased safety awareness requiring mod VCs for attention to task and direction.   Stairs            Wheelchair Mobility    Modified Rankin (Stroke Patients Only)       Balance Overall balance assessment: Needs assistance Sitting-balance support: Bilateral upper extremity supported Sitting balance-Leahy Scale: Fair Sitting balance - Comments: does have some unsteadiness sitting on edge of bed being able to keep balance against pertubations or  reaching outside base of support;    Standing balance support: Bilateral upper extremity supported Standing balance-Leahy Scale:  Poor Standing balance comment: requires mod A during gait tasks with 2 HHA on RW                             Pertinent Vitals/Pain Pain Assessment: No/denies pain    Home Living Family/patient expects to be discharged to:: Group home                      Prior Function Level of Independence: Needs assistance   Gait / Transfers Assistance Needed: pt unable to answer questions during history intake; per nursing patient would walk with assistance and RW at group home;            Hand Dominance        Extremity/Trunk Assessment   Upper Extremity Assessment Upper Extremity Assessment: Overall WFL for tasks assessed    Lower Extremity Assessment Lower Extremity Assessment: Generalized weakness (unable to MMT due to confusion; however exhibits functional strength with being able to stand min A with RW; )       Communication   Communication: Expressive difficulties  Cognition Arousal/Alertness: Awake/alert Behavior During Therapy: Restless;WFL for tasks assessed/performed Overall Cognitive Status: Difficult to assess                                 General Comments: patient unable to answer questions during history intake;       General Comments      Exercises     Assessment/Plan    PT Assessment Patient needs continued PT services  PT Problem List Decreased strength;Decreased mobility;Decreased safety awareness;Decreased activity tolerance;Decreased balance;Decreased knowledge of use of DME       PT Treatment Interventions DME instruction;Therapeutic activities;Gait training;Therapeutic exercise;Balance training;Functional mobility training;Neuromuscular re-education;Patient/family education    PT Goals (Current goals can be found in the Care Plan section)  Acute Rehab PT Goals Patient Stated Goal: Unstated; patient appears to want to get better PT Goal Formulation: With patient Time For Goal Achievement: 04/15/17 Potential  to Achieve Goals: Good    Frequency Min 2X/week   Barriers to discharge   unsure level of support at group home;     Co-evaluation               AM-PAC PT "6 Clicks" Daily Activity  Outcome Measure Difficulty turning over in bed (including adjusting bedclothes, sheets and blankets)?: A Little Difficulty moving from lying on back to sitting on the side of the bed? : A Little Difficulty sitting down on and standing up from a chair with arms (e.g., wheelchair, bedside commode, etc,.)?: Total Help needed moving to and from a bed to chair (including a wheelchair)?: A Lot Help needed walking in hospital room?: A Lot Help needed climbing 3-5 steps with a railing? : A Lot 6 Click Score: 13    End of Session Equipment Utilized During Treatment: Gait belt Activity Tolerance: Patient tolerated treatment well;No increased pain Patient left: in bed;with call bell/phone within reach;with bed alarm set Nurse Communication: Mobility status PT Visit Diagnosis: Unsteadiness on feet (R26.81);Muscle weakness (generalized) (M62.81)    Time: 7425-9563 PT Time Calculation (min) (ACUTE ONLY): 17 min   Charges:   PT Evaluation $PT Eval Low Complexity: 1 Procedure     PT G Codes:   PT  G-Codes **NOT FOR INPATIENT CLASS** Functional Assessment Tool Used: AM-PAC 6 Clicks Basic Mobility;Clinical judgement Functional Limitation: Mobility: Walking and moving around Mobility: Walking and Moving Around Current Status (J4782(G8978): At least 40 percent but less than 60 percent impaired, limited or restricted Mobility: Walking and Moving Around Goal Status [phone](G8979): At least 20 percent but less than 40 percent impaired, limited or restricted      Lyndi Holbein PT, DPT 04/01/2017, 5:26 PM

## 2017-04-02 DIAGNOSIS — N3001 Acute cystitis with hematuria: Secondary | ICD-10-CM | POA: Diagnosis not present

## 2017-04-02 LAB — BASIC METABOLIC PANEL
ANION GAP: 7 (ref 5–15)
BUN: 25 mg/dL — AB (ref 6–20)
CO2: 30 mmol/L (ref 22–32)
Calcium: 8.5 mg/dL — ABNORMAL LOW (ref 8.9–10.3)
Chloride: 106 mmol/L (ref 101–111)
Creatinine, Ser: 0.68 mg/dL (ref 0.44–1.00)
GFR calc Af Amer: >60 mL/min (ref >60)
GLUCOSE: 104 mg/dL — AB (ref 65–99)
POTASSIUM: 3.6 mmol/L (ref 3.5–5.1)
Sodium: 143 mmol/L (ref 135–145)

## 2017-04-02 LAB — URINE CULTURE: Culture: >=100000 — AB

## 2017-04-02 MED ORDER — CEFUROXIME AXETIL 500 MG PO TABS: 500.0000 mg | ORAL_TABLET | Freq: Two times a day (BID) | ORAL | 0 refills | Status: DC

## 2017-04-02 MED ORDER — CEFUROXIME AXETIL 500 MG PO TABS: 500.0000 mg | ORAL_TABLET | Freq: Two times a day (BID) | ORAL | 0 refills | Status: AC

## 2017-04-02 MED ORDER — FLUCONAZOLE 100 MG PO TABS: 100.0000 mg | ORAL_TABLET | Freq: Every day | ORAL | 0 refills | Status: DC

## 2017-04-02 NOTE — Progress Notes (Signed)
Patient group home will come to pick patient up. Patient have no SS of distress or no complaints.

## 2017-04-02 NOTE — Progress Notes (Addendum)
Clinical Child psychotherapistocial Worker (CSW) received a call back from Lyondell ChemicalSherrie RN from [facility]. Per Sherrie patient uses a gait belt and walks with a walker with assistance at baseline. Per Sherrie patient can [facility] to [facility]. CSW will continue to follow and assist as needed.   Baker Hughes IncorporatedBailey Liv Rallis, LCSW [phone]

## 2017-04-02 NOTE — Progress Notes (Addendum)
Clinical Child psychotherapistocial Worker (CSW) received a call from [place] Blunt with [facility] this morning. CSW made [place]Brittany aware that PT is recommending SNF however patient walked 30 feet. Per [place]Brittany she will contact [facility] nurse Sherrie to come assess patient to see if [facility] can meet her needs at the group home. CSW also attempted to contact Sherrie however she did not answer and her voicemail was full. CSW will continue to follow and assist as needed.   Per RN patient has a "lateral left 10 th rib nondisplaced fracture may be acute." Per RN patient does not appear to be in pain. CSW made [place]any Blunt with [facility] aware of above.    Baker Hughes IncorporatedBailey Rayane Gallardo, Hannah Vaughan [phone]

## 2017-04-02 NOTE — NC FL2 (Signed)
[place] MEDICAID FL2 LEVEL OF CARE SCREENING TOOL     IDENTIFICATION  Patient Name: Hannah Vaughan Birthdate: 1940/10/14 Sex: female Admission Date (Current Location): 03/31/2017  [place] and [place] Number:  Chiropodist and Address:  [facility], [address] [place], [place] [address]      Provider Number: [id number]  Attending Physician Name and Address:  Enid Baas, MD  Relative Name and Phone Number:       Current Level of Care: Hospital Recommended Level of Care: Other (Comment) [name]. ) Prior Approval Number:    Date Approved/Denied:   PASRR Number:  ([id number] )  Discharge Plan: Domiciliary (Rest home) [name]. )    Current Diagnoses: Patient Active Problem List   Diagnosis Date Noted  . UTI (urinary tract infection) 03/31/2017  . NSTEMI (non-ST elevated myocardial infarction) (HCC) 11/26/2016  . Acute respiratory failure with hypoxemia (HCC) 05/06/2015  . HTN (hypertension) 05/06/2015  . Seizure disorder (HCC) 05/06/2015  . CAD (coronary artery disease) 05/06/2015  . Osteoarthritis 05/06/2015  . GERD (gastroesophageal reflux disease) 05/06/2015  . HCAP (healthcare-associated pneumonia) 05/06/2015  . Abnormal ECG 06/24/2014  . 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 Dependent edema 06/06/2014    Orientation RESPIRATION BLADDER Height & Weight     Self  Normal Incontinent Weight: 119 lb (54 kg) Height:  4\' 11"  (149.9 cm)  BEHAVIORAL SYMPTOMS/MOOD NEUROLOGICAL BOWEL NUTRITION STATUS   (none) Convulsions/Seizures (History of Seizures. ) Continent Diet (DYS 1 (puree diet at home with nectar thick liquids). )  AMBULATORY STATUS COMMUNICATION OF NEEDS Skin   Limited Assist Verbally Normal                       Personal Care Assistance Level of Assistance  Bathing, Feeding, Dressing Bathing Assistance: Limited assistance Feeding assistance: Independent Dressing Assistance:  Limited assistance     Functional Limitations Info  Sight, Hearing, Speech Sight Info: Impaired Hearing Info: Adequate Speech Info: Adequate    SPECIAL CARE FACTORS FREQUENCY   PT home health     2-3 days per week.                 Contractures      Additional Factors Info  Code Status, Allergies Code Status Info:  (Full Code. ) Allergies Info:  (Depakote Valproic Acid, Divalproex Sodium)          Discharge Medications: Please see discharge summary for a list of discharge medications. Medication List     STOP taking these medications   cephALEXin 500 MG capsule Commonly known as:  KEFLEX   hydrochlorothiazide 25 MG tablet Commonly known as:  HYDRODIURIL   triamcinolone cream 0.1 % Commonly known as:  KENALOG     TAKE these medications   acetaminophen 325 MG tablet Commonly known as:  TYLENOL Take 1 tablet by mouth every 6 (six) hours.   aspirin EC 81 MG tablet Take 1 tablet by mouth daily.   CALCIUM 500/D 500-200 MG-UNIT tablet Generic drug:  calcium-vitamin D Take 1 tablet by mouth 2 (two) times daily.   carbamide peroxide 6.5 % OTIC solution Commonly known as:  DEBROX Place 5 drops into both ears every 14 (fourteen) days.   cefUROXime 500 MG tablet Commonly known as:  CEFTIN Take 1 tablet (500 mg total) by mouth 2 (two) times daily. X 8 more days   cetirizine 10 MG tablet Commonly known as:  ZYRTEC Take 1 tablet  by mouth daily as needed.   cyanocobalamin 1000 MCG/ML injection Commonly known as:  (VITAMIN B-12) Inject 1 mL into the muscle every 30 (thirty) days.   eucerin cream Apply 1 application topically 2 (two) times daily.   fluconazole 100 MG tablet Commonly known as:  DIFLUCAN Take 1 tablet (100 mg total) by mouth daily. X 4 more days   gabapentin 600 MG tablet Commonly known as:  NEURONTIN Take 1 tablet by mouth 4 (four) times daily.   guaiFENesin 100 MG/5ML Soln Commonly known as:  ROBITUSSIN Take 5 mLs by  mouth every 4 (four) hours as needed for cough or to loosen phlegm.   hydrocortisone 25 MG suppository Commonly known as:  ANUSOL-HC Place 25 mg rectally 2 (two) times daily as needed for hemorrhoids or itching.   hydrocortisone-pramoxine rectal foam Commonly known as:  PROCTOFOAM-HC Place 1 applicator rectally 2 (two) times daily.   iron polysaccharides 150 MG capsule Commonly known as:  NIFEREX Take 150 mg by mouth daily.   isosorbide mononitrate 60 MG 24 hr tablet Commonly known as:  IMDUR Take 1 tablet by mouth daily.   loperamide 2 MG capsule Commonly known as:  IMODIUM Take 2 mg by mouth as needed for diarrhea or loose stools.   LORazepam 0.5 MG tablet Commonly known as:  ATIVAN Take 0.5 mg by mouth every 8 (eight) hours.   magnesium hydroxide 400 MG/5ML suspension Commonly known as:  MILK OF MAGNESIA Take 5 mLs by mouth as needed.   MALDROXAL ANTACID PO Take by mouth as needed.   metoprolol succinate 25 MG 24 hr tablet Commonly known as:  TOPROL-XL Take 1 tablet by mouth daily.   miconazole 2 % powder Commonly known as:  MICOTIN Apply 1 application topically 2 (two) times daily.   mupirocin ointment 2 % Commonly known as:  BACTROBAN Place 1 application into the nose 3 (three) times daily.   nitroGLYCERIN 0.4 MG SL tablet Commonly known as:  NITROSTAT Place 1 tablet under the tongue as needed.   nystatin powder Generic drug:  nystatin Apply 1 g topically as needed.   omeprazole 20 MG capsule Commonly known as:  PRILOSEC Take 1 capsule by mouth daily.   ondansetron 4 MG disintegrating tablet Commonly known as:  ZOFRAN ODT Take 1 tablet (4 mg total) by mouth every 8 (eight) hours as needed for nausea or vomiting.   PHENObarbital 20 MG/5ML elixir Take 15 mLs by mouth at bedtime.   phenytoin 100 MG ER capsule Commonly known as:  DILANTIN Take 100 mg by mouth 3 (three) times daily.   STOOL SOFTENER 100 MG capsule Generic drug:   docusate sodium Take 1 capsule by mouth 2 (two) times daily.   terbinafine 1 % cream Commonly known as:  LAMISIL Apply 1 application topically 2 (two) times daily.   tolnaftate 1 % spray Commonly known as:  TINACTIN Apply 1 application topically 2 (two) times daily.   traMADol 50 MG tablet Commonly known as:  ULTRAM Take 1 tablet (50 mg total) by mouth every 6 (six) hours as needed.     Relevant Imaging Results: Relevant Lab Results: Additional Information  (SSN: [id number])  Saki Legore, Darleen CrockerBailey M, LCSW

## 2017-04-02 NOTE — Progress Notes (Signed)
Physical Therapy Treatment Patient Details Name: Hannah GroutDoris E Vaughan MRN: [id number] DOB: 1941/03/09 Today's Date: 04/02/2017    History of Present Illness Pt is a 76 y.o. female who presents with several episodes of vomiting. Patient has a history of mental retardation and is unable to contribute to her history, CAD, GERD, HTN, OA, Seizures, Hiatal Hernia. Caregiver with her in the ED and provided the history. ED evaluation showed UTI.    PT Comments    Pt in bed, does not respond verbally to commands.  She required min assist for bed mobility with verbal and tactile cues for initiating tasks.  Once sitting she was able to sit with min guard for safety.  She was able to stand with min/mod a x 1.  She ambulated 10' with min/mod a x 1 with generally unsteady gait and assist to prevent falls sideways.  Wheelchair was pulled behind her and she quickly sat in the chair with poor control.  She then transferred to recliner at bedside with min a x 1.  General poor safety and balance with gait today.  Pt remained up in recliner after session.  Discussed with RN.   Follow Up Recommendations  SNF;Supervision/Assistance - 24 hour     Equipment Recommendations  None recommended by PT    Recommendations for Other Services       Precautions / Restrictions Precautions Precautions: Fall Precaution Comments: Will sit quickly, impulsive at times Restrictions Weight Bearing Restrictions: No    Mobility  Bed Mobility Overal bed mobility: Needs Assistance Bed Mobility: Sit to Supine     Supine to sit: Min assist     General bed mobility comments: Pt with mod verbal cues, did well once initiated taks with min assist.  Transfers Overall transfer level: Needs assistance Equipment used: Rolling walker (2 wheeled) Transfers: Sit to/from Stand              Ambulation/Gait Ambulation/Gait assistance: Min assist;Mod assist Ambulation Distance (Feet): 10 Feet Assistive device: Rolling walker (2  wheeled) Gait Pattern/deviations: Step-through pattern;Decreased step length - right;Decreased step length - left;Trunk flexed Gait velocity: decreased Gait velocity interpretation: <1.8 ft/sec, indicative of risk for recurrent falls     Stairs            Wheelchair Mobility    Modified Rankin (Stroke Patients Only)       Balance Overall balance assessment: Needs assistance Sitting-balance support: Bilateral upper extremity supported Sitting balance-Leahy Scale: Fair Sitting balance - Comments: does have some unsteadiness sitting on edge of bed being able to keep balance against pertubations or reaching outside base of support;    Standing balance support: Bilateral upper extremity supported Standing balance-Leahy Scale: Poor                              Cognition Arousal/Alertness: Awake/alert Behavior During Therapy: Impulsive Overall Cognitive Status: Difficult to assess                                 General Comments: non-verbal during session      Exercises Other Exercises Other Exercises: attempted exercises in chair but pt unable to follow verbal and tactile commands AROM    General Comments        Pertinent Vitals/Pain Pain Assessment: No/denies pain    Home Living  Prior Function            PT Goals (current goals can now be found in the care plan section)      Frequency    Min 2X/week      PT Plan Current plan remains appropriate    Co-evaluation              AM-PAC PT "6 Clicks" Daily Activity  Outcome Measure  Difficulty turning over in bed (including adjusting bedclothes, sheets and blankets)?: A Little Difficulty moving from lying on back to sitting on the side of the bed? : A Little Difficulty sitting down on and standing up from a chair with arms (e.g., wheelchair, bedside commode, etc,.)?: Total Help needed moving to and from a bed to chair (including a  wheelchair)?: A Lot Help needed walking in hospital room?: A Lot Help needed climbing 3-5 steps with a railing? : Total 6 Click Score: 12    End of Session Equipment Utilized During Treatment: Gait belt Activity Tolerance: Patient tolerated treatment well;No increased pain Patient left: in chair;with chair alarm set;with call bell/phone within reach Nurse Communication: Mobility status       Time: 0981-1914 PT Time Calculation (min) (ACUTE ONLY): 9 min  Charges:  $Gait Training: 8-22 mins                    G Codes:       Danielle Dess, PTA 04/02/17, 9:39 AM

## 2017-04-02 NOTE — Progress Notes (Signed)
Patient is medically stable for D/C back to [facility] today. Per [place] staff member patient can return today and [facility] will [facility] her up at 4 pm. RN will call report to [place]. Clinical Education officer, museum (CSW) sent D/C orders including home health PT order to [facility]. Per Haynes Kerns RN home health will be arranged by [facility]. CSW contacted patient's guardian Letta Median and made her aware of above. CSW met with patient and made her aware of above. Please reconsult if future social work needs arise. CSW signing off.   [employer], LCSW [phone]

## 2017-04-02 NOTE — Discharge Summary (Signed)
[facility] -  at [facility]   PATIENT NAME: Hannah Vaughan    MR#:  [id number]  DATE OF BIRTH:  [birth year]  DATE OF ADMISSION:  03/31/2017   ADMITTING PHYSICIAN: Oralia Manisavid Willis, MD  DATE OF DISCHARGE:  04/02/2017  PRIMARY CARE PHYSICIAN: Lauro RegulusAnderson, Marshall W, MD   ADMISSION DIAGNOSIS:   Urinary tract infection with hematuria, site unspecified [N39.0, R31.9] Intractable vomiting with nausea, unspecified vomiting type [R11.2]  DISCHARGE DIAGNOSIS:   Principal Problem:   UTI (urinary tract infection) Active Problems:   HTN (hypertension)   Seizure disorder (HCC)   CAD (coronary artery disease)   GERD (gastroesophageal reflux disease)   SECONDARY DIAGNOSIS:   Past Medical History:  Diagnosis Date  . CAD (coronary artery disease)   . GERD (gastroesophageal reflux disease)   . History of hiatal hernia   . HTN (hypertension)   . Mental retardation   . Osteoarthritis   . Pneumococcus infection   . Seasonal allergies   . Seizure (HCC)   . Seizure [facility](HCC)     HOSPITAL COURSE:   76 year old female with past medical history significant for mental retardation from a group home, hiatal hernia with GERD, CAD and seizure disorder presents with intractable nausea and vomiting and noted to have acute cystitis.  #1 acute cystitis-urine cultures growing pansensitive Escherichia coli. -on Rocephin. Change to Ceftin at discharge -Gentle hydration received. Nausea and vomiting have resolved and tolerating a regular diet..  #2 seizure disorder-continue home medications-on phenytoin and phenobarbital  #3 hypertension-patient on metoprolol and Imdur.  #4 hypokalemia-was replaced.  #5 dysphagia-was having some swallowing difficulty. Added a trial of fluconazole with improvement. Already on Prilosec. On pured diet with nectar thick liquids  Physical therapy recommended rehabilitation, however patient is at her baseline. She ambulates with a walker with  some assistance and a gait belt. She will be discharged back to her group home.  DISCHARGE CONDITIONS:   Guarded  CONSULTS OBTAINED:   Speech therapy  DRUG ALLERGIES:   Allergies  Allergen Reactions  . Depakote [Valproic Acid]   . Divalproex Sodium Other (See Comments)   DISCHARGE MEDICATIONS:   Allergies as of 04/02/2017      Reactions   Depakote [valproic Acid]    Divalproex Sodium Other (See Comments)      Medication List    STOP taking these medications   cephALEXin 500 MG capsule Commonly known as:  KEFLEX   hydrochlorothiazide 25 MG tablet Commonly known as:  HYDRODIURIL   triamcinolone cream 0.1 % Commonly known as:  KENALOG     TAKE these medications   acetaminophen 325 MG tablet Commonly known as:  TYLENOL Take 1 tablet by mouth every 6 (six) hours.   aspirin EC 81 MG tablet Take 1 tablet by mouth daily.   CALCIUM 500/D 500-200 MG-UNIT tablet Generic drug:  calcium-vitamin D Take 1 tablet by mouth 2 (two) times daily.   carbamide peroxide 6.5 % OTIC solution Commonly known as:  DEBROX Place 5 drops into both ears every 14 (fourteen) days.   cefUROXime 500 MG tablet Commonly known as:  CEFTIN Take 1 tablet (500 mg total) by mouth 2 (two) times daily. X 8 more days   cetirizine 10 MG tablet Commonly known as:  ZYRTEC Take 1 tablet by mouth daily as needed.   cyanocobalamin 1000 MCG/ML injection Commonly known as:  (VITAMIN B-12) Inject 1 mL into the muscle every 30 (thirty) days.   eucerin cream Apply 1 application topically 2 (two)  times daily.   fluconazole 100 MG tablet Commonly known as:  DIFLUCAN Take 1 tablet (100 mg total) by mouth daily. X 4 more days   gabapentin 600 MG tablet Commonly known as:  NEURONTIN Take 1 tablet by mouth 4 (four) times daily.   guaiFENesin 100 MG/5ML Soln Commonly known as:  ROBITUSSIN Take 5 mLs by mouth every 4 (four) hours as needed for cough or to loosen phlegm.   hydrocortisone 25 MG  suppository Commonly known as:  ANUSOL-HC Place 25 mg rectally 2 (two) times daily as needed for hemorrhoids or itching.   hydrocortisone-pramoxine rectal foam Commonly known as:  PROCTOFOAM-HC Place 1 applicator rectally 2 (two) times daily.   iron polysaccharides 150 MG capsule Commonly known as:  NIFEREX Take 150 mg by mouth daily.   isosorbide mononitrate 60 MG 24 hr tablet Commonly known as:  IMDUR Take 1 tablet by mouth daily.   loperamide 2 MG capsule Commonly known as:  IMODIUM Take 2 mg by mouth as needed for diarrhea or loose stools.   LORazepam 0.5 MG tablet Commonly known as:  ATIVAN Take 0.5 mg by mouth every 8 (eight) hours.   magnesium hydroxide 400 MG/5ML suspension Commonly known as:  MILK OF MAGNESIA Take 5 mLs by mouth as needed.   MALDROXAL ANTACID PO Take by mouth as needed.   metoprolol succinate 25 MG 24 hr tablet Commonly known as:  TOPROL-XL Take 1 tablet by mouth daily.   miconazole 2 % powder Commonly known as:  MICOTIN Apply 1 application topically 2 (two) times daily.   mupirocin ointment 2 % Commonly known as:  BACTROBAN Place 1 application into the nose 3 (three) times daily.   nitroGLYCERIN 0.4 MG SL tablet Commonly known as:  NITROSTAT Place 1 tablet under the tongue as needed.   nystatin powder Generic drug:  nystatin Apply 1 g topically as needed.   omeprazole 20 MG capsule Commonly known as:  PRILOSEC Take 1 capsule by mouth daily.   ondansetron 4 MG disintegrating tablet Commonly known as:  ZOFRAN ODT Take 1 tablet (4 mg total) by mouth every 8 (eight) hours as needed for nausea or vomiting.   PHENObarbital 20 MG/5ML elixir Take 15 mLs by mouth at bedtime.   phenytoin 100 MG ER capsule Commonly known as:  DILANTIN Take 100 mg by mouth 3 (three) times daily.   STOOL SOFTENER 100 MG capsule Generic drug:  docusate sodium Take 1 capsule by mouth 2 (two) times daily.   terbinafine 1 % cream Commonly known as:   LAMISIL Apply 1 application topically 2 (two) times daily.   tolnaftate 1 % spray Commonly known as:  TINACTIN Apply 1 application topically 2 (two) times daily.   traMADol 50 MG tablet Commonly known as:  ULTRAM Take 1 tablet (50 mg total) by mouth every 6 (six) hours as needed.        DISCHARGE INSTRUCTIONS:   1. PCP follow-up in 1-2 weeks  DIET:   Pured diet with nectar thick liquids  ACTIVITY:   Activity as tolerated  OXYGEN:   Home Oxygen: No.  Oxygen Delivery: room air  DISCHARGE LOCATION:   group home   If you experience worsening of your admission symptoms, develop shortness of breath, life threatening emergency, suicidal or homicidal thoughts you must seek medical attention immediately by calling 911 or calling your MD immediately  if symptoms less severe.  You Must read complete instructions/literature along with all the possible adverse reactions/side effects for all  the Medicines you take and that have been prescribed to you. Take any new Medicines after you have completely understood and accpet all the possible adverse reactions/side effects.   Please note  You were cared for by a hospitalist during your hospital stay. If you have any questions about your discharge medications or the care you received while you were in the hospital after you are discharged, you can call the unit and asked to speak with the hospitalist on call if the hospitalist that took care of you is not available. Once you are discharged, your primary care physician will handle any further medical issues. Please note that NO REFILLS for any discharge medications will be authorized once you are discharged, as it is imperative that you return to your primary care physician (or establish a relationship with a primary care physician if you do not have one) for your aftercare needs so that they can reassess your need for medications and monitor your lab values.    On the day of Discharge:    VITAL SIGNS:   Blood pressure 127/62, pulse 81, temperature 98.4 F (36.9 C), temperature source Oral, resp. rate 18, height 4\' 11"  (1.499 m), weight 54 kg (119 lb), SpO2 92 %.  PHYSICAL EXAMINATION:    GENERAL:  76 y.o.-year-old patient lying in the bed with no acute distress.  EYES: Pupils equal, round, reactive to light and accommodation. No scleral icterus. Extraocular muscles intact.  HEENT: Head atraumatic, normocephalic. Oropharynx and nasopharynx clear. prottruding tongue noted. NECK:  Supple, no jugular venous distention. No thyroid enlargement, no tenderness.  LUNGS: Normal breath sounds bilaterally, no wheezing, rales,rhonchi or crepitation. No use of accessory muscles of respiration. Decreased bibasilar breath sounds CARDIOVASCULAR: S1, S2 normal. No murmurs, rubs, or gallops.  ABDOMEN: Soft, non-tender, non-distended. Bowel sounds present. No organomegaly or mass.  EXTREMITIES: No pedal edema, cyanosis, or clubbing.  NEUROLOGIC: Able to move all extremities in bed, speech is slurred secondary to her protruding tongue. Alert today. Trying to communicate.  PSYCHIATRIC: The patient is alert and at baseline  SKIN: No obvious rash, lesion, or ulcer.   DATA REVIEW:   CBC  Recent Labs Lab 04/01/17 0429  WBC 9.6  HGB 10.1*  HCT 31.6*  PLT 192    Chemistries   Recent Labs Lab 03/31/17 1313  04/02/17 0445  NA 142  < > 143  K 3.2*  < > 3.6  CL 102  < > 106  CO2 32  < > 30  GLUCOSE 125*  < > 104*  BUN 19  < > 25*  CREATININE 0.63  < > 0.68  CALCIUM 9.4  < > 8.5*  AST 20  --   --   ALT 11*  --   --   ALKPHOS 208*  --   --   BILITOT 0.5  --   --   < > = values in this interval not displayed.   Microbiology Results  Results for orders placed or performed during the hospital encounter of 03/31/17  MRSA PCR Screening     Status: None   Collection Time: 03/31/17 11:25 PM  Result Value Ref Range Status   MRSA by PCR NEGATIVE NEGATIVE Final    Comment:         The GeneXpert MRSA Assay (FDA approved for NASAL specimens only), is one component of a comprehensive MRSA colonization surveillance program. It is not intended to diagnose MRSA infection nor to guide or monitor treatment for MRSA infections.  RADIOLOGY:  No results found.   Management plans discussed with the patient, family and they are in agreement.  CODE STATUS:     Code Status Orders        Start     Ordered   03/31/17 2317  Full code  Continuous     03/31/17 2316    Code Status History    Date Active Date Inactive Code Status Order ID Comments User Context   11/26/2016  3:02 PM 11/28/2016  6:18 PM Full Code [id number]  Milagros Loll, MD ED   05/06/2015 11:52 PM 05/09/2015  8:04 PM Full Code [id number]  Oralia Manis, MD Inpatient      TOTAL TIME TAKING CARE OF THIS PATIENT: 37 minutes.    Enid Baas M.D on 04/02/2017 at 11:16 AM  Between 7am to 6pm - Pager - [phone]  After 6pm go to [email or web address] - Scientist, research (life sciences) [facility]  Office  [phone]  CC: Primary care physician; Lauro Regulus, MD   Note: This dictation was prepared with Dragon dictation along with smaller phrase technology. Any transcriptional errors that result from this process are unintentional.

## 2017-04-29 ENCOUNTER — Inpatient Hospital Stay: Payer: Medicare Other | Admitting: Anesthesiology

## 2017-04-29 ENCOUNTER — Emergency Department: Payer: Medicare Other

## 2017-04-29 ENCOUNTER — Encounter
Admission: EM | Disposition: A | Discharge status: Home / Self Care | Payer: Self-pay | Source: Home / Self Care | Attending: Surgery

## 2017-04-29 ENCOUNTER — Inpatient Hospital Stay
Admission: EM | Admit: 2017-04-29 | Discharge: 2017-05-09 | DRG: 335 | Disposition: A | Discharge status: Home / Self Care | Payer: Medicare Other | Attending: Surgery | Admitting: Surgery

## 2017-04-29 DIAGNOSIS — Z90710 Acquired absence of both cervix and uterus: Secondary | ICD-10-CM

## 2017-04-29 DIAGNOSIS — N390 Urinary tract infection, site not specified: Secondary | ICD-10-CM | POA: Diagnosis not present

## 2017-04-29 DIAGNOSIS — J9601 Acute respiratory failure with hypoxia: Secondary | ICD-10-CM

## 2017-04-29 DIAGNOSIS — Z79899 Other long term (current) drug therapy: Secondary | ICD-10-CM

## 2017-04-29 DIAGNOSIS — Z833 Family history of diabetes mellitus: Secondary | ICD-10-CM

## 2017-04-29 DIAGNOSIS — Z4659 Encounter for fitting and adjustment of other gastrointestinal appliance and device: Secondary | ICD-10-CM

## 2017-04-29 DIAGNOSIS — E876 Hypokalemia: Secondary | ICD-10-CM | POA: Diagnosis not present

## 2017-04-29 DIAGNOSIS — Z888 Allergy status to other drugs, medicaments and biological substances status: Secondary | ICD-10-CM | POA: Diagnosis not present

## 2017-04-29 DIAGNOSIS — Z978 Presence of other specified devices: Secondary | ICD-10-CM

## 2017-04-29 DIAGNOSIS — I9581 Postprocedural hypotension: Secondary | ICD-10-CM | POA: Diagnosis not present

## 2017-04-29 DIAGNOSIS — Z8249 Family history of ischemic heart disease and other diseases of the circulatory system: Secondary | ICD-10-CM

## 2017-04-29 DIAGNOSIS — I2510 Atherosclerotic heart disease of native coronary artery without angina pectoris: Secondary | ICD-10-CM | POA: Diagnosis present

## 2017-04-29 DIAGNOSIS — Z9049 Acquired absence of other specified parts of digestive tract: Secondary | ICD-10-CM

## 2017-04-29 DIAGNOSIS — J95821 Acute postprocedural respiratory failure: Secondary | ICD-10-CM | POA: Diagnosis not present

## 2017-04-29 DIAGNOSIS — D649 Anemia, unspecified: Secondary | ICD-10-CM | POA: Diagnosis present

## 2017-04-29 DIAGNOSIS — R112 Nausea with vomiting, unspecified: Secondary | ICD-10-CM | POA: Diagnosis present

## 2017-04-29 DIAGNOSIS — I472 Ventricular tachycardia: Secondary | ICD-10-CM | POA: Diagnosis not present

## 2017-04-29 DIAGNOSIS — J690 Pneumonitis due to inhalation of food and vomit: Secondary | ICD-10-CM | POA: Diagnosis not present

## 2017-04-29 DIAGNOSIS — M1990 Unspecified osteoarthritis, unspecified site: Secondary | ICD-10-CM | POA: Diagnosis present

## 2017-04-29 DIAGNOSIS — K567 Ileus, unspecified: Secondary | ICD-10-CM | POA: Diagnosis not present

## 2017-04-29 DIAGNOSIS — N179 Acute kidney failure, unspecified: Secondary | ICD-10-CM | POA: Diagnosis present

## 2017-04-29 DIAGNOSIS — K565 Intestinal adhesions [bands], unspecified as to partial versus complete obstruction: Principal | ICD-10-CM | POA: Diagnosis present

## 2017-04-29 DIAGNOSIS — K210 Gastro-esophageal reflux disease with esophagitis: Secondary | ICD-10-CM | POA: Diagnosis present

## 2017-04-29 DIAGNOSIS — I10 Essential (primary) hypertension: Secondary | ICD-10-CM | POA: Diagnosis present

## 2017-04-29 DIAGNOSIS — F72 Severe intellectual disabilities: Secondary | ICD-10-CM | POA: Diagnosis present

## 2017-04-29 DIAGNOSIS — R531 Weakness: Secondary | ICD-10-CM

## 2017-04-29 DIAGNOSIS — R600 Localized edema: Secondary | ICD-10-CM

## 2017-04-29 DIAGNOSIS — J90 Pleural effusion, not elsewhere classified: Secondary | ICD-10-CM | POA: Diagnosis present

## 2017-04-29 DIAGNOSIS — G40909 Epilepsy, unspecified, not intractable, without status epilepticus: Secondary | ICD-10-CM

## 2017-04-29 DIAGNOSIS — K56609 Unspecified intestinal obstruction, unspecified as to partial versus complete obstruction: Secondary | ICD-10-CM | POA: Diagnosis not present

## 2017-04-29 DIAGNOSIS — I252 Old myocardial infarction: Secondary | ICD-10-CM | POA: Diagnosis not present

## 2017-04-29 DIAGNOSIS — K9189 Other postprocedural complications and disorders of digestive system: Secondary | ICD-10-CM | POA: Diagnosis not present

## 2017-04-29 DIAGNOSIS — Z7982 Long term (current) use of aspirin: Secondary | ICD-10-CM

## 2017-04-29 DIAGNOSIS — K219 Gastro-esophageal reflux disease without esophagitis: Secondary | ICD-10-CM | POA: Diagnosis present

## 2017-04-29 HISTORY — PX: LAPAROTOMY: SHX154

## 2017-04-29 LAB — URINALYSIS, ROUTINE W REFLEX MICROSCOPIC
Bilirubin Urine: NEGATIVE
GLUCOSE, UA: NEGATIVE mg/dL
Ketones, ur: NEGATIVE mg/dL
NITRITE: NEGATIVE
Protein, ur: 100 mg/dL — AB
SPECIFIC GRAVITY, URINE: 1.018 (ref 1.005–1.030)
Squamous Epithelial / LPF: NONE SEEN
pH: 6 (ref 5.0–8.0)

## 2017-04-29 LAB — CBC
HCT: 37.8 % (ref 35.0–47.0)
Hemoglobin: 11.7 g/dL — ABNORMAL LOW (ref 12.0–16.0)
MCH: 24 pg — ABNORMAL LOW (ref 26.0–34.0)
MCHC: 31.1 g/dL — AB (ref 32.0–36.0)
MCV: 77.3 fL — ABNORMAL LOW (ref 80.0–100.0)
Platelets: 314 10*3/uL (ref 150–440)
RBC: 4.89 MIL/uL (ref 3.80–5.20)
RDW: 20.8 % — AB (ref 11.5–14.5)
WBC: 20.1 10*3/uL — ABNORMAL HIGH (ref 3.6–11.0)

## 2017-04-29 LAB — COMPREHENSIVE METABOLIC PANEL
ALK PHOS: 141 U/L — AB (ref 38–126)
ALT: 25 U/L (ref 14–54)
AST: 24 U/L (ref 15–41)
Albumin: 3.2 g/dL — ABNORMAL LOW (ref 3.5–5.0)
Anion gap: 13 (ref 5–15)
BUN: 47 mg/dL — AB (ref 6–20)
CALCIUM: 9.7 mg/dL (ref 8.9–10.3)
CO2: 35 mmol/L — AB (ref 22–32)
CREATININE: 1.1 mg/dL — AB (ref 0.44–1.00)
Chloride: 95 mmol/L — ABNORMAL LOW (ref 101–111)
GFR calc non Af Amer: 48 mL/min — ABNORMAL LOW (ref >60)
GFR, EST AFRICAN AMERICAN: 56 mL/min — AB (ref >60)
Glucose, Bld: 143 mg/dL — ABNORMAL HIGH (ref 65–99)
Potassium: 3.1 mmol/L — ABNORMAL LOW (ref 3.5–5.1)
SODIUM: 143 mmol/L (ref 135–145)
Total Bilirubin: 0.9 mg/dL (ref 0.3–1.2)
Total Protein: 6.9 g/dL (ref 6.5–8.1)

## 2017-04-29 LAB — TROPONIN I: Troponin I: 0.09 ng/mL (ref <0.03)

## 2017-04-29 SURGERY — LAPAROTOMY, EXPLORATORY
Anesthesia: General | Wound class: Clean

## 2017-04-29 MED ORDER — KETOROLAC TROMETHAMINE 15 MG/ML IJ SOLN
15.0000 mg | Freq: Four times a day (QID) | INTRAMUSCULAR | Status: AC
  Administered 2017-04-30 – 2017-05-04 (×18): 15 mg via INTRAVENOUS
  Filled 2017-04-29 (×20): qty 1

## 2017-04-29 MED ORDER — HEPARIN SODIUM (PORCINE) 5000 UNIT/ML IJ SOLN
5000.0000 [IU] | Freq: Three times a day (TID) | INTRAMUSCULAR | Status: DC
  Administered 2017-04-30 – 2017-05-02 (×8): 5000 [IU] via SUBCUTANEOUS
  Filled 2017-04-29 (×8): qty 1

## 2017-04-29 MED ORDER — FENTANYL CITRATE (PF) 100 MCG/2ML IJ SOLN
INTRAMUSCULAR | Status: DC | PRN
  Administered 2017-04-29 (×5): 50 ug via INTRAVENOUS

## 2017-04-29 MED ORDER — ROCURONIUM BROMIDE 50 MG/5ML IV SOLN
INTRAVENOUS | Status: AC
  Filled 2017-04-29: qty 1

## 2017-04-29 MED ORDER — DEXTROSE 5 % IV SOLN
1.0000 g | Freq: Once | INTRAVENOUS | Status: AC
  Administered 2017-04-29: 1 g via INTRAVENOUS
  Filled 2017-04-29: qty 10

## 2017-04-29 MED ORDER — SUCCINYLCHOLINE CHLORIDE 20 MG/ML IJ SOLN
INTRAMUSCULAR | Status: AC
  Filled 2017-04-29: qty 1

## 2017-04-29 MED ORDER — ONDANSETRON 4 MG PO TBDP
4.0000 mg | ORAL_TABLET | Freq: Four times a day (QID) | ORAL | Status: DC | PRN
  Filled 2017-04-29: qty 1

## 2017-04-29 MED ORDER — PIPERACILLIN-TAZOBACTAM 3.375 G IVPB 30 MIN
3.3750 g | Freq: Once | INTRAVENOUS | Status: AC
  Administered 2017-04-29: 3.375 g via INTRAVENOUS

## 2017-04-29 MED ORDER — ROCURONIUM BROMIDE 100 MG/10ML IV SOLN
INTRAVENOUS | Status: DC | PRN
  Administered 2017-04-29 (×2): 50 mg via INTRAVENOUS

## 2017-04-29 MED ORDER — FENTANYL CITRATE (PF) 250 MCG/5ML IJ SOLN
INTRAMUSCULAR | Status: AC
  Filled 2017-04-29: qty 5

## 2017-04-29 MED ORDER — IOPAMIDOL (ISOVUE-300) INJECTION 61%: 15.0000 mL | INTRAVENOUS | Status: AC

## 2017-04-29 MED ORDER — LACTATED RINGERS IV SOLN
125.0000 mL/h | INTRAVENOUS | Status: DC
  Administered 2017-04-30 – 2017-05-03 (×11): 125 mL/h via INTRAVENOUS

## 2017-04-29 MED ORDER — HYDROMORPHONE HCL 1 MG/ML IJ SOLN: 0.5000 mg | INTRAMUSCULAR | Status: DC | PRN

## 2017-04-29 MED ORDER — PANTOPRAZOLE SODIUM 40 MG IV SOLR
40.0000 mg | Freq: Every day | INTRAVENOUS | Status: DC
  Administered 2017-04-30 – 2017-05-04 (×6): 40 mg via INTRAVENOUS
  Filled 2017-04-29 (×6): qty 40

## 2017-04-29 MED ORDER — PROPOFOL 10 MG/ML IV BOLUS
INTRAVENOUS | Status: AC
  Filled 2017-04-29: qty 20

## 2017-04-29 MED ORDER — DEXAMETHASONE SODIUM PHOSPHATE 10 MG/ML IJ SOLN
INTRAMUSCULAR | Status: AC
  Filled 2017-04-29: qty 1

## 2017-04-29 MED ORDER — LACTATED RINGERS IV SOLN
INTRAVENOUS | Status: DC | PRN
  Administered 2017-04-29: 22:00:00 via INTRAVENOUS

## 2017-04-29 MED ORDER — BUPIVACAINE LIPOSOME 1.3 % IJ SUSP
INTRAMUSCULAR | Status: AC
  Filled 2017-04-29: qty 20

## 2017-04-29 MED ORDER — SODIUM CHLORIDE 0.9 % IJ SOLN
INTRAMUSCULAR | Status: AC
  Filled 2017-04-29: qty 10

## 2017-04-29 MED ORDER — PHENYLEPHRINE HCL 10 MG/ML IJ SOLN
INTRAMUSCULAR | Status: DC | PRN
  Administered 2017-04-29 (×4): 100 ug via INTRAVENOUS

## 2017-04-29 MED ORDER — ONDANSETRON HCL 4 MG/2ML IJ SOLN
4.0000 mg | Freq: Four times a day (QID) | INTRAMUSCULAR | Status: DC | PRN
  Administered 2017-04-29: 4 mg via INTRAVENOUS

## 2017-04-29 MED ORDER — METOPROLOL TARTRATE 5 MG/5ML IV SOLN: 5.0000 mg | Freq: Four times a day (QID) | INTRAVENOUS | Status: DC

## 2017-04-29 MED ORDER — SODIUM CHLORIDE 0.9 % IV SOLN
INTRAVENOUS | Status: DC | PRN
  Administered 2017-04-29: 15 ug/min via INTRAVENOUS

## 2017-04-29 MED ORDER — PHENYTOIN SODIUM 50 MG/ML IJ SOLN
100.0000 mg | Freq: Three times a day (TID) | INTRAMUSCULAR | Status: DC
  Administered 2017-04-30 (×2): 100 mg via INTRAVENOUS
  Filled 2017-04-29 (×5): qty 2

## 2017-04-29 MED ORDER — IOPAMIDOL (ISOVUE-300) INJECTION 61%
100.0000 mL | Freq: Once | INTRAVENOUS | Status: AC | PRN
  Administered 2017-04-29: 100 mL via INTRAVENOUS

## 2017-04-29 MED ORDER — PIPERACILLIN-TAZOBACTAM 3.375 G IVPB 30 MIN
INTRAVENOUS | Status: AC
  Administered 2017-04-29: 3.375 g via INTRAVENOUS
  Filled 2017-04-29: qty 50

## 2017-04-29 MED ORDER — BUPIVACAINE-EPINEPHRINE (PF) 0.5% -1:200000 IJ SOLN
INTRAMUSCULAR | Status: AC
  Filled 2017-04-29: qty 30

## 2017-04-29 MED ORDER — ONDANSETRON HCL 4 MG/2ML IJ SOLN
INTRAMUSCULAR | Status: AC
  Filled 2017-04-29: qty 2

## 2017-04-29 MED ORDER — MIDAZOLAM HCL 2 MG/2ML IJ SOLN
INTRAMUSCULAR | Status: AC
  Filled 2017-04-29: qty 2

## 2017-04-29 MED ORDER — PHENOBARBITAL SODIUM 65 MG/ML IJ SOLN
65.0000 mg | Freq: Every day | INTRAMUSCULAR | Status: DC
  Administered 2017-04-30 (×2): 65 mg via INTRAVENOUS
  Filled 2017-04-29 (×3): qty 1

## 2017-04-29 MED ORDER — LIDOCAINE HCL (PF) 2 % IJ SOLN
INTRAMUSCULAR | Status: AC
  Filled 2017-04-29: qty 2

## 2017-04-29 MED ORDER — MIDAZOLAM HCL 2 MG/2ML IJ SOLN
INTRAMUSCULAR | Status: DC | PRN
  Administered 2017-04-29 (×2): 1 mg via INTRAVENOUS

## 2017-04-29 MED ORDER — EPHEDRINE SULFATE 50 MG/ML IJ SOLN
INTRAMUSCULAR | Status: DC | PRN
  Administered 2017-04-29: 5 mg via INTRAVENOUS

## 2017-04-29 MED ORDER — PIPERACILLIN-TAZOBACTAM 3.375 G IVPB
3.3750 g | Freq: Three times a day (TID) | INTRAVENOUS | Status: DC
  Administered 2017-04-30 (×2): 3.375 g via INTRAVENOUS
  Filled 2017-04-29 (×5): qty 50

## 2017-04-29 MED ORDER — BUPIVACAINE-EPINEPHRINE 0.5% -1:200000 IJ SOLN
INTRAMUSCULAR | Status: DC | PRN
  Administered 2017-04-29: 30 mL

## 2017-04-29 MED ORDER — SUCCINYLCHOLINE CHLORIDE 20 MG/ML IJ SOLN
INTRAMUSCULAR | Status: DC | PRN
  Administered 2017-04-29: 100 mg via INTRAVENOUS

## 2017-04-29 MED ORDER — HYDRALAZINE HCL 20 MG/ML IJ SOLN
10.0000 mg | Freq: Four times a day (QID) | INTRAMUSCULAR | Status: DC | PRN
  Filled 2017-04-29: qty 1

## 2017-04-29 MED ORDER — ONDANSETRON HCL 4 MG/2ML IJ SOLN
INTRAMUSCULAR | Status: DC | PRN
  Administered 2017-04-29: 4 mg via INTRAVENOUS

## 2017-04-29 MED ORDER — DEXAMETHASONE SODIUM PHOSPHATE 10 MG/ML IJ SOLN
INTRAMUSCULAR | Status: DC | PRN
  Administered 2017-04-29: 5 mg via INTRAVENOUS

## 2017-04-29 MED ORDER — BUPIVACAINE LIPOSOME 1.3 % IJ SUSP
INTRAMUSCULAR | Status: DC | PRN
  Administered 2017-04-29: 20 mL

## 2017-04-29 MED ORDER — PROPOFOL 10 MG/ML IV BOLUS
INTRAVENOUS | Status: DC | PRN
  Administered 2017-04-29: 100 mg via INTRAVENOUS

## 2017-04-29 MED ORDER — SODIUM CHLORIDE 0.9 % IJ SOLN
INTRAMUSCULAR | Status: DC | PRN
  Administered 2017-04-29: 10 mL

## 2017-04-29 MED ORDER — SODIUM CHLORIDE 0.9 % IV SOLN
INTRAVENOUS | Status: DC | PRN
  Administered 2017-04-29 (×2): via INTRAVENOUS

## 2017-04-29 SURGICAL SUPPLY — 39 items
BRR ADH 6X5 SEPRAFILM 1 SHT (MISCELLANEOUS)
CANISTER SUCT 1200ML W/VALVE (MISCELLANEOUS) ×5 IMPLANT
CANISTER SUCT 3000ML PPV (MISCELLANEOUS) ×3 IMPLANT
CHLORAPREP W/TINT 10.5 ML (MISCELLANEOUS) ×3 IMPLANT
DECANTER SPIKE VIAL GLASS SM (MISCELLANEOUS) ×2 IMPLANT
DRAPE LAPAROTOMY T 102X78X121 (DRAPES) ×3 IMPLANT
DRSG OPSITE POSTOP 4X12 (GAUZE/BANDAGES/DRESSINGS) ×1 IMPLANT
DRSG OPSITE POSTOP 4X8 (GAUZE/BANDAGES/DRESSINGS) ×3 IMPLANT
DRSG TEGADERM 4X10 (GAUZE/BANDAGES/DRESSINGS) IMPLANT
ELECT CAUTERY BLADE 6.4 (BLADE) ×1 IMPLANT
ELECT REM PT RETURN 9FT ADLT (ELECTROSURGICAL) ×3
ELECTRODE REM PT RTRN 9FT ADLT (ELECTROSURGICAL) ×1 IMPLANT
GAUZE SPONGE 4X4 12PLY STRL (GAUZE/BANDAGES/DRESSINGS) ×1 IMPLANT
GLOVE SURG SYN 7.0 (GLOVE) ×6 IMPLANT
GLOVE SURG SYN 7.0 PF PI (GLOVE) ×2 IMPLANT
GLOVE SURG SYN 7.5  E (GLOVE) ×4
GLOVE SURG SYN 7.5 E (GLOVE) ×2 IMPLANT
GLOVE SURG SYN 7.5 PF PI (GLOVE) ×2 IMPLANT
GOWN STRL REUS W/ TWL LRG LVL3 (GOWN DISPOSABLE) ×4 IMPLANT
GOWN STRL REUS W/TWL LRG LVL3 (GOWN DISPOSABLE) ×6
LABEL OR SOLS (LABEL) ×1 IMPLANT
LIGASURE IMPACT 36 18CM CVD LR (INSTRUMENTS) ×1 IMPLANT
NDL SAFETY 22GX1.5 (NEEDLE) ×1 IMPLANT
NEEDLE HYPO 22GX1.5 SAFETY (NEEDLE) ×2 IMPLANT
NS IRRIG 1000ML POUR BTL (IV SOLUTION) ×7 IMPLANT
PACK BASIN MAJOR ARMC (MISCELLANEOUS) ×3 IMPLANT
PACK COLON CLEAN CLOSURE (MISCELLANEOUS) IMPLANT
RELOAD STAPLE SKIN SM 35W (MISCELLANEOUS) ×1 IMPLANT
SEPRAFILM MEMBRANE 5X6 (MISCELLANEOUS) ×1 IMPLANT
SUT PDS AB 1 TP1 96 (SUTURE) ×6 IMPLANT
SUT PROLENE 0 CT 1 30 (SUTURE) IMPLANT
SUT SILK 2 0 (SUTURE)
SUT SILK 2-0 18XBRD TIE 12 (SUTURE) ×1 IMPLANT
SUT SILK 3-0 (SUTURE) ×1 IMPLANT
SUT VIC AB 3-0 SH 27 (SUTURE)
SUT VIC AB 3-0 SH 27X BRD (SUTURE) ×1 IMPLANT
SYR 30ML LL (SYRINGE) ×4 IMPLANT
SYRINGE 10CC LL (SYRINGE) ×1 IMPLANT
TRAY FOLEY W/METER SILVER 16FR (SET/KITS/TRAYS/PACK) ×3 IMPLANT

## 2017-04-29 NOTE — Consult Note (Signed)
PULMONARY / CRITICAL CARE MEDICINE   Name: Hannah Vaughan MRN: [id number] DOB: 04/14/1941    ADMISSION DATE:  04/29/2017 CONSULTATION DATE: 04/29/2017  REFERRING MD:  Dr. Aleen Campi  CHIEF COMPLAINT: Nausea and Vomiting   HISTORY OF PRESENT ILLNESS:   This is a 76 year old female with a past medical history of seizures, pneumonia, osteoarthritis, mental retardation, hypertension, hiatal hernia, GERD, and CAD.  She presented to [facility] ER 08/13 from a group home with nausea, vomiting, abdominal pain, and urinary retention onset of symptoms 08/10. Today 08/13 she was noted to be lethargic with decreased O2 sats and a low-grade temperature, therefore she was brought to the hospital for further evaluation. In the ER lab results revealed wbc 20.1, creatinine 1.1, troponin 0.09 ekg no evidence of ischemia, and UA revealing a UTI.  She had a CT Abd Pelvis 08/13 results concerning for a closed loop obstruction of small bowel in the pelvis with associated free fluid concerning for possible bowel ischemia, therefore she was transported to the OR for an exploratory laparotomy. Surgical findings were closed loop small bowel obstruction due to adhesions, therefore lysis of the adhesions was performed. The pt remained mechanically intubated postop, therefore admitted to ICU PCCM consulted for vent management.   PAST MEDICAL HISTORY :  She  has a past medical history of CAD (coronary artery disease); GERD (gastroesophageal reflux disease); History of hiatal hernia; HTN (hypertension); Mental retardation; Osteoarthritis; Pneumococcus infection; Seasonal allergies; Seizure (HCC); and Seizure (HCC).  PAST SURGICAL HISTORY: She  has a past surgical history that includes Cholecystectomy.  Allergies  Allergen Reactions  . Depakote [Valproic Acid]   . Divalproex Sodium Other (See Comments)    No current facility-administered medications on file prior to encounter.    Current Outpatient Prescriptions on File Prior to  Encounter  Medication Sig  . acetaminophen (TYLENOL) 325 MG tablet Take 1 tablet by mouth every 6 (six) hours.  . Aluminum & Magnesium Hydroxide (MALDROXAL ANTACID PO) Take by mouth as needed.  Marland Kitchen aspirin EC 81 MG tablet Take 1 tablet by mouth daily.  . calcium-vitamin D (CALCIUM 500/D) 500-200 MG-UNIT per tablet Take 1 tablet by mouth 2 (two) times daily.   . cetirizine (ZYRTEC) 10 MG tablet Take 1 tablet by mouth daily as needed.  . cyanocobalamin (,VITAMIN B-12,) 1000 MCG/ML injection Inject 1 mL into the muscle every 30 (thirty) days.  Marland Kitchen docusate sodium (STOOL SOFTENER) 100 MG capsule Take 1 capsule by mouth 2 (two) times daily.  Marland Kitchen gabapentin (NEURONTIN) 600 MG tablet Take 1 tablet by mouth 4 (four) times daily.   Marland Kitchen guaiFENesin (ROBITUSSIN) 100 MG/5ML SOLN Take 5 mLs by mouth every 4 (four) hours as needed for cough or to loosen phlegm.  . hydrocortisone (ANUSOL-HC) 25 MG suppository Place 25 mg rectally 2 (two) times daily as needed for hemorrhoids or itching.  . hydrocortisone-pramoxine (PROCTOFOAM-HC) rectal foam Place 1 applicator rectally 2 (two) times daily.  . iron polysaccharides (NIFEREX) 150 MG capsule Take 150 mg by mouth daily.  . isosorbide mononitrate (IMDUR) 60 MG 24 hr tablet Take 1 tablet by mouth daily.  Marland Kitchen loperamide (IMODIUM) 2 MG capsule Take 2 mg by mouth as needed for diarrhea or loose stools.  Marland Kitchen LORazepam (ATIVAN) 0.5 MG tablet Take 0.5 mg by mouth every 8 (eight) hours.  . magnesium hydroxide (MILK OF MAGNESIA) 400 MG/5ML suspension Take 5 mLs by mouth as needed.  . metoprolol succinate (TOPROL-XL) 25 MG 24 hr tablet Take 1 tablet by mouth daily.  Marland Kitchen  miconazole (MICOTIN) 2 % powder Apply 1 application topically 2 (two) times daily.  . mupirocin ointment (BACTROBAN) 2 % Place 1 application into the nose 3 (three) times daily.  . nitroGLYCERIN (NITROSTAT) 0.4 MG SL tablet Place 1 tablet under the tongue as needed.  . nystatin (NYSTATIN) powder Apply 1 g topically as  needed.  Marland Kitchen omeprazole (PRILOSEC) 20 MG capsule Take 1 capsule by mouth daily.  . ondansetron (ZOFRAN ODT) 4 MG disintegrating tablet Take 1 tablet (4 mg total) by mouth every 8 (eight) hours as needed for nausea or vomiting.  Marland Kitchen PHENObarbital 20 MG/5ML elixir Take 15 mLs by mouth at bedtime.  . phenytoin (DILANTIN) 100 MG ER capsule Take 100 mg by mouth 3 (three) times daily.  . Skin Protectants, Misc. (EUCERIN) cream Apply 1 application topically 2 (two) times daily.   Marland Kitchen terbinafine (LAMISIL) 1 % cream Apply 1 application topically 2 (two) times daily.  Marland Kitchen tolnaftate (TINACTIN) 1 % spray Apply 1 application topically 2 (two) times daily.  . traMADol (ULTRAM) 50 MG tablet Take 1 tablet (50 mg total) by mouth every 6 (six) hours as needed.  . carbamide peroxide (DEBROX) 6.5 % otic solution Place 5 drops into both ears every 14 (fourteen) days.   . fluconazole (DIFLUCAN) 100 MG tablet Take 1 tablet (100 mg total) by mouth daily. X 4 more days (Patient not taking: Reported on 04/29/2017)    FAMILY HISTORY:  Her indicated that the status of her mother is unknown. She indicated that the status of her father is unknown. She indicated that the status of her brother is unknown.    SOCIAL HISTORY: She  reports that she has never smoked. She has never used smokeless tobacco. She reports that she does not drink alcohol or use drugs.  REVIEW OF SYSTEMS:   Unable to assess pt mechanically intubated   SUBJECTIVE:  Unable to assess pt mechanically intubated  VITAL SIGNS: BP 109/62   Pulse 85   Temp 97.8 F (36.6 C) (Oral)   Resp (!) 23   Ht 4\' 11"  (1.499 m)   Wt 54 kg (119 lb)   SpO2 97%   BMI 24.04 kg/m   HEMODYNAMICS:    VENTILATOR SETTINGS:    INTAKE / OUTPUT: No intake/output data recorded.  PHYSICAL EXAMINATION: General: acutely ill appearing Caucasian female, NAD mechanically intubated  Neuro: sedated not following commands, PERRL HEENT: supple, no JVD Cardiovascular: nsr,  s1s2, rrr, no M/R/G Lungs: rhonchi with expiratory wheezes throughout, even, non labored Abdomen: no audible bowel sounds, soft, non distended Musculoskeletal: normal bulk and tone, no edema Skin: midline abdominal incision well approximated, honeycomb dressing dry and intact  LABS:  BMET  Recent Labs Lab 04/29/17 1843  NA 143  K 3.1*  CL 95*  CO2 35*  BUN 47*  CREATININE 1.10*  GLUCOSE 143*    Electrolytes  Recent Labs Lab 04/29/17 1843  CALCIUM 9.7    CBC  Recent Labs Lab 04/29/17 1810  WBC 20.1*  HGB 11.7*  HCT 37.8  PLT 314    Coag's No results for input(s): APTT, INR in the last 168 hours.  Sepsis Markers No results for input(s): LATICACIDVEN, PROCALCITON, O2SATVEN in the last 168 hours.  ABG No results for input(s): PHART, PCO2ART, PO2ART in the last 168 hours.  Liver Enzymes  Recent Labs Lab 04/29/17 1843  AST 24  ALT 25  ALKPHOS 141*  BILITOT 0.9  ALBUMIN 3.2*    Cardiac Enzymes  Recent Labs Lab  04/29/17 1843  TROPONINI 0.09*    Glucose No results for input(s): GLUCAP in the last 168 hours.  Imaging Ct Abdomen Pelvis W Contrast  Result Date: 04/29/2017 CLINICAL DATA:  Aspiration. Follow-up dilated small bowel seen on chest radiograph. EXAM: CT ABDOMEN AND PELVIS WITH CONTRAST TECHNIQUE: Multidetector CT imaging of the abdomen and pelvis was performed using the standard protocol following bolus administration of intravenous contrast. CONTRAST:  ISOVUE-300 IOPAMIDOL (ISOVUE-300) INJECTION 61% COMPARISON:  CT abdomen and pelvis March 31, 2017 and chest radiograph April 29, 2017 at 1752 hours FINDINGS: LOWER CHEST: New small pleural effusions. Old RIGHT posterior rib fractures. Heart is moderately enlarged and unchanged. 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 No pericardial effusion. Again noted is distal esophageal wall thickening and edema. 9 mm enhancing para esophageal lymph node. HEPATOBILIARY: Status post cholecystectomy.  Mild postprocedural intrahepatic biliary dilatation. PANCREAS: Normal. SPLEEN: Normal. ADRENALS/URINARY TRACT: Kidneys are orthotopic, demonstrating symmetric enhancement. No nephrolithiasis, hydronephrosis or solid renal masses. The unopacified ureters are normal in course and caliber. Delayed imaging through the kidneys demonstrates symmetric prompt contrast excretion within the proximal urinary collecting system. Urinary bladder is decompressed with disproportionate wall thickening and pericystic inflammation. Normal adrenal glands. STOMACH/BOWEL: Fluid distended stomach. Fluid felt distended small bowel at 3.7 cm. Central pelvis there are 2 transition points involving single loop of small bowel with pointed appearance of the mesentery and, surrounding free fluid. Mild colonic diverticulosis and decompressed colon. VASCULAR/LYMPHATIC: Aortoiliac vessels are normal in course and caliber. Moderate to severe atherosclerosis. No lymphadenopathy by CT size criteria. REPRODUCTIVE: Status post hysterectomy. OTHER: Small amount of ascites. MUSCULOSKELETAL: Nonacute. Grade 1 L5-S1 anterolisthesis on the basis of chronic L5 pars interarticularis defects. IMPRESSION: 1. High-grade small bowel obstruction at the level of the pelvis highly concerning for internal hernia resulting in closed loop obstruction. Small amount of reactive ascites. 2. Worsening distal esophagitis. 3. 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 Recommend correlation with urinary analysis. 4. New small pleural effusions. Stable cardiomegaly. 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 5. Acute findings discussed with and reconfirmed by Dr.KEVIN PADUCHOWSKI on 04/29/2017 at 8:10 pm. Aortic Atherosclerosis (ICD10-I70.0). Electronically Signed   By: Awilda Metro M.D.   On: 04/29/2017 20:13   Dg Chest Portable 1 View  Result Date: 04/29/2017 CLINICAL DATA:  Aspiration. EXAM: PORTABLE CHEST 1 VIEW COMPARISON:  Chest x-ray November 26, 2016. FINDINGS: Cardiomegaly,  unchanged. Left ventricular wall calcification again noted. Small left pleural effusion and adjacent left lower lobe opacity. Right lung is clear. No pneumothorax. No acute osseous abnormality. Old right-sided rib fractures. Partially visualized dilated loops of small bowel. Air-fluid level in the stomach. Status post cholecystectomy. IMPRESSION: 1. Stable cardiomegaly and small left pleural effusion. Left basilar opacities are favored to represent atelectasis, given similar appearance to prior study, although aspiration or pneumonia cannot be entirely excluded. 2. Partially visualized dilated loops of small bowel. Recommend further evaluation with CT abdomen and pelvis. Electronically Signed   By: Obie Dredge M.D.   On: 04/29/2017 18:26   STUDIES:  CT Abd Pelvis 08/13>>High-grade small bowel obstruction at the level of the pelvishighly concerning for internal hernia resulting in closed loop obstruction. Small amount of reactive ascites. Worsening distal esophagitis. 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 Recommend correlation with urinary analysis. New small pleural effusions. Stable cardiomegaly. 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  CULTURES: Urine 08/13>>  ANTIBIOTICS: Ceftriaxone x1 dose 08/13 Zosyn 08/14>>  SIGNIFICANT EVENTS: 08/13-Pt admitted to ICU s/p exploratory laparotomy   LINES/TUBES: ETT 08/13>>  ASSESSMENT / PLAN:  PULMONARY A:  Mechanical Intubation s/p exploratory laparotomy  Aspiration pneumonia  P:   Full vent support for now wean as tolerated  Will add scheduled and prn bronchodilator therapy Stat chest x-ray and ABG VAP bundle Continue antibiotics as listed above  CARDIOVASCULAR A:  Postop hypotension Mildly elevated troponin likely demand ischemia  Hx: CAD, HTN, and NSTEMI P:  Continuous telemetry monitoring Trend troponin's Maintain map >65 Hold outpatient hydrochlorothiazide and metoprolol  Cardiology consulted appreciate input   RENAL A:    Acute renal failure  Hypokalemia  Urinary retention  P:   Trend BMP  Replace electrolytes as indicated Monitor uop  Avoid nephrotoxic medications   GASTROINTESTINAL A:   Closed loop obstruction of small bowel s/p exploratory laparotomy-04/29/17 Hx: GERD and Hiatal Hernia  P:   Surgical team consulted appreciate input Keep NPO for now will defer to surgery NG tube LIS   HEMATOLOGIC A:   Anemia  P:  Subq heparin for VTE prophylaxis Trend CBC Monitor for s/sx of bleeding   INFECTIOUS A:   UTI Aspiration Pneumonia  Leukocytosis  P:   Trend WBC and monitor fever curve  Trend PCT and lactic acid Follow urine culture  Continue abx as listed above  Aspiration precautions   ENDOCRINE A:   Hyperglycemia  P:   CBG's q4hr and SSI   NEUROLOGIC A:   Mechanical Intubation Postop pain  Hx: Seizures and Mental Retardation  P:   RASS goal: 0 to -1 Prn fentanyl and versed to maintain RASS goal  Prn dilaudid and toradol for pain management Continue outpatient phenobarbital and dilantin  WUA daily     FAMILY  - Updates: No family currently at bedside to update 04/30/17  - Inter-disciplinary family meet or Palliative Care meeting due by: 05/07/2017    Sonda Rumbleana Inez Stantz, Juel BurrowAGNP  Pulmonary/Critical Care Pager [phone] (please enter 7 digits) PCCM Consult Pager [phone] (please enter 7 digits)

## 2017-04-29 NOTE — ED Notes (Addendum)
Report to [place], RN-OR [id number]  Awaiting guardian arrival for signature for consent

## 2017-04-29 NOTE — ED Provider Notes (Addendum)
[facility] Emergency Department Provider Note  Time seen: 6:03 PM  I have reviewed the triage vital signs and the nursing notes.   HISTORY  Chief Complaint Abnormal Lab and Urinary Retention    HPI Hannah Vaughan is a 76 y.o. female With a past medical history of gastric reflux, hypertension, lives at a group home, presents to the emergency departmentwith concerns of possible urinaryretention and an elevated white blood cell count. According to the caregivers they [facility] and they were called with an elevated white blood cell count told him to go to the emergency department. They also state the patient had not urinated all day and they were concerned about possible urinary retention patient was given 500 mg of Rocephin in the office today via injection. Caregiver stated baseline the patient can communicate but it is difficult to understand, states she normally ambulates with a walker. States for the past 2-3 days she has been much more weak than normal. States she vomited yesterday but denies any today. Denies any diarrhea.  Past Medical History:  Diagnosis Date  . CAD (coronary artery disease)   . GERD (gastroesophageal reflux disease)   . History of hiatal hernia   . HTN (hypertension)   . Mental retardation   . Osteoarthritis   . Pneumococcus infection   . Seasonal allergies   . Seizure (HCC)   . Seizure [facility](HCC)     Patient Active Problem List   Diagnosis Date Noted  . UTI (urinary tract infection) 03/31/2017  . NSTEMI (non-ST elevated myocardial infarction) (HCC) 11/26/2016  . Acute respiratory failure with hypoxemia (HCC) 05/06/2015  . HTN (hypertension) 05/06/2015  . Seizure disorder (HCC) 05/06/2015  . CAD (coronary artery disease) 05/06/2015  . Osteoarthritis 05/06/2015  . GERD (gastroesophageal reflux disease) 05/06/2015  . HCAP (healthcare-associated pneumonia) 05/06/2015  . Abnormal ECG 06/24/2014  .  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 Dependent edema 06/06/2014    Past Surgical History:  Procedure Laterality Date  . CHOLECYSTECTOMY      Prior to Admission medications   Medication Sig Start Date End Date Taking? Authorizing Provider  acetaminophen (TYLENOL) 325 MG tablet Take 1 tablet by mouth every 6 (six) hours.    [provider]  Aluminum & Magnesium Hydroxide (MALDROXAL ANTACID PO) Take by mouth as needed.    [provider]  aspirin EC 81 MG tablet Take 1 tablet by mouth daily. 11/11/14   [provider]  calcium-vitamin D (CALCIUM 500/D) 500-200 MG-UNIT per tablet Take 1 tablet by mouth 2 (two) times daily.     [provider]  carbamide peroxide (DEBROX) 6.5 % otic solution Place 5 drops into both ears every 14 (fourteen) days.     [provider]  cetirizine (ZYRTEC) 10 MG tablet Take 1 tablet by mouth daily as needed. 03/25/14   [provider]  cyanocobalamin (,VITAMIN B-12,) 1000 MCG/ML injection Inject 1 mL into the muscle every 30 (thirty) days.    [provider]  docusate sodium (STOOL SOFTENER) 100 MG capsule Take 1 capsule by mouth 2 (two) times daily.    [provider]  fluconazole (DIFLUCAN) 100 MG tablet Take 1 tablet (100 mg total) by mouth daily. X 4 more days 04/03/17   Enid BaasKalisetti, Radhika, MD  gabapentin (NEURONTIN) 600 MG tablet Take 1 tablet by mouth 4 (four) times daily.  06/11/14   [provider]  guaiFENesin (ROBITUSSIN) 100 MG/5ML SOLN Take 5 mLs by mouth  every 4 (four) hours as needed for cough or to loosen phlegm.    [provider]  hydrocortisone (ANUSOL-HC) 25 MG suppository Place 25 mg rectally 2 (two) times daily as needed for hemorrhoids or itching.    [provider]  hydrocortisone-pramoxine [facility]) rectal foam Place 1 applicator rectally 2 (two) times daily.    [provider]  iron polysaccharides (NIFEREX) 150 MG capsule Take 150 mg by mouth  daily. 12/12/16 12/12/17  [provider]  isosorbide mononitrate (IMDUR) 60 MG 24 hr tablet Take 1 tablet by mouth daily. 02/11/14   [provider]  loperamide (IMODIUM) 2 MG capsule Take 2 mg by mouth as needed for diarrhea or loose stools.    [provider]  LORazepam (ATIVAN) 0.5 MG tablet Take 0.5 mg by mouth every 8 (eight) hours.    [provider]  magnesium hydroxide (MILK OF MAGNESIA) 400 MG/5ML suspension Take 5 mLs by mouth as needed.    [provider]  metoprolol succinate (TOPROL-XL) 25 MG 24 hr tablet Take 1 tablet by mouth daily.    [provider]  miconazole (MICOTIN) 2 % powder Apply 1 application topically 2 (two) times daily.    [provider]  mupirocin ointment (BACTROBAN) 2 % Place 1 application into the nose 3 (three) times daily.    [provider]  nitroGLYCERIN (NITROSTAT) 0.4 MG SL tablet Place 1 tablet under the tongue as needed. 04/06/15   [provider]  nystatin (NYSTATIN) powder Apply 1 g topically as needed.    [provider]  omeprazole (PRILOSEC) 20 MG capsule Take 1 capsule by mouth daily. 01/06/15   [provider]  ondansetron (ZOFRAN ODT) 4 MG disintegrating tablet Take 1 tablet (4 mg total) by mouth every 8 (eight) hours as needed for nausea or vomiting. 03/31/17   Rebecka Apley, MD  PHENObarbital 20 MG/5ML elixir Take 15 mLs by mouth at bedtime.    [provider]  phenytoin (DILANTIN) 100 MG ER capsule Take 100 mg by mouth 3 (three) times daily.    [provider]  Skin Protectants, Misc. (EUCERIN) cream Apply 1 application topically 2 (two) times daily.     [provider]  terbinafine (LAMISIL) 1 % cream Apply 1 application topically 2 (two) times daily.    [provider]  tolnaftate (TINACTIN) 1 % spray Apply 1 application topically 2 (two) times daily.    [provider]  traMADol (ULTRAM) 50 MG tablet  Take 1 tablet (50 mg total) by mouth every 6 (six) hours as needed. 05/09/15   Enid Baas, MD    Allergies  Allergen Reactions  . Depakote [Valproic Acid]   . Divalproex Sodium Other (See Comments)    Family History  Problem Relation Age of Onset  . CAD Mother   . Congestive Heart Failure Mother   . CVA Father   . Heart attack Brother   . Hypertension Sister   . Diabetes Sister     Social History Social History  Substance Use Topics  . Smoking status: Never Smoker  . Smokeless tobacco: Never Used  . Alcohol use No    Review of Systems per patient and carever Constitutional: Negative for fever. Cardiovascular: Negative for chest pain. Respiratory: Negative for shortness of breath.new oxygen requirement noted today in the emergency department. Denies cough. Gastrointestinal: Negative for abdominal pain. Vomiting yesterday, none today. Negative for diarrhea. Genitourinary: Negative for dysuria. Neurological: Negative for headache All other ROS  negative  ____________________________________________   PHYSICAL EXAM:  VITAL SIGNS: ED Triage Vitals  Enc Vitals Group     BP 04/29/17 1711 (!) 103/54     Pulse Rate 04/29/17 1711 88     Resp 04/29/17 1711 17     Temp 04/29/17 1711 97.8 F (36.6 C)     Temp Source 04/29/17 1711 Oral     SpO2 04/29/17 1711 (!) 86 %     Weight 04/29/17 1712 119 lb (54 kg)     Height 04/29/17 1712 4\' 11"  (1.499 m)     Head Circumference --      Peak Flow --      Pain Score --      Pain Loc --      Pain Edu? --      Excl. in GC? --     Constitutional: alert, no distress. Calm and cooperative. Eyes: mild proptosis ENT   Head: Normocephalic and atraumatic   Mouth/Throat: Mucous membranes are moist.tongue protrusion which is normal per caregivers. Cardiovascular: Normal rate, regular rhythm. Respiratory: Normal respiratory effort without tachypnea nor retractions. Breath sounds are clear   Gastrointestinal: Soft and  nontender. No distention.   Musculoskeletal: Nontender with normal range of motion in all extremities.  Neurologic: moves all extremities well. Mumbled speech which caregiver status is normal. They are able to decipher most of the speech, which again they say is normal/baseline. Skin:  Skin is warm, dry and intact.  Psychiatric: Mood and affect are normal.   ____________________________________________    EKG  EKG reviewed and interpreted by myself shows sinus tachycardia 101 bpm, narrow QRS, normal axis, largely normal intervals, nonspecific ST changes without elevation.  ____________________________________________    RADIOLOGY  IMPRESSION: 1. Stable cardiomegaly and small left pleural effusion. Left basilar opacities are favored to represent atelectasis, given similar appearance to prior study, although aspiration or pneumonia cannot be entirely excluded. 2. Partially visualized dilated loops of small bowel. Recommend further evaluation with CT abdomen and pelvis.   IMPRESSION: 1. High-grade small bowel obstruction at the level of the pelvis highly concerning for internal hernia resulting in closed loop obstruction. Small amount of reactive ascites. 2. Worsening distal esophagitis. 3. 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 Recommend correlation with urinary analysis. 4. New small pleural effusions. Stable cardiomegaly. 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 5. Acute findings discussed with and reconfirmed by Dr.Kwame Ryland on 04/29/2017 at 8:10 pm. Aortic Atherosclerosis (ICD10-I70.0).  ____________________________________________   INITIAL IMPRESSION / ASSESSMENT AND PLAN / ED COURSE  Pertinent labs & imaging results that were available during my care of the patient were reviewed by me and considered in my medical decision making (see chart for details).  patient presents to the emergency department with elevated white blood cell count on outpatient labs. We will  repeat labs in the emergency Department as we are not able to view her labs. We will obtain a chest x-ray as the patient is currently hypoxic to 86% on room air with a good waveform. We will also obtain a urinalysis sample. Patient had a wet diaper on, with a bladder scan of .    patient's labs resulted showing a significant urinary tract infection with significant leukocytosis. We will send the urine culture treat with antibiotics and admitted to the hospital for further workup.   CT scan shows likely closed loop bowel obstruction with significant edema/stranding and inflammatory changes. I discussed this finding with the patient's caregivers. They are contacting family of the patient. I discussed with general  surgery who will be down to see the patient.also patient had a 11 beat run of ventricular tachycardia discussed this with the admitting hospitalist who will now be likely consulting for surgical admission.  ____________________________________________   FINAL CLINICAL IMPRESSION(S) / ED DIAGNOSES  UTI weakness    Minna Antis, MD 04/29/17 1954    Minna Antis, MD 04/29/17 2021

## 2017-04-29 NOTE — Anesthesia Post-op Follow-up Note (Signed)
Anesthesia QCDR form completed.        

## 2017-04-29 NOTE — Anesthesia Preprocedure Evaluation (Signed)
Anesthesia Evaluation  Patient identified by MRN, date of birth, ID band Patient awake    Reviewed: Allergy & Precautions, H&P , NPO status , Patient's Chart, lab work & pertinent test results, reviewed documented beta blocker date and time   Airway Mallampati: II  TM Distance: >3 FB Neck ROM: full    Dental  (+) Teeth Intact   Pulmonary neg pulmonary ROS, resolved,    Pulmonary exam normal        Cardiovascular Exercise Tolerance: Poor hypertension, + CAD and + Past MI  negative cardio ROS Normal cardiovascular exam Rhythm:regular Rate:Normal     Neuro/Psych Seizures -,  PSYCHIATRIC DISORDERS negative neurological ROS  negative psych ROS   GI/Hepatic negative GI ROS, Neg liver ROS, hiatal hernia, GERD  Medicated,  Endo/Other  negative endocrine ROS  Renal/GU negative Renal ROS  negative genitourinary   Musculoskeletal   Abdominal   Peds  Hematology negative hematology ROS (+) anemia ,   Anesthesia Other Findings Past Medical History: No date: CAD (coronary artery disease) No date: GERD (gastroesophageal reflux disease) No date: History of hiatal hernia No date: HTN (hypertension) No date: Mental retardation No date: Osteoarthritis No date: Pneumococcus infection No date: Seasonal allergies No date: Seizure (HCC) No date: Seizure (HCC) Past Surgical History: No date: CHOLECYSTECTOMY BMI    Body Mass Index:  24.04 kg/m     Reproductive/Obstetrics negative OB ROS                             Anesthesia Physical Anesthesia Plan  ASA: III and emergent  Anesthesia Plan: General ETT   Post-op Pain Management:    Induction:   PONV Risk Score and Plan: 4 or greater and Ondansetron, Dexamethasone, Midazolam and Propofol infusion  Airway Management Planned:   Additional Equipment:   Intra-op Plan:   Post-operative Plan:   Informed Consent: I have reviewed the patients  History and Physical, chart, labs and discussed the procedure including the risks, benefits and alternatives for the proposed anesthesia with the patient or authorized representative who has indicated his/her understanding and acceptance.   Dental Advisory Given  Plan Discussed with: CRNA  Anesthesia Plan Comments:         Anesthesia Quick Evaluation

## 2017-04-29 NOTE — ED Notes (Signed)
First Nurse: [facility] with reports of WBC 21,000, was given 500mg  of Rocephin IM over at the clinic. Pt has been having urinary retention and lethargy.

## 2017-04-29 NOTE — ED Notes (Signed)
Pt had large soiled brief. Bladder scan complete with a finding no larger than 

## 2017-04-29 NOTE — Anesthesia Procedure Notes (Signed)
Procedure Name: Intubation Performed by: Lendon Colonel Pre-anesthesia Checklist: Patient identified, Emergency Drugs available, Suction available, Patient being monitored and Timeout performed Patient Re-evaluated:Patient Re-evaluated prior to induction Oxygen Delivery Method: Circle system utilized Preoxygenation: Pre-oxygenation with 100% oxygen Induction Type: IV induction, Rapid sequence and Cricoid Pressure applied Ventilation: Mask ventilation without difficulty Laryngoscope Size: Mac and 3 Grade View: Grade II Tube type: Oral Tube size: 7.0 mm Number of attempts: 1 Airway Equipment and Method: Stylet Placement Confirmation: ETT inserted through vocal cords under direct vision,  positive ETCO2 and breath sounds checked- equal and bilateral Secured at: 21 cm Tube secured with: Tape Dental Injury: Teeth and Oropharynx as per pre-operative assessment  Comments: Pt to OR and monitors on.  Vss. Emesis x3 . Attempt to place NGT prior to induction unsuccessful in uncooperative patient.  Airway status deteriorating and decision to proceed with RSI plus cricoid pressure.  Easy atraumatic pass 7.0 ett with evidence of emesis at post pharynx, suctioned throughout procedure and some emesis identified in ett. French  Suction passed until clear.  Bs=bil and etco2.  ngt placed and 1 liter suctioned.  Decision to provide post op ventilatory support for overnight.  JA

## 2017-04-29 NOTE — Consult Note (Signed)
[facility] - [place] [facility]   PATIENT NAME: Hannah Vaughan    MR#:  [id number]  DATE OF BIRTH:  02-15-1941  DATE OF ADMISSION:  04/29/2017  PRIMARY CARE PHYSICIAN: Lauro Regulus, MD   REQUESTING/REFERRING PHYSICIAN: Aleen Campi, MD  CHIEF COMPLAINT:   Chief Complaint  Patient presents with  . Abnormal Lab  . Urinary Retention    HISTORY OF PRESENT ILLNESS:  Hannah Vaughan  is a 76 y.o. female who presents with Persistent nausea and vomiting. Patient has mental retardation and is nonverbal at baseline, therefore cannot contribute to her history of present illness. She is brought by caregivers from the group home where she lives. She has had significant nausea vomiting. Here in the ED she was found to have small bowel obstruction on imaging. Surgeons were called for admission and hospitalists were then consulted.  PAST MEDICAL HISTORY:   Past Medical History:  Diagnosis Date  . CAD (coronary artery disease)   . GERD (gastroesophageal reflux disease)   . History of hiatal hernia   . HTN (hypertension)   . Mental retardation   . Osteoarthritis   . Pneumococcus infection   . Seasonal allergies   . Seizure (HCC)   . Seizure (HCC)     PAST SURGICAL HISTOIRY:   Past Surgical History:  Procedure Laterality Date  . CHOLECYSTECTOMY      SOCIAL HISTORY:   Social History  Substance Use Topics  . Smoking status: Never Smoker  . Smokeless tobacco: Never Used  . Alcohol use No    FAMILY HISTORY:   Family History  Problem Relation Age of Onset  . CAD Mother   . Congestive Heart Failure Mother   . CVA Father   . Heart attack Brother   . Hypertension Sister   . Diabetes Sister     DRUG ALLERGIES:   Allergies  Allergen Reactions  . Depakote [Valproic Acid]   . Divalproex Sodium Other (See Comments)    REVIEW OF SYSTEMS:  Review of Systems  Unable to perform ROS: Medical condition    MEDICATIONS AT HOME:   Prior to Admission  medications   Medication Sig Start Date End Date Taking? Authorizing Provider  acetaminophen (TYLENOL) 325 MG tablet Take 1 tablet by mouth every 6 (six) hours.   Yes [provider]  Aluminum & Magnesium Hydroxide (MALDROXAL ANTACID PO) Take by mouth as needed.   Yes [provider]  aspirin EC 81 MG tablet Take 1 tablet by mouth daily. 11/11/14  Yes [provider]  bacitracin-polymyxin b (POLYSPORIN) ointment Apply 1 application topically as needed.   Yes [provider]  calcium-vitamin D (CALCIUM 500/D) 500-200 MG-UNIT per tablet Take 1 tablet by mouth 2 (two) times daily.    Yes [provider]  cetirizine (ZYRTEC) 10 MG tablet Take 1 tablet by mouth daily as needed. 03/25/14  Yes [provider]  cyanocobalamin (,VITAMIN B-12,) 1000 MCG/ML injection Inject 1 mL into the muscle every 30 (thirty) days.   Yes [provider]  docusate sodium (STOOL SOFTENER) 100 MG capsule Take 1 capsule by mouth 2 (two) times daily.   Yes [provider]  gabapentin (NEURONTIN) 600 MG tablet Take 1 tablet by mouth 4 (four) times daily.  06/11/14  Yes [provider]  guaiFENesin (ROBITUSSIN) 100 MG/5ML SOLN Take 5 mLs by mouth every 4 (four) hours as needed for cough or to loosen phlegm.   Yes [provider]  hydrochlorothiazide (HYDRODIURIL) 25  MG tablet Take 1 tablet by mouth daily. 02/25/17  Yes [provider]  hydrocortisone (ANUSOL-HC) 25 MG suppository Place 25 mg rectally 2 (two) times daily as needed for hemorrhoids or itching.   Yes [provider]  hydrocortisone-pramoxine (PROCTOFOAM-HC) rectal foam Place 1 applicator rectally 2 (two) times daily.   Yes [provider]  iron polysaccharides (NIFEREX) 150 MG capsule Take 150 mg by mouth daily. 12/12/16 12/12/17 Yes [provider]  isosorbide mononitrate (IMDUR) 60 MG 24 hr tablet Take 1 tablet by mouth daily. 02/11/14  Yes [provider]  loperamide (IMODIUM) 2 MG capsule Take 2 mg by mouth as needed for diarrhea or loose stools.   Yes [provider]  LORazepam (ATIVAN) 0.5 MG tablet Take 0.5 mg by mouth every 8 (eight) hours.   Yes [provider]  magnesium hydroxide (MILK OF MAGNESIA) 400 MG/5ML suspension Take 5 mLs by mouth as needed.   Yes [provider]  metoprolol succinate (TOPROL-XL) 25 MG 24 hr tablet Take 1 tablet by mouth daily.   Yes [provider]  miconazole (MICOTIN) 2 % powder Apply 1 application topically 2 (two) times daily.   Yes [provider]  mupirocin ointment (BACTROBAN) 2 % Place 1 application into the nose 3 (three) times daily.   Yes [provider]  nitroGLYCERIN (NITROSTAT) 0.4 MG SL tablet Place 1 tablet under the tongue as needed. 04/06/15  Yes [provider]  nystatin (NYSTATIN) powder Apply 1 g topically as needed.   Yes [provider]  omeprazole (PRILOSEC) 20 MG capsule Take 1 capsule by mouth daily. 01/06/15  Yes [provider]  ondansetron (ZOFRAN ODT) 4 MG disintegrating tablet Take 1 tablet (4 mg total) by mouth every 8 (eight) hours as needed for nausea or vomiting. 03/31/17  Yes Rebecka Apley, MD  PHENObarbital 20 MG/5ML elixir Take 15 mLs by mouth at bedtime.   Yes [provider]  phenytoin (DILANTIN) 100 MG ER capsule Take 100 mg by mouth 3 (three) times daily.   Yes [provider]  Skin Protectants, Misc. (EUCERIN) cream Apply 1 application topically 2 (two) times daily.    Yes [provider]  terbinafine (LAMISIL) 1 % cream Apply 1 application topically 2 (two) times daily.   Yes [provider]  tolnaftate (TINACTIN) 1 % spray Apply 1 application topically 2 (two) times daily.   Yes [provider]  traMADol (ULTRAM) 50 MG tablet Take 1 tablet (50 mg total) by mouth every 6 (six) hours as needed. 05/09/15  Yes Enid Baas, MD   carbamide peroxide (DEBROX) 6.5 % otic solution Place 5 drops into both ears every 14 (fourteen) days.     [provider]  fluconazole (DIFLUCAN) 100 MG tablet Take 1 tablet (100 mg total) by mouth daily. X 4 more days Patient not taking: Reported on 04/29/2017 04/03/17   Enid Baas, MD      VITAL SIGNS:   Vitals:   04/29/17 1800 04/29/17 1830 04/29/17 1900 04/29/17 2100  BP: 126/81 136/72 137/72 109/62  Pulse:  83 86 85  Resp: 17 15 17  (!) 23  Temp:      TempSrc:      SpO2:  98% 99% 97%  Weight:      Height:       Wt Readings from Last 3 Encounters:  04/29/17 54 kg (119 lb)  03/31/17 54 kg (119 lb)  03/30/17 54 kg (119 lb)  PHYSICAL EXAMINATION:  Physical Exam  Vitals reviewed. Constitutional: She appears well-developed and well-nourished. No distress.  HENT:  Head: Normocephalic and atraumatic.  Mouth/Throat: Oropharynx is clear and moist.  Eyes: Pupils are equal, round, and reactive to light. Conjunctivae and EOM are normal. No scleral icterus.  Neck: Normal range of motion. Neck supple. No JVD present. No thyromegaly present.  Cardiovascular: Normal rate, regular rhythm and intact distal pulses.  Exam reveals no gallop and no friction rub.   No murmur heard. Respiratory: Effort normal and breath sounds normal. No respiratory distress. She has no wheezes. She has no rales.  GI: Soft. Bowel sounds are normal. She exhibits no distension. There is tenderness.  Musculoskeletal: Normal range of motion. She exhibits no edema.  No arthritis, no gout  Lymphadenopathy:    She has no cervical adenopathy.  Neurological:  Unable to assess due to patient condition  Skin: Skin is warm and dry. No rash noted. No erythema.  Psychiatric:  Unable to assess due to patient condition     LABORATORY PANEL:   CBC  Recent Labs Lab 04/29/17 1810  WBC 20.1*  HGB 11.7*  HCT 37.8  PLT 314    ------------------------------------------------------------------------------------------------------------------  Chemistries   Recent Labs Lab 04/29/17 1843  NA 143  K 3.1*  CL 95*  CO2 35*  GLUCOSE 143*  BUN 47*  CREATININE 1.10*  CALCIUM 9.7  AST 24  ALT 25  ALKPHOS 141*  BILITOT 0.9   ------------------------------------------------------------------------------------------------------------------  Cardiac Enzymes  Recent Labs Lab 04/29/17 1843  TROPONINI 0.09*   ------------------------------------------------------------------------------------------------------------------  RADIOLOGY:  Ct Abdomen Pelvis W Contrast  Result Date: 04/29/2017 CLINICAL DATA:  Aspiration. Follow-up dilated small bowel seen on chest radiograph. EXAM: CT ABDOMEN AND PELVIS WITH CONTRAST TECHNIQUE: Multidetector CT imaging of the abdomen and pelvis was performed using the standard protocol following bolus administration of intravenous contrast. CONTRAST:  ISOVUE-300 IOPAMIDOL (ISOVUE-300) INJECTION 61% COMPARISON:  CT abdomen and pelvis March 31, 2017 and chest radiograph April 29, 2017 at 1752 hours FINDINGS: LOWER CHEST: New small pleural effusions. Old RIGHT posterior rib fractures. Heart is moderately enlarged and unchanged. 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 No pericardial effusion. Again noted is distal esophageal wall thickening and edema. 9 mm enhancing para esophageal lymph node. HEPATOBILIARY: Status post cholecystectomy. Mild postprocedural intrahepatic biliary dilatation. PANCREAS: Normal. SPLEEN: Normal. ADRENALS/URINARY TRACT: Kidneys are orthotopic, demonstrating symmetric enhancement. No nephrolithiasis, hydronephrosis or solid renal masses. The unopacified ureters are normal in course and caliber. Delayed imaging through the kidneys demonstrates symmetric prompt contrast excretion within the proximal urinary collecting system. Urinary bladder is decompressed with  disproportionate wall thickening and pericystic inflammation. Normal adrenal glands. STOMACH/BOWEL: Fluid distended stomach. Fluid felt distended small bowel at 3.7 cm. Central pelvis there are 2 transition points involving single loop of small bowel with pointed appearance of the mesentery and, surrounding free fluid. Mild colonic diverticulosis and decompressed colon. VASCULAR/LYMPHATIC: Aortoiliac vessels are normal in course and caliber. Moderate to severe atherosclerosis. No lymphadenopathy by CT size criteria. REPRODUCTIVE: Status post hysterectomy. OTHER: Small amount of ascites. MUSCULOSKELETAL: Nonacute. Grade 1 L5-S1 anterolisthesis on the basis of chronic L5 pars interarticularis defects. IMPRESSION: 1. High-grade small bowel obstruction at the level of the pelvis highly concerning for internal hernia resulting in closed loop obstruction. Small amount of reactive ascites. 2. Worsening distal esophagitis. 3. 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 Recommend correlation with urinary analysis. 4. New small pleural effusions. Stable cardiomegaly. 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 5. Acute findings discussed with and  reconfirmed by Dr.KEVIN PADUCHOWSKI on 04/29/2017 at 8:10 pm. Aortic Atherosclerosis (ICD10-I70.0). Electronically Signed   By: Awilda Metroourtnay  Bloomer M.D.   On: 04/29/2017 20:13   Dg Chest Portable 1 View  Result Date: 04/29/2017 CLINICAL DATA:  Aspiration. EXAM: PORTABLE CHEST 1 VIEW COMPARISON:  Chest x-ray November 26, 2016. FINDINGS: Cardiomegaly, unchanged. Left ventricular wall calcification again noted. Small left pleural effusion and adjacent left lower lobe opacity. Right lung is clear. No pneumothorax. No acute osseous abnormality. Old right-sided rib fractures. Partially visualized dilated loops of small bowel. Air-fluid level in the stomach. Status post cholecystectomy. IMPRESSION: 1. Stable cardiomegaly and small left pleural effusion. Left basilar opacities are favored to represent  atelectasis, given similar appearance to prior study, although aspiration or pneumonia cannot be entirely excluded. 2. Partially visualized dilated loops of small bowel. Recommend further evaluation with CT abdomen and pelvis. Electronically Signed   By: Obie DredgeWilliam T Derry M.D.   On: 04/29/2017 18:26    EKG:   Orders placed or performed during the hospital encounter of 04/29/17  . EKG 12-Lead  . EKG 12-Lead    IMPRESSION AND PLAN:  Principal Problem:   SBO (small bowel obstruction) (HCC) - plan per surgery is to take the patient to the OR for exploratory laparotomy. Defer to surgical team recommendations for management of the same Active Problems:   HTN (hypertension) - continue home meds   CAD (coronary artery disease) - patient's troponin was very mildly elevated, this is likely demand ischemia given her significant nausea and vomiting, we will trend her cardiac enzymes postop, continue home medications   Seizure disorder (HCC) - continue home dose antiepileptics   GERD (gastroesophageal reflux disease) - home dose PPI  All the records are reviewed and case discussed with ED provider. Management plans discussed with the patient and/or family.  CODE STATUS: Full    Code Status Orders        Start     Ordered   04/29/17 2110  Full code  Continuous     04/29/17 2113    Code Status History    Date Active Date Inactive Code Status Order ID Comments User Context   03/31/2017 11:16 PM 04/02/2017  8:03 PM Full Code [id number]  Oralia ManisWillis, Nyles Mitton, MD Inpatient   11/26/2016  3:02 PM 11/28/2016  6:18 PM Full Code [id number]  Milagros LollSudini, Srikar, MD ED   05/06/2015 11:52 PM 05/09/2015  8:04 PM Full Code [id number]  Oralia ManisWillis, Dareon Nunziato, MD Inpatient      TOTAL TIME TAKING CARE OF THIS PATIENT: 35 minutes.    Anne HahnWILLIS, Peirce Deveney FIELDING 04/29/2017, 9:26 PM  [facility]  Office  [phone]  CC: Primary care Physician: Lauro RegulusAnderson, Marshall W, MD  Note:  This document was prepared using Dragon  voice recognition software and may include unintentional dictation errors.

## 2017-04-29 NOTE — H&P (Signed)
Date of Admission:  04/29/2017  Reason for Admission:  Closed loop obstruction  History of Present Illness: Hannah Vaughan is a 76 y.o. female who presents with 2 day history of nausea and vomiting. Her history is complicated by history of mental retardation patient is not able to express appropriately her symptoms. Her caregiver gives her history at this point. She has had episodes of vomiting over the last 2 days as well as complaints of abdominal pain going from her back radiating towards the front and lower abdomen. Today she was noticed to be more lethargic with decreased oxygen saturation and with a low-grade temperature less than 100 and was brought to the hospital for further evaluation. Her caregiver reports that the emesis had been feculent in nature. It is unclear although suspected that she has not had a bowel movement over the last 2 days either. Also it is unclear how much urine she's been voiding today.  She does have a recent history of severe UTI and has been admitted multiple times with this. She did have an admission in March 2018 for a suspected NSTEMI by cardiology believed this was more atypical chest pain rather than a true MI. No further interventions were needed she has followed up with cardiology since then.  In the emergency room, she was noted to have an elevated white blood cell count of 20.1, an elevated creatinine of 1.1 relative to baseline of 0.68 last month and a mildly elevated troponin of 0.09 although no evidence of ischemia on EKG. She also has a UTI with many bacteria and white blood cells noted on her urinalysis. She did have a CT scan which is concerning for a closed loop obstruction of small bowel in the pelvis with associated free fluid.  Past Medical History: Past Medical History:  Diagnosis Date  . CAD (coronary artery disease)   . GERD (gastroesophageal reflux disease)   . History of hiatal hernia   . HTN (hypertension)   . Mental retardation   .  Osteoarthritis   . Pneumococcus infection   . Seasonal allergies   . Seizure (HCC)   . Seizure [facility])      Past Surgical History: Past Surgical History:  Procedure Laterality Date  . CHOLECYSTECTOMY Hysterectomy       Home Medications: Prior to Admission medications   Medication Sig Start Date End Date Taking? Authorizing Provider  acetaminophen (TYLENOL) 325 MG tablet Take 1 tablet by mouth every 6 (six) hours.   Yes [provider]  Aluminum & Magnesium Hydroxide (MALDROXAL ANTACID PO) Take by mouth as needed.   Yes [provider]  aspirin EC 81 MG tablet Take 1 tablet by mouth daily. 11/11/14  Yes [provider]  bacitracin-polymyxin b (POLYSPORIN) ointment Apply 1 application topically as needed.   Yes [provider]  calcium-vitamin D (CALCIUM 500/D) 500-200 MG-UNIT per tablet Take 1 tablet by mouth 2 (two) times daily.    Yes [provider]  cetirizine (ZYRTEC) 10 MG tablet Take 1 tablet by mouth daily as needed. 03/25/14  Yes [provider]  cyanocobalamin (,VITAMIN B-12,) 1000 MCG/ML injection Inject 1 mL into the muscle every 30 (thirty) days.   Yes [provider]  docusate sodium (STOOL SOFTENER) 100 MG capsule Take 1 capsule by mouth 2 (two) times daily.   Yes [provider]  gabapentin (NEURONTIN) 600 MG tablet Take 1 tablet by mouth 4 (four) times daily.  06/11/14  Yes [provider]  guaiFENesin (ROBITUSSIN)  100 MG/5ML SOLN Take 5 mLs by mouth every 4 (four) hours as needed for cough or to loosen phlegm.   Yes [provider]  hydrochlorothiazide (HYDRODIURIL) 25 MG tablet Take 1 tablet by mouth daily. 02/25/17  Yes [provider]  hydrocortisone (ANUSOL-HC) 25 MG suppository Place 25 mg rectally 2 (two) times daily as needed for hemorrhoids or itching.   Yes [provider]  hydrocortisone-pramoxine (PROCTOFOAM-HC) rectal foam Place 1 applicator rectally 2 (two)  times daily.   Yes [provider]  iron polysaccharides (NIFEREX) 150 MG capsule Take 150 mg by mouth daily. 12/12/16 12/12/17 Yes [provider]  isosorbide mononitrate (IMDUR) 60 MG 24 hr tablet Take 1 tablet by mouth daily. 02/11/14  Yes [provider]  loperamide (IMODIUM) 2 MG capsule Take 2 mg by mouth as needed for diarrhea or loose stools.   Yes [provider]  LORazepam (ATIVAN) 0.5 MG tablet Take 0.5 mg by mouth every 8 (eight) hours.   Yes [provider]  magnesium hydroxide (MILK OF MAGNESIA) 400 MG/5ML suspension Take 5 mLs by mouth as needed.   Yes [provider]  metoprolol succinate (TOPROL-XL) 25 MG 24 hr tablet Take 1 tablet by mouth daily.   Yes [provider]  miconazole (MICOTIN) 2 % powder Apply 1 application topically 2 (two) times daily.   Yes [provider]  mupirocin ointment (BACTROBAN) 2 % Place 1 application into the nose 3 (three) times daily.   Yes [provider]  nitroGLYCERIN (NITROSTAT) 0.4 MG SL tablet Place 1 tablet under the tongue as needed. 04/06/15  Yes [provider]  nystatin (NYSTATIN) powder Apply 1 g topically as needed.   Yes [provider]  omeprazole (PRILOSEC) 20 MG capsule Take 1 capsule by mouth daily. 01/06/15  Yes [provider]  ondansetron (ZOFRAN ODT) 4 MG disintegrating tablet Take 1 tablet (4 mg total) by mouth every 8 (eight) hours as needed for nausea or vomiting. 03/31/17  Yes Rebecka Apley, MD  PHENObarbital 20 MG/5ML elixir Take 15 mLs by mouth at bedtime.   Yes [provider]  phenytoin (DILANTIN) 100 MG ER capsule Take 100 mg by mouth 3 (three) times daily.   Yes [provider]  Skin Protectants, Misc. (EUCERIN) cream Apply 1 application topically 2 (two) times daily.    Yes [provider]  terbinafine (LAMISIL) 1 % cream Apply 1 application topically 2 (two) times daily.   Yes [provider]  tolnaftate (TINACTIN) 1 % spray Apply 1 application topically 2 (two) times daily.   Yes [provider]  traMADol (ULTRAM) 50 MG tablet Take 1 tablet (50 mg total) by mouth every 6 (six) hours as needed. 05/09/15  Yes Enid Baas, MD  carbamide peroxide (DEBROX) 6.5 % otic solution Place 5 drops into both ears every 14 (fourteen) days.     [provider]  fluconazole (DIFLUCAN) 100 MG tablet Take 1 tablet (100 mg total) by mouth daily. X 4 more days Patient not taking: Reported on 04/29/2017 04/03/17   Enid Baas, MD    Allergies: Allergies  Allergen Reactions  . Depakote [Valproic Acid]   . Divalproex Sodium Other (See Comments)    Social History:  reports that she has never smoked. She has never used smokeless tobacco. She reports that she does not drink alcohol or use drugs.   Family History: Family History  Problem Relation Age of Onset  . CAD Mother   .  Congestive Heart Failure Mother   . CVA Father   . Heart attack Brother   . Hypertension Sister   . Diabetes Sister     Review of Systems: Review of Systems  Unable to perform ROS: Mental acuity    Physical Exam BP 109/62   Pulse 85   Temp 97.8 F (36.6 C) (Oral)   Resp (!) 23   Ht 4\' 11"  (1.499 m)   Wt 54 kg (119 lb)   SpO2 97%   BMI 24.04 kg/m  CONSTITUTIONAL: No acute distress HEENT:  Normocephalic, atraumatic, extraocular motion intact. NECK: Trachea is midline, and there is no jugular venous distension.  RESPIRATORY:  Lungs are clear, and breath sounds are equal bilaterally. Normal respiratory effort without pathologic use of accessory muscles. CARDIOVASCULAR: Regular rhythm and rate, though she does have PVCs on the monitor. GI: The abdomen is soft, distended, but does not appear to be tender to palpation. There is feculent smell from her mouth likely from the episodes of emesis.  MUSCULOSKELETAL:  Normal muscle strength and tone in all four extremities.  No  peripheral edema or cyanosis. SKIN: Skin turgor is normal. There are no pathologic skin lesions.  NEUROLOGIC:  Motor and sensation is grossly normal.  Cranial nerves are grossly intact. PSYCH:  Unable to obtain.  Laboratory Analysis: Results for orders placed or performed during the hospital encounter of 04/29/17 (from the past 24 hour(s))  CBC     Status: Abnormal   Collection Time: 04/29/17  6:10 PM  Result Value Ref Range   WBC 20.1 (H) 3.6 - 11.0 K/uL   RBC 4.89 3.80 - 5.20 MIL/uL   Hemoglobin 11.7 (L) 12.0 - 16.0 g/dL   HCT 40.9 81.1 - 91.4 %   MCV 77.3 (L) 80.0 - 100.0 fL   MCH 24.0 (L) 26.0 - 34.0 pg   MCHC 31.1 (L) 32.0 - 36.0 g/dL   RDW 78.2 (H) 95.6 - 21.3 %   Platelets 314 150 - 440 K/uL  Urinalysis, Routine w reflex microscopic     Status: Abnormal   Collection Time: 04/29/17  6:10 PM  Result Value Ref Range   Color, Urine YELLOW (A) YELLOW   APPearance TURBID (A) CLEAR   Specific Gravity, Urine 1.018 1.005 - 1.030   pH 6.0 5.0 - 8.0   Glucose, UA NEGATIVE NEGATIVE mg/dL   Hgb urine dipstick SMALL (A) NEGATIVE   Bilirubin Urine NEGATIVE NEGATIVE   Ketones, ur NEGATIVE NEGATIVE mg/dL   Protein, ur 086 (A) NEGATIVE mg/dL   Nitrite NEGATIVE NEGATIVE   Leukocytes, UA MODERATE (A) NEGATIVE   RBC / HPF TOO NUMEROUS TO COUNT 0 - 5 RBC/hpf   WBC, UA TOO NUMEROUS TO COUNT 0 - 5 WBC/hpf   Bacteria, UA MANY (A) NONE SEEN   Squamous Epithelial / LPF NONE SEEN NONE SEEN   Mucous PRESENT   Comprehensive metabolic panel     Status: Abnormal   Collection Time: 04/29/17  6:43 PM  Result Value Ref Range   Sodium 143 135 - 145 mmol/L   Potassium 3.1 (L) 3.5 - 5.1 mmol/L   Chloride 95 (L) 101 - 111 mmol/L   CO2 35 (H) 22 - 32 mmol/L   Glucose, Bld 143 (H) 65 - 99 mg/dL   BUN 47 (H) 6 - 20 mg/dL   Creatinine, Ser 5.78 (H) 0.44 - 1.00 mg/dL   Calcium 9.7 8.9 - 46.9 mg/dL   Total Protein 6.9 6.5 - 8.1 g/dL   Albumin  3.2 (L) 3.5 - 5.0 g/dL   AST 24 15 - 41 U/L   ALT 25 14 - 54  U/L   Alkaline Phosphatase 141 (H) 38 - 126 U/L   Total Bilirubin 0.9 0.3 - 1.2 mg/dL   GFR calc non Af Amer 48 (L) >60 mL/min   GFR calc Af Amer 56 (L) >60 mL/min   Anion gap 13 5 - 15  Troponin I     Status: Abnormal   Collection Time: 04/29/17  6:43 PM  Result Value Ref Range   Troponin I 0.09 (HH) <0.03 ng/mL    Imaging: Ct Abdomen Pelvis W Contrast  Result Date: 04/29/2017 CLINICAL DATA:  Aspiration. Follow-up dilated small bowel seen on chest radiograph. EXAM: CT ABDOMEN AND PELVIS WITH CONTRAST TECHNIQUE: Multidetector CT imaging of the abdomen and pelvis was performed using the standard protocol following bolus administration of intravenous contrast. CONTRAST:  100mL ISOVUE-300 IOPAMIDOL (ISOVUE-300) INJECTION 61% COMPARISON:  CT abdomen and pelvis March 31, 2017 and chest radiograph April 29, 2017 at 1752 hours FINDINGS: LOWER CHEST: New small pleural effusions. Old RIGHT posterior rib fractures. Heart is moderately enlarged and unchanged. 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 No pericardial effusion. Again noted is distal esophageal wall thickening and edema. 9 mm enhancing para esophageal lymph node. HEPATOBILIARY: Status post cholecystectomy. Mild postprocedural intrahepatic biliary dilatation. PANCREAS: Normal. SPLEEN: Normal. ADRENALS/URINARY TRACT: Kidneys are orthotopic, demonstrating symmetric enhancement. No nephrolithiasis, hydronephrosis or solid renal masses. The unopacified ureters are normal in course and caliber. Delayed imaging through the kidneys demonstrates symmetric prompt contrast excretion within the proximal urinary collecting system. Urinary bladder is decompressed with disproportionate wall thickening and pericystic inflammation. Normal adrenal glands. STOMACH/BOWEL: Fluid distended stomach. Fluid felt distended small bowel at 3.7 cm. Central pelvis there are 2 transition points involving single loop of small bowel with pointed appearance of the mesentery and,  surrounding free fluid. Mild colonic diverticulosis and decompressed colon. VASCULAR/LYMPHATIC: Aortoiliac vessels are normal in course and caliber. Moderate to severe atherosclerosis. No lymphadenopathy by CT size criteria. REPRODUCTIVE: Status post hysterectomy. OTHER: Small amount of ascites. MUSCULOSKELETAL: Nonacute. Grade 1 L5-S1 anterolisthesis on the basis of chronic L5 pars interarticularis defects. IMPRESSION: 1. High-grade small bowel obstruction at the level of the pelvis highly concerning for internal hernia resulting in closed loop obstruction. Small amount of reactive ascites. 2. Worsening distal esophagitis. 3. 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 Recommend correlation with urinary analysis. 4. New small pleural effusions. Stable cardiomegaly. 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 5. Acute findings discussed with and reconfirmed by Dr.KEVIN PADUCHOWSKI on 04/29/2017 at 8:10 pm. Aortic Atherosclerosis (ICD10-I70.0). Electronically Signed   By: Awilda Metroourtnay  Bloomer M.D.   On: 04/29/2017 20:13   Dg Chest Portable 1 View  Result Date: 04/29/2017 CLINICAL DATA:  Aspiration. EXAM: PORTABLE CHEST 1 VIEW COMPARISON:  Chest x-ray November 26, 2016. FINDINGS: Cardiomegaly, unchanged. Left ventricular wall calcification again noted. Small left pleural effusion and adjacent left lower lobe opacity. Right lung is clear. No pneumothorax. No acute osseous abnormality. Old right-sided rib fractures. Partially visualized dilated loops of small bowel. Air-fluid level in the stomach. Status post cholecystectomy. IMPRESSION: 1. Stable cardiomegaly and small left pleural effusion. Left basilar opacities are favored to represent atelectasis, given similar appearance to prior study, although aspiration or pneumonia cannot be entirely excluded. 2. Partially visualized dilated loops of small bowel. Recommend further evaluation with CT abdomen and pelvis. Electronically Signed   By: Obie DredgeWilliam T Derry M.D.   On: 04/29/2017 18:26     Assessment  and Plan: This is a 76 y.o. female who presents with a two-day history of vomiting, distention, and findings concerning for a closed loop obstruction. I independently reviewed the patient's imaging studies as well as reviewed her laboratory studies. Overall she does have a small segment of small bowel that appears to be twisted on itself in the pelvis consistent with a closed loop obstruction. Proximal to this also the small bowel is dilated with a very distended stomach. There is free fluid in the pelvis right at the point of the closed loop obstruction, concerning for possible bowel ischemia. There is also concerned looking at her x-ray as well as the lower portion of her chest on the CT scan that she does have pleural effusions and possibly an aspiration pneumonia.  However the patient does not appear to be peritoneal on exam. Nevertheless given her elevated white blood cell count and CT scan findings, it would be prudent to take the patient to the operating room for an exploratory laparotomy. I discussed the case with her sister, Marybelle Killings, who is her healthcare power of attorney and makes medical decisions for her. I did discuss the risks of bleeding, infection, and injury to surrounding structures and her sisters willing to proceed with surgery. She will be on IV antibiotics, nothing by mouth, with NG tube to suction, and IV fluid hydration. Depending on the findings in the operating room, she may require small bowel resection and possibly drain placement. All this was discussed with her sister. She will go to the operating room tonight. Given her history of other medical comorbidities including seizure disorder, coronary artery disease, we'll place consults for the hospitalist as well as cardiology for further assistance in her management.    Howie Ill, MD [facility]

## 2017-04-29 NOTE — ED Triage Notes (Signed)
[facility] retention and elevated WBC count. Pt from group home ([address], [place]). Pt sent over after being given 500mg  rocephin injection in office, caregiver at bedside.

## 2017-04-30 ENCOUNTER — Encounter: Payer: Self-pay | Admitting: Surgery

## 2017-04-30 ENCOUNTER — Inpatient Hospital Stay: Payer: Medicare Other

## 2017-04-30 LAB — BLOOD GAS, ARTERIAL
Acid-Base Excess: 11.6 mmol/L — ABNORMAL HIGH (ref 0.0–2.0)
Acid-Base Excess: 13.4 mmol/L — ABNORMAL HIGH (ref 0.0–2.0)
BICARBONATE: 36.6 mmol/L — AB (ref 20.0–28.0)
Bicarbonate: 35.9 mmol/L — ABNORMAL HIGH (ref 20.0–28.0)
FIO2: 0.7
FIO2: 0.7
MECHANICAL RATE: 15
MECHVT: 500 mL
O2 SAT: 98.2 %
O2 Saturation: 97.1 %
PATIENT TEMPERATURE: 37
PATIENT TEMPERATURE: 37
PCO2 ART: 45 mmHg (ref 32.0–48.0)
PEEP/CPAP: 5 cmH2O
PEEP: 5 cmH2O
PO2 ART: 97 mmHg (ref 83.0–108.0)
VT: 450 mL
pCO2 arterial: 40 mmHg (ref 32.0–48.0)
pH, Arterial: 7.51 — ABNORMAL HIGH (ref 7.350–7.450)
pH, Arterial: 7.57 — ABNORMAL HIGH (ref 7.350–7.450)
pO2, Arterial: 78 mmHg — ABNORMAL LOW (ref 83.0–108.0)

## 2017-04-30 LAB — CBC
HCT: 35.2 % (ref 35.0–47.0)
HEMOGLOBIN: 11.2 g/dL — AB (ref 12.0–16.0)
MCH: 24.4 pg — AB (ref 26.0–34.0)
MCHC: 31.9 g/dL — AB (ref 32.0–36.0)
MCV: 76.5 fL — ABNORMAL LOW (ref 80.0–100.0)
PLATELETS: 237 10*3/uL (ref 150–440)
RBC: 4.6 MIL/uL (ref 3.80–5.20)
RDW: 20.8 % — ABNORMAL HIGH (ref 11.5–14.5)
WBC: 3.5 10*3/uL — ABNORMAL LOW (ref 3.6–11.0)

## 2017-04-30 LAB — BASIC METABOLIC PANEL
ANION GAP: 10 (ref 5–15)
BUN: 43 mg/dL — AB (ref 6–20)
CALCIUM: 9 mg/dL (ref 8.9–10.3)
CO2: 35 mmol/L — ABNORMAL HIGH (ref 22–32)
Chloride: 101 mmol/L (ref 101–111)
Creatinine, Ser: 0.95 mg/dL (ref 0.44–1.00)
GFR calc Af Amer: >60 mL/min (ref >60)
GFR, EST NON AFRICAN AMERICAN: 58 mL/min — AB (ref >60)
Glucose, Bld: 129 mg/dL — ABNORMAL HIGH (ref 65–99)
POTASSIUM: 2.8 mmol/L — AB (ref 3.5–5.1)
SODIUM: 146 mmol/L — AB (ref 135–145)

## 2017-04-30 LAB — MRSA PCR SCREENING: MRSA by PCR: NEGATIVE

## 2017-04-30 LAB — PHENYTOIN LEVEL, TOTAL: Phenytoin Lvl: 4.8 ug/mL — ABNORMAL LOW (ref 10.0–20.0)

## 2017-04-30 LAB — PROCALCITONIN: Procalcitonin: 0.47 ng/mL

## 2017-04-30 LAB — GLUCOSE, CAPILLARY
GLUCOSE-CAPILLARY: 117 mg/dL — AB (ref 65–99)
GLUCOSE-CAPILLARY: 125 mg/dL — AB (ref 65–99)
GLUCOSE-CAPILLARY: 141 mg/dL — AB (ref 65–99)
GLUCOSE-CAPILLARY: 97 mg/dL (ref 65–99)
Glucose-Capillary: 106 mg/dL — ABNORMAL HIGH (ref 65–99)
Glucose-Capillary: 130 mg/dL — ABNORMAL HIGH (ref 65–99)
Glucose-Capillary: 153 mg/dL — ABNORMAL HIGH (ref 65–99)
Glucose-Capillary: 146 mg/dL — ABNORMAL HIGH (ref 65–99)

## 2017-04-30 LAB — POTASSIUM: POTASSIUM: 3.8 mmol/L (ref 3.5–5.1)

## 2017-04-30 LAB — TROPONIN I
TROPONIN I: 0.07 ng/mL — AB (ref <0.03)
Troponin I: 0.09 ng/mL (ref <0.03)

## 2017-04-30 LAB — LACTIC ACID, PLASMA
Lactic Acid, Venous: 2.4 mmol/L (ref 0.5–1.9)
Lactic Acid, Venous: 3.1 mmol/L (ref 0.5–1.9)

## 2017-04-30 LAB — PHOSPHORUS: Phosphorus: 3.1 mg/dL (ref 2.5–4.6)

## 2017-04-30 LAB — MAGNESIUM: MAGNESIUM: 1.7 mg/dL (ref 1.7–2.4)

## 2017-04-30 MED ORDER — FENTANYL CITRATE (PF) 100 MCG/2ML IJ SOLN
50.0000 ug | Freq: Once | INTRAMUSCULAR | Status: AC
  Administered 2017-04-30: 50 ug via INTRAVENOUS
  Filled 2017-04-30: qty 2

## 2017-04-30 MED ORDER — MAGNESIUM SULFATE 2 GM/50ML IV SOLN
2.0000 g | Freq: Once | INTRAVENOUS | Status: AC
  Administered 2017-04-30: 2 g via INTRAVENOUS
  Filled 2017-04-30: qty 50

## 2017-04-30 MED ORDER — FENTANYL CITRATE (PF) 100 MCG/2ML IJ SOLN
50.0000 ug | INTRAMUSCULAR | Status: DC | PRN
  Administered 2017-04-30 (×2): 50 ug via INTRAVENOUS

## 2017-04-30 MED ORDER — CHLORHEXIDINE GLUCONATE 0.12% ORAL RINSE (MEDLINE KIT)
15.0000 mL | Freq: Two times a day (BID) | OROMUCOSAL | Status: DC
  Administered 2017-04-30 – 2017-05-08 (×19): 15 mL via OROMUCOSAL

## 2017-04-30 MED ORDER — POTASSIUM CHLORIDE 10 MEQ/100ML IV SOLN
10.0000 meq | INTRAVENOUS | Status: DC
  Filled 2017-04-30 (×2): qty 100

## 2017-04-30 MED ORDER — SODIUM CHLORIDE 0.9 % IV BOLUS (SEPSIS)
500.0000 mL | Freq: Once | INTRAVENOUS | Status: AC
  Administered 2017-04-30: 500 mL via INTRAVENOUS

## 2017-04-30 MED ORDER — SODIUM CHLORIDE 0.9 % IV SOLN
125.0000 mg | Freq: Three times a day (TID) | INTRAVENOUS | Status: DC
  Administered 2017-04-30 – 2017-05-03 (×11): 125 mg via INTRAVENOUS
  Filled 2017-04-30 (×15): qty 2.5

## 2017-04-30 MED ORDER — MIDAZOLAM HCL 2 MG/2ML IJ SOLN: 1.0000 mg | INTRAMUSCULAR | Status: DC | PRN

## 2017-04-30 MED ORDER — IPRATROPIUM-ALBUTEROL 0.5-2.5 (3) MG/3ML IN SOLN: 3.0000 mL | Freq: Four times a day (QID) | RESPIRATORY_TRACT | Status: DC | PRN

## 2017-04-30 MED ORDER — POTASSIUM CHLORIDE 10 MEQ/100ML IV SOLN
10.0000 meq | INTRAVENOUS | Status: AC
  Administered 2017-04-30 (×5): 10 meq via INTRAVENOUS
  Filled 2017-04-30 (×5): qty 100

## 2017-04-30 MED ORDER — SODIUM CHLORIDE 0.9 % IV SOLN
3.0000 g | Freq: Four times a day (QID) | INTRAVENOUS | Status: DC
  Administered 2017-04-30 – 2017-05-05 (×17): 3 g via INTRAVENOUS
  Filled 2017-04-30 (×22): qty 3

## 2017-04-30 MED ORDER — SODIUM CHLORIDE 0.9 % IV BOLUS (SEPSIS)
1000.0000 mL | Freq: Once | INTRAVENOUS | Status: AC
  Administered 2017-04-30: 1000 mL via INTRAVENOUS

## 2017-04-30 MED ORDER — INSULIN ASPART 100 UNIT/ML ~~LOC~~ SOLN
0.0000 [IU] | SUBCUTANEOUS | Status: DC
  Administered 2017-04-30 (×2): 1 [IU] via SUBCUTANEOUS
  Administered 2017-04-30: 2 [IU] via SUBCUTANEOUS
  Administered 2017-04-30 – 2017-05-03 (×2): 1 [IU] via SUBCUTANEOUS
  Administered 2017-05-04: 2 [IU] via SUBCUTANEOUS
  Administered 2017-05-05 (×2): 1 [IU] via SUBCUTANEOUS
  Filled 2017-04-30 (×7): qty 1

## 2017-04-30 MED ORDER — FENTANYL CITRATE (PF) 100 MCG/2ML IJ SOLN: 25.0000 ug | INTRAMUSCULAR | Status: DC | PRN

## 2017-04-30 MED ORDER — FENTANYL CITRATE (PF) 100 MCG/2ML IJ SOLN: 50.0000 ug | INTRAMUSCULAR | Status: DC | PRN

## 2017-04-30 MED ORDER — FENTANYL 2500MCG IN NS 250ML (10MCG/ML) PREMIX INFUSION
25.0000 ug/h | INTRAVENOUS | Status: DC
  Administered 2017-04-30: 50 ug/h via INTRAVENOUS
  Filled 2017-04-30: qty 250

## 2017-04-30 MED ORDER — IPRATROPIUM-ALBUTEROL 0.5-2.5 (3) MG/3ML IN SOLN
3.0000 mL | RESPIRATORY_TRACT | Status: DC
  Administered 2017-04-30 – 2017-05-01 (×10): 3 mL via RESPIRATORY_TRACT
  Filled 2017-04-30 (×10): qty 3

## 2017-04-30 MED ORDER — ONDANSETRON HCL 4 MG/2ML IJ SOLN: 4.0000 mg | Freq: Once | INTRAMUSCULAR | Status: DC | PRN

## 2017-04-30 MED ORDER — METHYLPREDNISOLONE SODIUM SUCC 40 MG IJ SOLR
40.0000 mg | Freq: Two times a day (BID) | INTRAMUSCULAR | Status: DC
  Administered 2017-04-30 – 2017-05-03 (×7): 40 mg via INTRAVENOUS
  Filled 2017-04-30 (×7): qty 1

## 2017-04-30 MED ORDER — ORAL CARE MOUTH RINSE
15.0000 mL | OROMUCOSAL | Status: DC
  Administered 2017-04-30 (×7): 15 mL via OROMUCOSAL

## 2017-04-30 MED ORDER — FENTANYL BOLUS VIA INFUSION
25.0000 ug | INTRAVENOUS | Status: DC | PRN
Start: 2017-04-30 — End: 2017-04-30
  Filled 2017-04-30: qty 25

## 2017-04-30 NOTE — Anesthesia Postprocedure Evaluation (Signed)
Anesthesia Post Note  Patient: Hannah Vaughan  Procedure(s) Performed: Procedure(s) (LRB): EXPLORATORY LAPAROTOMY, lysis of adhesions (N/A)  Patient location during evaluation: SICU Anesthesia Type: General Level of consciousness: sedated Pain management: pain level controlled Vital Signs Assessment: post-procedure vital signs reviewed and stable Respiratory status: patient remains intubated per anesthesia plan Cardiovascular status: stable Anesthetic complications: no     Last Vitals:  Vitals:   04/30/17 0500 04/30/17 0600  BP: (!) 102/54 (!) 97/52  Pulse: 83 82  Resp: 15 15  Temp:    SpO2: 98% 98%    Last Pain:  Vitals:   04/30/17 0400  TempSrc: Oral                 Michaele OfferSavage,  Mahati Vajda A

## 2017-04-30 NOTE — Transfer of Care (Signed)
Immediate Anesthesia Transfer of Care Note  Patient: Hannah Vaughan  Procedure(s) Performed: Procedure(s): EXPLORATORY LAPAROTOMY, lysis of adhesions (N/A)  Patient Location: PACU and ICU  Anesthesia Type:General  Level of Consciousness: unresponsive  Airway & Oxygen Therapy: Patient remains intubated per anesthesia plan  Post-op Assessment: Report given to RN and Post -op Vital signs reviewed and stable  Post vital signs: Reviewed and stable  Last Vitals:  Vitals:   04/29/17 1900 04/29/17 2100  BP: 137/72 109/62  Pulse: 86 85  Resp: 17 (!) 23  Temp:    SpO2: 99% 97%    Last Pain:  Vitals:   04/29/17 1711  TempSrc: Oral         Complications: No apparent anesthesia complications

## 2017-04-30 NOTE — Op Note (Signed)
  Procedure Date:  04/30/2017  Pre-operative Diagnosis:  Small bowel obstruction  Post-operative Diagnosis:  Closed loop small bowel obstruction due to adhesions  Procedure:  Exploratory Laparotomy with lysis of adhesions  Surgeon:  Howie IllJose Luis Kimarie Coor, MD  Anesthesia:  General endotracheal  Estimated Blood Loss:  25 ml  IV Fluids:  1900 ml  Urine Output:  100 ml  NG output:  1600 ml  Specimens:  None  Complications:  None  Indications for Procedure:  This is a 76 y.o. female who presents with abdominal pain and workup revealing a closed loop small bowel obstruction.  The risks of bleeding, abscess or infection, injury to surrounding structures, and need for further procedures were all discussed with the patient and her healthcare power of attorney and was willing to proceed.  Description of Procedure: The patient was correctly identified in the preoperative area and brought into the operating room.  The patient was placed supine with VTE prophylaxis in place.  Appropriate time-outs were performed.  Anesthesia was induced and the patient had significant emesis which was suctioned.  The patient was intubated.  Foley catheter was placed.  Appropriate antibiotics were infused.  The abdomen was prepped and draped in a sterile fashion.  A midline incision was made and electrocautery was used to dissect down the subcutaneous tissue to the fascia.  The fascia was incised and extended superiorly and inferiorly, entering the abdominal cavity.  Immediately, was noted to have dilated loops of small bowel.  The small bowel was eviscerated, revealing an area in the pelvis with adhesions which had created a closed loop small bowel obstruction with a small segment of bowel.  This was reduced and the bowel was freed of the adhesions.  The segment involved was hyperemic but was viable and perked up well.  There was no area of bowel necrosis and thus no resection was performed.  The small bowel was run from  Ligament of Treitz to the junction with the cecum and any remaining adhesions were lysed with cautery.  The NG tube was palpated in the stomach and measured to be at 55 cm at the nose, and contents from the small bowel were milked retrograde toward the NG tube.  The abdomen was thoroughly irrigated with 2L of warm saline.  60 ml of Exparel solution were infused in the peritoneum, fascia, and subcutaneous tissues. The fascia was closed using loop PDS sutures.  The midline wound was irrigated and closed using staples.  The wound was dressed with honeycomb dressing.   The patient was then transferred to the ICU intubated and sedated for further management.  The patient tolerated the procedure well and all counts were correct at the end of the case.   Howie IllJose Luis Joson Sapp, MD

## 2017-04-30 NOTE — Progress Notes (Signed)
eLink Physician-Brief Progress Note Patient Name: Hannah Vaughan DOB: December 06, 1940 MRN: [id number]   Date of Service  04/30/2017  HPI/Events of Note  p-lap SBO/ Lysis of adhesions , yhpotension -resolved Demand ischemia ABG >>metab alkalosis , suspect overventilation  eICU Interventions  Lower RR 12 ,allow pCO2 50-60 range Hypokalemia -repleted trend trops     Intervention Category Evaluation Type: New Patient Evaluation  [name] V. 04/30/2017, 1:40 AM

## 2017-04-30 NOTE — Consult Note (Signed)
Reason for Consult:respiratory failure elevated troponin Referring Physician: Dr Hannah Vaughan is an 76 y.o. female.  HPI: patient presents history of nausea vomiting history of mental retardation subsequently was found to have A closed loop obstruction.patient was found to have elevated troponins to be demand ischemia patient subsequently advised definitive for hypoxemia and low-grade temperature. Patient is postop from the procedure still intubated sedated  Past Medical History:  Diagnosis Date  . CAD (coronary artery disease)   . GERD (gastroesophageal reflux disease)   . History of hiatal hernia   . HTN (hypertension)   . Mental retardation   . Osteoarthritis   . Pneumococcus infection   . Seasonal allergies   . Seizure ([place])   . Seizure [facility])     Past Surgical History:  Procedure Laterality Date  . CHOLECYSTECTOMY      Family History  Problem Relation Age of Onset  . CAD Mother   . Congestive Heart Failure Mother   . CVA Father   . Heart attack Brother   . Hypertension Sister   . Diabetes Sister     Social History:  reports that she has never smoked. She has never used smokeless tobacco. She reports that she does not drink alcohol or use drugs.  Allergies:  Allergies  Allergen Reactions  . Depakote [Valproic Acid]   . Divalproex Sodium Other (See Comments)    Medications: I have reviewed the patient's current medications.  Results for orders placed or performed during the hospital encounter of 04/29/17 (from the past 48 hour(s))  CBC     Status: Abnormal   Collection Time: 04/29/17  6:10 PM  Result Value Ref Range   WBC 20.1 (H) 3.6 - 11.0 K/uL   RBC 4.89 3.80 - 5.20 MIL/uL   Hemoglobin 11.7 (L) 12.0 - 16.0 g/dL   HCT 37.8 35.0 - 47.0 %   MCV 77.3 (L) 80.0 - 100.0 fL   MCH 24.0 (L) 26.0 - 34.0 pg   MCHC 31.1 (L) 32.0 - 36.0 g/dL   RDW 20.8 (H) 11.5 - 14.5 %   Platelets 314 150 - 440 K/uL  Urinalysis, Routine w reflex microscopic     Status:  Abnormal   Collection Time: 04/29/17  6:10 PM  Result Value Ref Range   Color, Urine YELLOW (A) YELLOW   APPearance TURBID (A) CLEAR   Specific Gravity, Urine 1.018 1.005 - 1.030   pH 6.0 5.0 - 8.0   Glucose, UA NEGATIVE NEGATIVE mg/dL   Hgb urine dipstick SMALL (A) NEGATIVE   Bilirubin Urine NEGATIVE NEGATIVE   Ketones, ur NEGATIVE NEGATIVE mg/dL   Protein, ur 100 (A) NEGATIVE mg/dL   Nitrite NEGATIVE NEGATIVE   Leukocytes, UA MODERATE (A) NEGATIVE   RBC / HPF TOO NUMEROUS TO COUNT 0 - 5 RBC/hpf   WBC, UA TOO NUMEROUS TO COUNT 0 - 5 WBC/hpf   Bacteria, UA MANY (A) NONE SEEN   Squamous Epithelial / LPF NONE SEEN NONE SEEN   Mucous PRESENT   Comprehensive metabolic panel     Status: Abnormal   Collection Time: 04/29/17  6:43 PM  Result Value Ref Range   Sodium 143 135 - 145 mmol/L   Potassium 3.1 (L) 3.5 - 5.1 mmol/L   Chloride 95 (L) 101 - 111 mmol/L   CO2 35 (H) 22 - 32 mmol/L   Glucose, Bld 143 (H) 65 - 99 mg/dL   BUN 47 (H) 6 - 20 mg/dL   Creatinine, Ser 1.10 (H)  0.44 - 1.00 mg/dL   Calcium 9.7 8.9 - 10.3 mg/dL   Total Protein 6.9 6.5 - 8.1 g/dL   Albumin 3.2 (L) 3.5 - 5.0 g/dL   AST 24 15 - 41 U/L   ALT 25 14 - 54 U/L   Alkaline Phosphatase 141 (H) 38 - 126 U/L   Total Bilirubin 0.9 0.3 - 1.2 mg/dL   GFR calc non Af Amer 48 (L) >60 mL/min   GFR calc Af Amer 56 (L) >60 mL/min    Comment: (NOTE) The eGFR has been calculated using the CKD EPI equation. This calculation has not been validated in all clinical situations. eGFR's persistently <60 mL/min signify possible Chronic Kidney Disease.    Anion gap 13 5 - 15  Troponin I     Status: Abnormal   Collection Time: 04/29/17  6:43 PM  Result Value Ref Range   Troponin I 0.09 (HH) <0.03 ng/mL    Comment: CRITICAL RESULT CALLED TO, READ BACK BY AND VERIFIED WITH KAILEY WALKER ON 04/29/17 AT [facility]   Blood gas, arterial     Status: Abnormal   Collection Time: 04/30/17 12:09 AM  Result Value Ref Range   FIO2 0.70     Delivery systems VENTILATOR    Mode PRESSURE REGULATED VOLUME CONTROL    VT 500 mL   Peep/cpap 5.0 cm H20   pH, Arterial 7.57 (H) 7.350 - 7.450   pCO2 arterial 40 32.0 - 48.0 mmHg   pO2, Arterial 78 (L) 83.0 - 108.0 mmHg   Bicarbonate 36.6 (H) 20.0 - 28.0 mmol/L   Acid-Base Excess 13.4 (H) 0.0 - 2.0 mmol/L   O2 Saturation 97.1 %   Patient temperature 37.0    Collection site RIGHT RADIAL    Sample type ARTERIAL DRAW    Allens test (pass/fail) PASS PASS  Glucose, capillary     Status: Abnormal   Collection Time: 04/30/17 12:09 AM  Result Value Ref Range   Glucose-Capillary 130 (H) 65 - 99 mg/dL  MRSA PCR Screening     Status: None   Collection Time: 04/30/17 12:36 AM  Result Value Ref Range   MRSA by PCR NEGATIVE NEGATIVE    Comment:        The GeneXpert MRSA Assay (FDA approved for NASAL specimens only), is one component of a comprehensive MRSA colonization surveillance program. It is not intended to diagnose MRSA infection nor to guide or monitor treatment for MRSA infections.   Troponin I     Status: Abnormal   Collection Time: 04/30/17 12:46 AM  Result Value Ref Range   Troponin I 0.09 (HH) <0.03 ng/mL    Comment: CRITICAL VALUE NOTED. VALUE IS CONSISTENT WITH PREVIOUSLY REPORTED/CALLED VALUE [place]  Procalcitonin - Baseline     Status: None   Collection Time: 04/30/17 12:46 AM  Result Value Ref Range   Procalcitonin 0.47 ng/mL    Comment:        Interpretation: PCT (Procalcitonin) <= 0.5 ng/mL: Systemic infection (sepsis) is not likely. Local bacterial infection is possible. (NOTE)         ICU PCT Algorithm               Non ICU PCT Algorithm    ----------------------------     ------------------------------         PCT < 0.25 ng/mL                 PCT < 0.1 ng/mL     Stopping of antibiotics  Stopping of antibiotics       strongly encouraged.               strongly encouraged.    ----------------------------     ------------------------------       PCT  level decrease by               PCT < 0.25 ng/mL       >= 80% from peak PCT       OR PCT 0.25 - 0.5 ng/mL          Stopping of antibiotics                                             encouraged.     Stopping of antibiotics           encouraged.    ----------------------------     ------------------------------       PCT level decrease by              PCT >= 0.25 ng/mL       < 80% from peak PCT        AND PCT >= 0.5 ng/mL            Continuin g antibiotics                                              encouraged.       Continuing antibiotics            encouraged.    ----------------------------     ------------------------------     PCT level increase compared          PCT > 0.5 ng/mL         with peak PCT AND          PCT >= 0.5 ng/mL             Escalation of antibiotics                                          strongly encouraged.      Escalation of antibiotics        strongly encouraged.   Basic metabolic panel     Status: Abnormal   Collection Time: 04/30/17  1:21 AM  Result Value Ref Range   Sodium 146 (H) 135 - 145 mmol/L   Potassium 2.8 (L) 3.5 - 5.1 mmol/L   Chloride 101 101 - 111 mmol/L   CO2 35 (H) 22 - 32 mmol/L   Glucose, Bld 129 (H) 65 - 99 mg/dL   BUN 43 (H) 6 - 20 mg/dL   Creatinine, Ser 0.95 0.44 - 1.00 mg/dL   Calcium 9.0 8.9 - 10.3 mg/dL   GFR calc non Af Amer 58 (L) >60 mL/min   GFR calc Af Amer >60 >60 mL/min    Comment: (NOTE) The eGFR has been calculated using the CKD EPI equation. This calculation has not been validated in all clinical situations. eGFR's persistently <60 mL/min signify possible Chronic Kidney Disease.    Anion gap 10 5 - 15  Magnesium     Status: None  Collection Time: 04/30/17  1:21 AM  Result Value Ref Range   Magnesium 1.7 1.7 - 2.4 mg/dL  CBC     Status: Abnormal   Collection Time: 04/30/17  1:21 AM  Result Value Ref Range   WBC 3.5 (L) 3.6 - 11.0 K/uL   RBC 4.60 3.80 - 5.20 MIL/uL   Hemoglobin 11.2 (L) 12.0 - 16.0 g/dL    HCT 35.2 35.0 - 47.0 %   MCV 76.5 (L) 80.0 - 100.0 fL   MCH 24.4 (L) 26.0 - 34.0 pg   MCHC 31.9 (L) 32.0 - 36.0 g/dL   RDW 20.8 (H) 11.5 - 14.5 %   Platelets 237 150 - 440 K/uL  Lactic acid, plasma     Status: Abnormal   Collection Time: 04/30/17  1:21 AM  Result Value Ref Range   Lactic Acid, Venous 2.4 (HH) 0.5 - 1.9 mmol/L    Comment: CRITICAL RESULT CALLED TO, READ BACK BY AND VERIFIED WITH BARBARA THAO ON 04/30/17 AT [address] [place]   Glucose, capillary     Status: None   Collection Time: 04/30/17  3:29 AM  Result Value Ref Range   Glucose-Capillary 97 65 - 99 mg/dL  Lactic acid, plasma     Status: Abnormal   Collection Time: 04/30/17  3:52 AM  Result Value Ref Range   Lactic Acid, Venous 3.1 (HH) 0.5 - 1.9 mmol/L    Comment: CRITICAL RESULT CALLED TO, READ BACK BY AND VERIFIED WITH BARBARA THAO AT 2725 04/30/17 SDR   Blood gas, arterial     Status: Abnormal   Collection Time: 04/30/17  5:00 AM  Result Value Ref Range   FIO2 0.70    Delivery systems VENTILATOR    Mode PRESSURE REGULATED VOLUME CONTROL    VT 450 mL   Peep/cpap 5.0 cm H20   pH, Arterial 7.51 (H) 7.350 - 7.450   pCO2 arterial 45 32.0 - 48.0 mmHg   pO2, Arterial 97 83.0 - 108.0 mmHg   Bicarbonate 35.9 (H) 20.0 - 28.0 mmol/L   Acid-Base Excess 11.6 (H) 0.0 - 2.0 mmol/L   O2 Saturation 98.2 %   Patient temperature 37.0    Collection site RIGHT RADIAL    Sample type ARTERIAL DRAW    Allens test (pass/fail) PASS PASS   Mechanical Rate 15   Troponin I     Status: Abnormal   Collection Time: 04/30/17  7:31 AM  Result Value Ref Range   Troponin I 0.07 (HH) <0.03 ng/mL    Comment: CRITICAL VALUE NOTED. VALUE IS CONSISTENT WITH PREVIOUSLY REPORTED/CALLED VALUE/HKP  Lactic acid, plasma     Status: Abnormal   Collection Time: 04/30/17  7:31 AM  Result Value Ref Range   Lactic Acid, Venous 2.6 (HH) 0.5 - 1.9 mmol/L    Comment: CRITICAL RESULT CALLED TO, READ BACK BY AND VERIFIED WITH MYRA FLOWERS_0 /14/18 BY HKP    Phenytoin level, total     Status: Abnormal   Collection Time: 04/30/17  7:31 AM  Result Value Ref Range   Phenytoin Lvl 4.8 (L) 10.0 - 20.0 ug/mL  Glucose, capillary     Status: Abnormal   Collection Time: 04/30/17  7:41 AM  Result Value Ref Range   Glucose-Capillary 106 (H) 65 - 99 mg/dL  Phosphorus     Status: None   Collection Time: 04/30/17 10:54 AM  Result Value Ref Range   Phosphorus 3.1 2.5 - 4.6 mg/dL  Potassium     Status: None   Collection Time:  04/30/17 10:54 AM  Result Value Ref Range   Potassium 3.8 3.5 - 5.1 mmol/L  Glucose, capillary     Status: Abnormal   Collection Time: 04/30/17 11:27 AM  Result Value Ref Range   Glucose-Capillary 153 (H) 65 - 99 mg/dL    Ct Abdomen Pelvis W Contrast  Result Date: 04/29/2017 CLINICAL DATA:  Aspiration. Follow-up dilated small bowel seen on chest radiograph. EXAM: CT ABDOMEN AND PELVIS WITH CONTRAST TECHNIQUE: Multidetector CT imaging of the abdomen and pelvis was performed using the standard protocol following bolus administration of intravenous contrast. CONTRAST:  177m ISOVUE-300 IOPAMIDOL (ISOVUE-300) INJECTION 61% COMPARISON:  CT abdomen and pelvis March 31, 2017 and chest radiograph April 29, 2017 at 1752 hours FINDINGS: LOWER CHEST: New small pleural effusions. Old RIGHT posterior rib fractures. Heart is moderately enlarged and unchanged. 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 No pericardial effusion. Again noted is distal esophageal wall thickening and edema. 9 mm enhancing para esophageal lymph node. HEPATOBILIARY: Status post cholecystectomy. Mild postprocedural intrahepatic biliary dilatation. PANCREAS: Normal. SPLEEN: Normal. ADRENALS/URINARY TRACT: Kidneys are orthotopic, demonstrating symmetric enhancement. No nephrolithiasis, hydronephrosis or solid renal masses. The unopacified ureters are normal in course and caliber. Delayed imaging through the kidneys demonstrates symmetric prompt contrast excretion within the proximal  urinary collecting system. Urinary bladder is decompressed with disproportionate wall thickening and pericystic inflammation. Normal adrenal glands. STOMACH/BOWEL: Fluid distended stomach. Fluid felt distended small bowel at 3.7 cm. Central pelvis there are 2 transition points involving single loop of small bowel with pointed appearance of the mesentery and, surrounding free fluid. Mild colonic diverticulosis and decompressed colon. VASCULAR/LYMPHATIC: Aortoiliac vessels are normal in course and caliber. Moderate to severe atherosclerosis. No lymphadenopathy by CT size criteria. REPRODUCTIVE: Status post hysterectomy. OTHER: Small amount of ascites. MUSCULOSKELETAL: Nonacute. Grade 1 L5-S1 anterolisthesis on the basis of chronic L5 pars interarticularis defects. IMPRESSION: 1. High-grade small bowel obstruction at the level of the pelvis highly concerning for internal hernia resulting in closed loop obstruction. Small amount of reactive ascites. 2. Worsening distal esophagitis. 3. 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 Recommend correlation with urinary analysis. 4. New small pleural effusions. Stable cardiomegaly. 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 5. Acute findings discussed with and reconfirmed by Hannah.KEVIN PADUCHOWSKI on 04/29/2017 at 8:10 pm. Aortic Atherosclerosis (ICD10-I70.0). Electronically Signed   By: CElon AlasM.D.   On: 04/29/2017 20:13   Dg Chest Port 1 View  Result Date: 04/30/2017 CLINICAL DATA:  Endotracheal tube placement, postop EXAM: PORTABLE CHEST 1 VIEW COMPARISON:  04/29/2017, 11/26/2016 FINDINGS: Interval intubation. Tip of the endotracheal tube is about 2.2 cm superior to the carina. Cardiomegaly with probable calcification over left ventricular apex as before. Mild right upper lobe opacity. No pleural effusion. Aortic atherosclerosis. Small amount of pneumoperitoneum is presumably due to recent surgery IMPRESSION: 1. Endotracheal tube tip about 2.2 cm superior to carina 2. Small  amount of pneumoperitoneum presumably due to recent postoperative status 3. Cardiomegaly with calcification at the left ventricular apex 4. Mild atelectasis or infiltrate at the right upper lobe. Electronically Signed   By: KDonavan FoilM.D.   On: 04/30/2017 00:59   Dg Chest Portable 1 View  Result Date: 04/29/2017 CLINICAL DATA:  Aspiration. EXAM: PORTABLE CHEST 1 VIEW COMPARISON:  Chest x-ray November 26, 2016. FINDINGS: Cardiomegaly, unchanged. Left ventricular wall calcification again noted. Small left pleural effusion and adjacent left lower lobe opacity. Right lung is clear. No pneumothorax. No acute osseous abnormality. Old right-sided rib fractures. Partially visualized dilated loops of small bowel. Air-fluid  level in the stomach. Status post cholecystectomy. IMPRESSION: 1. Stable cardiomegaly and small left pleural effusion. Left basilar opacities are favored to represent atelectasis, given similar appearance to prior study, although aspiration or pneumonia cannot be entirely excluded. 2. Partially visualized dilated loops of small bowel. Recommend further evaluation with CT abdomen and pelvis. Electronically Signed   By: Titus Dubin M.D.   On: 04/29/2017 18:26    Review of Systems  Unable to perform ROS: Intubated   Blood pressure (!) 107/54, pulse (!) 110, temperature 98.6 F (37 C), resp. rate 15, height 5' (1.524 m), weight 51.8 kg (114 lb 3.2 oz), SpO2 94 %. Physical Exam  Nursing note and vitals reviewed. Constitutional: She appears well-developed and well-nourished. She appears lethargic.  HENT:  Head: Atraumatic.  Eyes: Pupils are equal, round, and reactive to light. Conjunctivae and EOM are normal.  Neck: Normal range of motion. Neck supple.  Cardiovascular: S1 normal, S2 normal, intact distal pulses and normal pulses.  An irregular rhythm present. Tachycardia present.   Murmur heard.  Systolic murmur is present with a grade of 2/6  Respiratory: Effort normal. She has  decreased breath sounds.  GI: Soft. Normal appearance and bowel sounds are normal.  Musculoskeletal: Normal range of motion.  Neurological: She appears lethargic.  Skin: Skin is warm, dry and intact.  Psychiatric:  Patient intubated sedated    Assessment/Plan: Intubated Respiratory failure Postop closed obstruction Coronary artery disease GERD Hypertension Urinary tract infection Nausea vomiting Mental retardation Hypoxemia Low-grade temperature Borderline troponin demand ischemia . Plan Continue ICU level care Continue vent support Broad spectrum antibiotic therapy Corrected electrolytes Borderline troponins probably demand ischemia Recommend conservative cardiac care  Khrystina Bonnes D Nancye Grumbine 04/30/2017, 4:14 PM

## 2017-04-30 NOTE — Clinical Social Work Note (Signed)
CSW has been informed by RN CM that patient is from a group home and appears to be from a [facility]. CSW has left Ulyess BlossomDonna Steele; [phone], the [facility] nurse evaluator, a message to discuss patient's status further. If patient is indeed from [facility], Lupita LeashDonna would be the individual that would make the decision as to whether patient can return or if they can meet patient's needs. CSW will continue to follow. York SpanielMonica Damiah Mcdonald MSW,LCSW [phone]

## 2017-04-30 NOTE — Progress Notes (Signed)
SURGICAL PROGRESS NOTE  Hospital Day(s): 1.   Post op day(s): 1 Day Post-Op.   Interval History: Patient seen and examined, underwent exploratory laparotomy with lysis of adhesions overnight for closed loop SBO. Patient remains intubated, was observed to have vomited upon induction of general anesthesia with likely aspiration pneumonia and suspected aspiration of emesis prior to ED presentation per medical record.  Review of Systems: Unable to assess due to intubation with sedation  Vital signs in last 24 hours: [min-max] current  Temp:  [97.5 F (36.4 C)-98 F (36.7 C)] 98 F (36.7 C) (08/14 0800) Pulse Rate:  [61-101] 87 (08/14 0800) Resp:  [12-23] 12 (08/14 0800) BP: (79-137)/(45-81) 93/52 (08/14 0800) SpO2:  [86 %-99 %] 98 % (08/14 0800) FiO2 (%):  [40 %-70 %] 40 % (08/14 0801) Weight:  [114 lb 3.2 oz (51.8 kg)-119 lb (54 kg)] 114 lb 3.2 oz (51.8 kg) (08/14 0030)     Height: 5' (152.4 cm) Weight: 114 lb 3.2 oz (51.8 kg) BMI (Calculated): 22.3   Intake/Output this shift:  No intake/output data recorded.   Intake/Output last 2 shifts:  @IOLAST2SHIFTS @   Physical Exam:  Constitutional: opens eyes, which track movement and voice, does not otherwise follow any instructions, NAD HENT: normocephalic without obvious abnormality  Eyes: PERRL, EOM's grossly intact and symmetric  Neuro: CN II - XII grossly intact and symmetric without deficit  Respiratory: intubated, breathing non-labored at rest  Cardiovascular: regular rate and sinus rhythm  Gastrointestinal: soft, non-tender to palpation, and non-distended, incision well-approximated without erythema or drainage Musculoskeletal: UE and LE FROM, no edema or wounds, motor grossly intact, unable to assess sensation  Labs:  CBC Latest Ref Rng & Units 04/30/2017 04/29/2017 04/01/2017  WBC 3.6 - 11.0 K/uL 3.5(L) 20.1(H) 9.6  Hemoglobin 12.0 - 16.0 g/dL 11.2(L) 11.7(L) 10.1(L)  Hematocrit 35.0 - 47.0 % 35.2 37.8 31.6(L)  Platelets  150 - 440 K/uL 237 314 192   CMP Latest Ref Rng & Units 04/30/2017 04/29/2017 04/02/2017  Glucose 65 - 99 mg/dL 161(W) 960(A) 540(J)  BUN 6 - 20 mg/dL 81(X) 91(Y) 78(G)  Creatinine 0.44 - 1.00 mg/dL 9.56 2.13(Y) 8.65  Sodium 135 - 145 mmol/L 146(H) 143 143  Potassium 3.5 - 5.1 mmol/L 2.8(L) 3.1(L) 3.6  Chloride 101 - 111 mmol/L 101 95(L) 106  CO2 22 - 32 mmol/L 35(H) 35(H) 30  Calcium 8.9 - 10.3 mg/dL 9.0 9.7 7.8(I)  Total Protein 6.5 - 8.1 g/dL - 6.9 -  Total Bilirubin 0.3 - 1.2 mg/dL - 0.9 -  Alkaline Phos 38 - 126 U/L - 141(H) -  AST 15 - 41 U/L - 24 -  ALT 14 - 54 U/L - 25 -    Imaging studies: Chest X-ray (04/30/2017) 1. Endotracheal tube tip about 2.2 cm superior to carina 2. Small amount of pneumoperitoneum presumably due to recent postoperative status 3. Cardiomegaly with calcification at the left ventricular apex 4. Mild atelectasis or infiltrate at the right upper lobe.  Assessment/Plan: (ICD-10's: K57.52) 76 y.o. female intubated and sedated in ICU for both witnessed and additionally suspected pre-operative aspiration 1 Day Post-Op s/p exploratory laparotomy with lysis of adhesions, no small bowel resection for closed loop small bowel obstruction, complicated by pertinent comorbidities including HTN, CAD, cognitive delay, seizure disorder, GERD with hiatal hernia, and osteoarthritis.   - pain control prn  - NPO, IV antibiotics   - supportive ICU care  - monitor abdominal exam, bowel function  - wean vent, medical management of comorbidities   -  DVT prophylaxis  All of the above findings and recommendations were discussed with the medical team. No family was available, ICU to update.  -- Scherrie GerlachJason E. Earlene Plateravis, MD, RPVI [place]: [facility] - Partnering for exceptional care. Office: [phone]

## 2017-04-30 NOTE — Progress Notes (Signed)
Fio2 decreased to 60%

## 2017-04-30 NOTE — Progress Notes (Signed)
[place] at [facility]                                                                                                                                                                                  Patient Demographics   Hannah Vaughan, is a 76 y.o. female, DOB - 1941-05-31, VJK:[id number]  Admit date - 04/29/2017   Admitting Physician Olean Ree, MD  Outpatient Primary MD for the patient is Kirk Ruths, MD   LOS - 1  Subjective: Patient admitted with small bowel obstruction, status post expiratory laparotomy with lysis of adhesions   Review of Systems:   CONSTITUTIONAL:Currently underwent   Vitals:   Vitals:   04/30/17 0900 04/30/17 1000 04/30/17 1100 04/30/17 1200  BP: (!) 91/49 (!) 82/42 (!) 89/56 (!) 87/66  Pulse: 83 87 (!) 101 (!) 114  Resp: 12 13 16 14   Temp:    98.6 F (37 C)  TempSrc:      SpO2: 97% 98% 96% 96%  Weight:      Height:        Wt Readings from Last 3 Encounters:  04/30/17 114 lb 3.2 oz (51.8 kg)  03/31/17 119 lb (54 kg)  03/30/17 119 lb (54 kg)     Intake/Output Summary (Last 24 hours) at 04/30/17 1253 Last data filed at 04/30/17 1135  Gross per 24 hour  Intake          3249.46 ml  Output             1960 ml  Net          1289.46 ml    Physical Exam:   GENERALCritically ill HeAD, EYES, EARS, NOSE AND THROAT: Atraumatic, normocephalic. . Pupils equal and reactive to light. Sclerae anicteric. No conjunctival injection. No oro-pharyngeal erythema.  NECK: Supple. There is no jugular venous distention. No bruits, no lymphadenopathy, no thyromegaly.  HEART: Regular rate and rhythm,. No murmurs, no rubs, no clicks.  LUNGS: Clear to auscultation bilaterally. No rales or rhonchi. No wheezes.  ABDOMEN:  postop  EXTREMITIES: No evidence of any cyanosis, clubbing, or peripheral edema.  +2 pedal and radial pulses bilaterally.  NEUROLOGIC:  on the vent eyes open  SKIN: Moist and warm with no rashes appreciated.   Psych: On the ventilator  LN: No inguinal LN enlargement    Antibiotics   Anti-infectives    Start     Dose/Rate Route Frequency Ordered Stop   04/30/17 0600  piperacillin-tazobactam (ZOSYN) IVPB 3.375 g     3.375 g 12.5 mL/hr over 240 Minutes Intravenous Every 8 hours 04/29/17 2113     04/29/17 2030  piperacillin-tazobactam (ZOSYN) IVPB  3.375 g     3.375 g 100 mL/hr over 30 Minutes Intravenous  Once 04/29/17 2027 04/29/17 2129   04/29/17 1900  cefTRIAXone (ROCEPHIN) 1 g in dextrose 5 % 50 mL IVPB     1 g 100 mL/hr over 30 Minutes Intravenous  Once 04/29/17 1851 04/29/17 1959      Medications   Scheduled Meds: . chlorhexidine gluconate (MEDLINE KIT)  15 mL Mouth Rinse BID  . heparin  5,000 Units Subcutaneous Q8H  . insulin aspart  0-9 Units Subcutaneous Q4H  . ipratropium-albuterol  3 mL Nebulization Q4H  . ketorolac  15 mg Intravenous Q6H  . mouth rinse  15 mL Mouth Rinse 10 times per day  . methylPREDNISolone (SOLU-MEDROL) injection  40 mg Intravenous Q12H  . metoprolol tartrate  5 mg Intravenous Q6H  . pantoprazole (PROTONIX) IV  40 mg Intravenous QHS  . PHENObarbital  65 mg Intravenous QHS  . phenytoin (DILANTIN) IV  100 mg Intravenous Q8H   Continuous Infusions: . fentaNYL infusion INTRAVENOUS Stopped (04/30/17 1057)  . lactated ringers 125 mL/hr (04/30/17 0912)  . piperacillin-tazobactam (ZOSYN)  IV Stopped (04/30/17 0926)   PRN Meds:.fentaNYL, fentaNYL (SUBLIMAZE) injection, hydrALAZINE, HYDROmorphone (DILAUDID) injection, ipratropium-albuterol, midazolam, midazolam, ondansetron **OR** ondansetron (ZOFRAN) IV, ondansetron (ZOFRAN) IV   Data Review:   Micro Results Recent Results (from the past 240 hour(s))  MRSA PCR Screening     Status: None   Collection Time: 04/30/17 12:36 AM  Result Value Ref Range Status   MRSA by PCR NEGATIVE NEGATIVE Final    Comment:        The GeneXpert MRSA Assay (FDA approved for NASAL specimens only), is one component of  a comprehensive MRSA colonization surveillance program. It is not intended to diagnose MRSA infection nor to guide or monitor treatment for MRSA infections.     Radiology Reports Ct Abdomen Pelvis W Contrast  Result Date: 04/29/2017 CLINICAL DATA:  Aspiration. Follow-up dilated small bowel seen on chest radiograph. EXAM: CT ABDOMEN AND PELVIS WITH CONTRAST TECHNIQUE: Multidetector CT imaging of the abdomen and pelvis was performed using the standard protocol following bolus administration of intravenous contrast. CONTRAST:  131m ISOVUE-300 IOPAMIDOL (ISOVUE-300) INJECTION 61% COMPARISON:  CT abdomen and pelvis March 31, 2017 and chest radiograph April 29, 2017 at 1752 hours FINDINGS: LOWER CHEST: New small pleural effusions. Old RIGHT posterior rib fractures. Heart is moderately enlarged and unchanged. 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 No pericardial effusion. Again noted is distal esophageal wall thickening and edema. 9 mm enhancing para esophageal lymph node. HEPATOBILIARY: Status post cholecystectomy. Mild postprocedural intrahepatic biliary dilatation. PANCREAS: Normal. SPLEEN: Normal. ADRENALS/URINARY TRACT: Kidneys are orthotopic, demonstrating symmetric enhancement. No nephrolithiasis, hydronephrosis or solid renal masses. The unopacified ureters are normal in course and caliber. Delayed imaging through the kidneys demonstrates symmetric prompt contrast excretion within the proximal urinary collecting system. Urinary bladder is decompressed with disproportionate wall thickening and pericystic inflammation. Normal adrenal glands. STOMACH/BOWEL: Fluid distended stomach. Fluid felt distended small bowel at 3.7 cm. Central pelvis there are 2 transition points involving single loop of small bowel with pointed appearance of the mesentery and, surrounding free fluid. Mild colonic diverticulosis and decompressed colon. VASCULAR/LYMPHATIC: Aortoiliac vessels are normal in course and caliber.  Moderate to severe atherosclerosis. No lymphadenopathy by CT size criteria. REPRODUCTIVE: Status post hysterectomy. OTHER: Small amount of ascites. MUSCULOSKELETAL: Nonacute. Grade 1 L5-S1 anterolisthesis on the basis of chronic L5 pars interarticularis defects. IMPRESSION: 1. High-grade small bowel obstruction at the level of the pelvis  highly concerning for internal hernia resulting in closed loop obstruction. Small amount of reactive ascites. 2. Worsening distal esophagitis. 3. 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 Recommend correlation with urinary analysis. 4. New small pleural effusions. Stable cardiomegaly. 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 5. Acute findings discussed with and reconfirmed by Dr.KEVIN PADUCHOWSKI on 04/29/2017 at 8:10 pm. Aortic Atherosclerosis (ICD10-I70.0). Electronically Signed   By: Elon Alas M.D.   On: 04/29/2017 20:13   Ct Abdomen Pelvis W Contrast  Result Date: 03/31/2017 CLINICAL DATA:  Vomiting for 2 days.  Prior cholecystectomy. EXAM: CT ABDOMEN AND PELVIS WITH CONTRAST TECHNIQUE: Multidetector CT imaging of the abdomen and pelvis was performed using the standard protocol following bolus administration of intravenous contrast. CONTRAST:  61m ISOVUE-300 IOPAMIDOL (ISOVUE-300) INJECTION 61% COMPARISON:  None. FINDINGS: Lower chest: Mild patchy tree-in-bud opacities with small parenchymal band in the left lower lobe. Patulous fluid-filled lower thoracic esophagus with associated circumferential esophageal wall thickening. Cardiomegaly. Coronary atherosclerosis. Hepatobiliary: Normal liver size. No liver mass. Cholecystectomy. Bile ducts are within normal post cholecystectomy limits with common bile duct diameter 5 mm. Pancreas: Normal, with no mass or duct dilation. Spleen: Normal size. No mass. Adrenals/Urinary Tract: Normal adrenals. Subcentimeter hypodense renal cortical lesion in the anterior interpolar left kidney is too small to characterize and requires no further  follow-up. Otherwise normal kidneys, with no hydronephrosis. There is irregular wall thickening with mucosal hyperenhancement involving the posterior bladder wall bilaterally with associated haziness of the perivesical fat. A few scattered gas bubbles are noted in the nondependent bladder. Stomach/Bowel: Small hiatal hernia. Otherwise grossly normal stomach. Normal caliber small bowel with no small bowel wall thickening. Appendix not discretely visualized. No pericecal inflammatory changes. Normal large bowel with no diverticulosis, large bowel wall thickening or pericolonic fat stranding. Vascular/Lymphatic: Atherosclerotic nonaneurysmal abdominal aorta. Patent portal, splenic, hepatic and renal veins. No pathologically enlarged lymph nodes in the abdomen or pelvis. Reproductive: Status post hysterectomy, with no abnormal findings at the vaginal cuff. No adnexal mass. Other: No pneumoperitoneum, ascites or focal fluid collection. Musculoskeletal: No aggressive appearing focal osseous lesions. Sclerotic anterior left acetabular lesion is nonspecific and probably a benign bone island. Marked thoracolumbar spondylosis. Bilateral L5 pars defects with moderate degenerative disc disease and 4 mm anterolisthesis at L5-S1. Lateral left tenth rib nondisplaced fracture, which may be acute. IMPRESSION: 1. Irregular wall thickening and mucosal hyperenhancement involving the posterior bladder wall bilaterally with associated perivesical fat haziness, nonspecific, most likely representing an unusual distribution of acute cystitis. Correlation with urinalysis advised. Cystoscopic correlation could be obtained as clinically warranted. Tiny gas bubbles in the nondependent bladder are presumably related to instrumentation, correlate with clinical history . 2. Small hiatal hernia . Patulous fluid-filled lower thoracic esophagus, indicating esophageal dysmotility and/ or gastroesophageal reflux. Associated circumferential lower  thoracic esophageal wall thickening, nonspecific, most commonly due to reflux esophagitis. Endoscopic correlation could be obtained as clinically warranted . 3. Mild patchy tree-in-bud opacities at the left lung base, indicating a nonspecific infectious or inflammatory bronchiolitis, with the differential including aspiration . 4. Nondisplaced lateral left tenth rib fracture, which may be acute. 5. Aortic Atherosclerosis (ICD10-I70.0). Coronary atherosclerosis. Cardiomegaly. Electronically Signed   By: JIlona SorrelM.D.   On: 03/31/2017 15:53   Dg Chest Port 1 View  Result Date: 04/30/2017 CLINICAL DATA:  Endotracheal tube placement, postop EXAM: PORTABLE CHEST 1 VIEW COMPARISON:  04/29/2017, 11/26/2016 FINDINGS: Interval intubation. Tip of the endotracheal tube is about 2.2 cm superior to the carina. Cardiomegaly with probable calcification over left ventricular  apex as before. Mild right upper lobe opacity. No pleural effusion. Aortic atherosclerosis. Small amount of pneumoperitoneum is presumably due to recent surgery IMPRESSION: 1. Endotracheal tube tip about 2.2 cm superior to carina 2. Small amount of pneumoperitoneum presumably due to recent postoperative status 3. Cardiomegaly with calcification at the left ventricular apex 4. Mild atelectasis or infiltrate at the right upper lobe. Electronically Signed   By: Donavan Foil M.D.   On: 04/30/2017 00:59   Dg Chest Portable 1 View  Result Date: 04/29/2017 CLINICAL DATA:  Aspiration. EXAM: PORTABLE CHEST 1 VIEW COMPARISON:  Chest x-ray November 26, 2016. FINDINGS: Cardiomegaly, unchanged. Left ventricular wall calcification again noted. Small left pleural effusion and adjacent left lower lobe opacity. Right lung is clear. No pneumothorax. No acute osseous abnormality. Old right-sided rib fractures. Partially visualized dilated loops of small bowel. Air-fluid level in the stomach. Status post cholecystectomy. IMPRESSION: 1. Stable cardiomegaly and small left  pleural effusion. Left basilar opacities are favored to represent atelectasis, given similar appearance to prior study, although aspiration or pneumonia cannot be entirely excluded. 2. Partially visualized dilated loops of small bowel. Recommend further evaluation with CT abdomen and pelvis. Electronically Signed   By: Titus Dubin M.D.   On: 04/29/2017 18:26     CBC  Recent Labs Lab 04/29/17 1810 04/30/17 0121  WBC 20.1* 3.5*  HGB 11.7* 11.2*  HCT 37.8 35.2  PLT 314 237  MCV 77.3* 76.5*  MCH 24.0* 24.4*  MCHC 31.1* 31.9*  RDW 20.8* 20.8*    Chemistries   Recent Labs Lab 04/29/17 1843 04/30/17 0121 04/30/17 1054  NA 143 146*  --   K 3.1* 2.8* 3.8  CL 95* 101  --   CO2 35* 35*  --   GLUCOSE 143* 129*  --   BUN 47* 43*  --   CREATININE 1.10* 0.95  --   CALCIUM 9.7 9.0  --   MG  --  1.7  --   AST 24  --   --   ALT 25  --   --   ALKPHOS 141*  --   --   BILITOT 0.9  --   --    ------------------------------------------------------------------------------------------------------------------ estimated creatinine clearance is 37.3 mL/min (by C-G formula based on SCr of 0.95 mg/dL). ------------------------------------------------------------------------------------------------------------------ No results for input(s): HGBA1C in the last 72 hours. ------------------------------------------------------------------------------------------------------------------ No results for input(s): CHOL, HDL, LDLCALC, TRIG, CHOLHDL, LDLDIRECT in the last 72 hours. ------------------------------------------------------------------------------------------------------------------ No results for input(s): TSH, T4TOTAL, T3FREE, THYROIDAB in the last 72 hours.  Invalid input(s): FREET3 ------------------------------------------------------------------------------------------------------------------ No results for input(s): VITAMINB12, FOLATE, FERRITIN, TIBC, IRON, RETICCTPCT in the last 72  hours.  Coagulation profile No results for input(s): INR, PROTIME in the last 168 hours.  No results for input(s): DDIMER in the last 72 hours.  Cardiac Enzymes  Recent Labs Lab 04/29/17 1843 04/30/17 0046 04/30/17 0731  TROPONINI 0.09* 0.09* 0.07*   ------------------------------------------------------------------------------------------------------------------ Invalid input(s): POCBNP    Assessment & Plan  Patient 76 year old with small bowel obstruction status post surgery now on the ventilator   * Acute respiratory failure with likely aspiration pneumonia Continue IV Zosyn Vent management per the intensivist  *Hypotension suspected sepsis as well as small bowel obstruction and the ventilator IV fluids May need pressors per the intensive  *SBO (small bowel obstruction) ([place]) -  S/p surgery   *CAD (coronary artery disease) - patient's troponin was very mildly elevated, this is likely demand ischemia given her significant nausea and vomiting, we will trend her  cardiac enzymes postop, continue home medications  *  Seizure disorder ([place]) - continue home dose antiepileptics  * GERD (gastroesophageal reflux disease) - home dose PPI      Code Status Orders        Start     Ordered   04/29/17 2110  Full code  Continuous     04/29/17 2113    Code Status History    Date Active Date Inactive Code Status Order ID Comments User Context   03/31/2017 11:16 PM 04/02/2017  8:03 PM Full Code [id number]  Lance Coon, MD Inpatient   11/26/2016  3:02 PM 11/28/2016  6:18 PM Full Code [id number]  Hillary Bow, MD ED   05/06/2015 11:52 PM 05/09/2015  8:04 PM Full Code [id number]  Lance Coon, MD Inpatient              DVT Prophylaxis  heparin  Lab Results  Component Value Date   PLT 237 04/30/2017     Time Spent in minutes   64mn  Greater than 50% of time spent in care coordination and counseling patient regarding the condition and plan of care.   PDustin FlockM.D on 04/30/2017 at 12:53 PM  Between 7am to 6pm - Pager - [phone]  After 6pm go to [email or web address] - password [email or web address]   Office  [phone]

## 2017-04-30 NOTE — Progress Notes (Signed)
[place] for Phenytoin Dosing  Allergies  Allergen Reactions  . Depakote [Valproic Acid]   . Divalproex Sodium Other (See Comments)    Patient Measurements: Height: 5' (152.4 cm) Weight: 114 lb 3.2 oz (51.8 kg) IBW/kg (Calculated) : 45.5  Vital Signs: Temp: 98.6 F (37 C) (08/14 1200) Temp Source: Axillary (08/14 0800) BP: 87/66 (08/14 1200) Pulse Rate: 114 (08/14 1200) Intake/Output from previous day: 08/13 0701 - 08/14 0700 In: 2534.5 [I.V.:2534.5] Out: 1900 [Urine:275; Blood:25] Intake/Output from this shift: Total I/O In: 715 [I.V.:505; NG/GT:60; IV Piggyback:150] Out: 60 [Urine:60]  Labs:  Recent Labs  04/29/17 1810 04/29/17 1843 04/30/17 0121 04/30/17 1054  WBC 20.1*  --  3.5*  --   HGB 11.7*  --  11.2*  --   HCT 37.8  --  35.2  --   PLT 314  --  237  --   CREATININE  --  1.10* 0.95  --   MG  --   --  1.7  --   PHOS  --   --   --  3.1  ALBUMIN  --  3.2*  --   --   PROT  --  6.9  --   --   AST  --  24  --   --   ALT  --  25  --   --   ALKPHOS  --  141*  --   --   BILITOT  --  0.9  --   --    Estimated Creatinine Clearance: 37.3 mL/min (by C-G formula based on SCr of 0.95 mg/dL).    Medications:  Scheduled:  . chlorhexidine gluconate (MEDLINE KIT)  15 mL Mouth Rinse BID  . heparin  5,000 Units Subcutaneous Q8H  . insulin aspart  0-9 Units Subcutaneous Q4H  . ipratropium-albuterol  3 mL Nebulization Q4H  . ketorolac  15 mg Intravenous Q6H  . mouth rinse  15 mL Mouth Rinse 10 times per day  . methylPREDNISolone (SOLU-MEDROL) injection  40 mg Intravenous Q12H  . metoprolol tartrate  5 mg Intravenous Q6H  . pantoprazole (PROTONIX) IV  40 mg Intravenous QHS  . PHENObarbital  65 mg Intravenous QHS  . phenytoin (DILANTIN) IV  100 mg Intravenous Q8H   Infusions:  . fentaNYL infusion INTRAVENOUS Stopped (04/30/17 1057)  . lactated ringers 125 mL/hr (04/30/17 0912)  . piperacillin-tazobactam (ZOSYN)  IV  Stopped (04/30/17 5170)    Assessment: Patient has known history of seizure ([place]), and patient takes 100 mg phenytoin ER capsule three times daily at home. Patient is currently on phenobarbital 65 mg daily injection, and patient's albumin observed at 3.2 on 8/13. Patient has had episodes of vomiting, abdominal pain since 8/11. Patient was initiated on phenytoin 100 mg IV every 8 hours. Patient's 8/14 phenytoin level came back at 4.8.  Goal of Therapy:  Total phenytoin levels between 10-20 mg/L  Plan:  Corrected phenytoin level based on 8/14 phenytoin level and 8/13 albumin level is 6.5. This is subtherapeutic, therefore we will adjust phenytoin to 125 mg IV every 8 hours. Monitor phenytoin and albumin levels every 3 days while patient is in the ICU.  Brett Fairy 04/30/2017,2:10 PM

## 2017-04-30 NOTE — Progress Notes (Signed)
Pt. Was suctioned prior to extubation for a small amount of thick brown secretions. Per Dr. Laurence Alyam's orders, she was extubated to 2 L nasal cannula. Sa02= 95%.

## 2017-04-30 NOTE — Progress Notes (Signed)
Good day. Tolerated sedation vacation and ventilator weaning well. Extubated at 1530 to 2 liters nasal cannula. Noted that patient makes noises but does not attempt to communicate much. 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 Reported by caregivers that this is baseline behavior for her. Nasogastric tube remains in left nare to low wall intermittent suction and output remains brown in color. Patient does not attempt to pull NG tube.Lactated Ringers IVF at 125 ml/hour.

## 2017-04-30 NOTE — Progress Notes (Signed)
Pharmacy Antibiotic Note  Hannah Vaughan is a 76 y.o. female admitted on 04/29/2017 with UTI and a known aspiration event.  Pharmacy has been consulted for ampicillin/sublactam (Unasyn) dosing.  Plan: After discussion with Dr. Belia HemanKasa, antibiotics will be changed from piperacillin/tazobactam (Zosyn) to ampicillin/sublactam 3g every 6 hours (Unasyn).  Height: 5' (152.4 cm) Weight: 114 lb 3.2 oz (51.8 kg) IBW/kg (Calculated) : 45.5  Temp (24hrs), Avg:97.7 F (36.5 C), Min:97.5 F (36.4 C), Max:98 F (36.7 C)   Recent Labs Lab 04/29/17 1810 04/29/17 1843 04/30/17 0121 04/30/17 0352 04/30/17 0731  WBC 20.1*  --  3.5*  --   --   CREATININE  --  1.10* 0.95  --   --   LATICACIDVEN  --   --  2.4* 3.1* 2.6*    Estimated Creatinine Clearance: 37.3 mL/min (by C-G formula based on SCr of 0.95 mg/dL).    Allergies  Allergen Reactions  . Depakote [Valproic Acid]   . Divalproex Sodium Other (See Comments)    Antimicrobials this admission: Ceftriaxone 1g 8/13>>8/13 Piperacillin/tazobactam 3.375g 8/13 >> 8/14 Ampicillin/sublactam 3g 8/14 >>   Dose adjustments this admission: Stop Zosyn. Replace with ampicillin/sublactam 3g every 6 hours. Monitor renal function; if CrCL<30 mL/min, change frequency to every 12 hours.  Microbiology results: 8/13 UCx: SENT 8/14 MRSA PCR (nasopharygeal): NEGATIVE   Thank you for allowing pharmacy to be a part of this patient's care.  Erskine EmeryDaniel  Kaitlin Alcindor 04/30/2017 11:10 AM

## 2017-04-30 NOTE — Progress Notes (Signed)
Initial Nutrition Assessment  DOCUMENTATION CODES:   Not applicable  INTERVENTION:  If patient expected to remain intubated greater than 24-48 hours, recommend trial of trickle tube feeds with Vital High Protein at 20 ml/hr when okay per surgery. As resumption of peristalsis occurs quicker in small bowel than in stomach or large bowel following GI surgery, could always advance tube post-pyloric for initiation of trickle feeds (when no longer needed to be at LIS in stomach). If patient remains intubated, goal TF regimen is Vital High Protein at 40 ml/hr (960 ml total goal volume). Provides 960 kcal, 84 grams of protein, 806 ml H2O.  If patient requires initiation of TF, recommend liquid multivitamin with minerals daily per tube as goal TF regimen does not meet 100% RDIs.  Once patient is extubated and able to follow commands, recommend SLP evaluation prior to initiation of diet. She is on a pureed diet at baseline (sister was unsure of baseline fluid consistency).  NUTRITION DIAGNOSIS:   Inadequate oral intake related to inability to eat as evidenced by NPO status.  GOAL:   Provide needs based on ASPEN/SCCM guidelines  MONITOR:   Diet advancement, Vent status, Labs, Weight trends, TF tolerance, Skin, I & O's  REASON FOR ASSESSMENT:   Ventilator    ASSESSMENT:   76 year old female with PMHx of mental retardation who is nonverbal at baseline, HTN, seizure, CAD, OA, GERD, hx of hiatal hernia who presented from [name] with persistent nausea and vomiting found to have closed loop obstruction of small bowel due to adhesions. Patient now s/p exploratory laparotomy with lysis of adhesions on 8/14.   -Of note after anesthesia was induced for operation overnight, patient had significant emesis, which was suctioned. She was intubated for operation and remains intubated in ICU at this time.  Spoke with patient's sister Marybelle Killings. She reports patient resides at [facility]  [facility] home. Patient is on a pureed diet at baseline. She cannot remember if patient is currently on thickened liquids, but knows she has been in the past. She is not sure of patient's recent nutrition or weight history. Attempted to call [name] but did not get answer after transferred by operator. Per chart patient was historically 139-146 lbs in 2016. For the past year her weight has fluctuated between 108-119 lbs.   Access: NGT placed 04/29/2017 currently to LIS  Patient is currently intubated on ventilator support MV: 5.3 L/min Temp (24hrs), Avg:97.7 F (36.5 C), Min:97.5 F (36.4 C), Max:98 F (36.7 C)  Propofol: N/A  Medications reviewed and include: Novolog 0-9 units Q4hrs, methylprednisolone 40 mg Q12hrs, pantoprazole, fentanyl gtt, LR @ 125 ml/hr, Zosyn.  Labs reviewed: CBG 97-130, Sodium 146, Potassium 2.8, CO2 35, BUN 43, elevated Troponin, Lactic Acid 2.6.  Nutrition-Focused physical exam completed. Findings are mild-moderate fat depletion of upper arm region, no muscle depletion, and no edema. Abdomen soft on exam (no distention). No overt signs of micronutrient deficiencies on exam of hair, nails, skin, or eyes. Unable to visualize mouth well d/t intubation. Noted raised bump on scalp at edge of hairline.    Patient does not meet criteria for malnutrition at this time.  Patient discussed on rounds this AM. Plan is to wean sedation today to see if patient will tolerate extubation. Potassium and magnesium being replaced. It was also reported during rounds that pt has hx of DM.  Diet Order:  Diet NPO time specified  Skin:  Wound (see comment) (closed incision abdomen)  Last BM:  Unknown  Height:   Ht Readings from Last 1 Encounters:  04/30/17 5' (1.524 m)    Weight:   Wt Readings from Last 1 Encounters:  04/30/17 114 lb 3.2 oz (51.8 kg)    Ideal Body Weight:  45.5 kg  BMI:  Body mass index is 22.3 kg/m.  Estimated Nutritional Needs:   Kcal:   988 (PSU 2003b w/ MSJ 945, Ve 5.3, Tmax 36.7)  Protein:  67-83 grams (1.3-1.6 grams/kg)  Fluid:  1.3-1.5 L/day (25-30 ml/kg)  EDUCATION NEEDS:   No education needs identified at this time  Helane RimaLeanne Evyn Kooyman, MS, RD, LDN Pager: [phone] After Hours Pager: [phone]

## 2017-04-30 NOTE — Care Management (Signed)
[facility]. CSW updated. Guardian listed as Sister Marybelle KillingsFaye Dickey [phone].

## 2017-05-01 ENCOUNTER — Inpatient Hospital Stay: Payer: Medicare Other

## 2017-05-01 LAB — BASIC METABOLIC PANEL
Anion gap: 8 (ref 5–15)
BUN: 37 mg/dL — ABNORMAL HIGH (ref 6–20)
CALCIUM: 8.2 mg/dL — AB (ref 8.9–10.3)
CO2: 30 mmol/L (ref 22–32)
CREATININE: 0.96 mg/dL (ref 0.44–1.00)
Chloride: 105 mmol/L (ref 101–111)
GFR, EST NON AFRICAN AMERICAN: 57 mL/min — AB (ref >60)
Glucose, Bld: 117 mg/dL — ABNORMAL HIGH (ref 65–99)
Potassium: 3.4 mmol/L — ABNORMAL LOW (ref 3.5–5.1)
SODIUM: 143 mmol/L (ref 135–145)

## 2017-05-01 LAB — CBC
HEMATOCRIT: 28.8 % — AB (ref 35.0–47.0)
Hemoglobin: 9 g/dL — ABNORMAL LOW (ref 12.0–16.0)
MCH: 24.3 pg — ABNORMAL LOW (ref 26.0–34.0)
MCHC: 31.2 g/dL — AB (ref 32.0–36.0)
MCV: 77.9 fL — ABNORMAL LOW (ref 80.0–100.0)
PLATELETS: 181 10*3/uL (ref 150–440)
RBC: 3.7 MIL/uL — ABNORMAL LOW (ref 3.80–5.20)
RDW: 20.8 % — AB (ref 11.5–14.5)
WBC: 7.2 10*3/uL (ref 3.6–11.0)

## 2017-05-01 LAB — GLUCOSE, CAPILLARY
GLUCOSE-CAPILLARY: 105 mg/dL — AB (ref 65–99)
GLUCOSE-CAPILLARY: 116 mg/dL — AB (ref 65–99)
GLUCOSE-CAPILLARY: 103 mg/dL — AB (ref 65–99)
Glucose-Capillary: 100 mg/dL — ABNORMAL HIGH (ref 65–99)
Glucose-Capillary: 113 mg/dL — ABNORMAL HIGH (ref 65–99)
Glucose-Capillary: 113 mg/dL — ABNORMAL HIGH (ref 65–99)

## 2017-05-01 LAB — MAGNESIUM: Magnesium: 2.3 mg/dL (ref 1.7–2.4)

## 2017-05-01 LAB — LACTIC ACID, PLASMA: LACTIC ACID, VENOUS: 2.6 mmol/L — AB (ref 0.5–1.9)

## 2017-05-01 LAB — PROCALCITONIN: Procalcitonin: 3.97 ng/mL

## 2017-05-01 MED ORDER — METOPROLOL TARTRATE 5 MG/5ML IV SOLN
2.5000 mg | Freq: Four times a day (QID) | INTRAVENOUS | Status: DC
  Administered 2017-05-01 – 2017-05-03 (×9): 2.5 mg via INTRAVENOUS
  Filled 2017-05-01 (×9): qty 5

## 2017-05-01 MED ORDER — POTASSIUM CHLORIDE 10 MEQ/100ML IV SOLN
10.0000 meq | INTRAVENOUS | Status: AC
  Administered 2017-05-01 (×4): 10 meq via INTRAVENOUS
  Filled 2017-05-01 (×4): qty 100

## 2017-05-01 NOTE — Progress Notes (Signed)
SURGICAL PROGRESS NOTE  Hospital Day(s): 2.   Post op day(s): 2 Days Post-Op.   Interval History: Patient seen and examined, extubated yesterday with NG tube self-removed, reinserted overnight, and then again self-removed once more this morning. Though patient is essentially non-verbal, patient's long-time caregiver interprets patient's motions and body language as communicating she reports she is feeling better only mild abdominal pain and denies nausea.  Review of Systems: Limited to HPI as described above due to patient's baseline cognitive disability  Vital signs in last 24 hours: [min-max] current  Temp:  [98.2 F (36.8 C)-99.2 F (37.3 C)] 98.6 F (37 C) (08/15 0738) Pulse Rate:  [82-114] 83 (08/15 0738) Resp:  [11-18] 17 (08/15 0738) BP: (82-138)/(42-87) 126/87 (08/15 0800) SpO2:  [90 %-100 %] 100 % (08/15 0738) FiO2 (%):  [35 %-40 %] 35 % (08/14 1520)     Height: 5' (152.4 cm) Weight: 114 lb 3.2 oz (51.8 kg) BMI (Calculated): 22.3   Intake/Output this shift:  Total I/O In: 550 [I.V.:250; IV Piggyback:300] Out: -    Intake/Output last 2 shifts:  @IOLAST2SHIFTS @   Physical Exam:  Constitutional: alert, cooperative and no distress  HENT: normocephalic without obvious abnormality  Eyes: PERRL, EOM's grossly intact and symmetric  Neuro: CN II - XII grossly intact and symmetric without deficit  Respiratory: breathing non-labored at rest  Cardiovascular: regular rate and sinus rhythm  Gastrointestinal: soft and non-distended with no indication of abdominal tenderness, incision well-approximated with no surrounding erythema or drainage Musculoskeletal: UE and LE FROM, no edema or wounds, motor and sensation grossly intact  Labs:  CBC Latest Ref Rng & Units 05/01/2017 04/30/2017 04/29/2017  WBC 3.6 - 11.0 K/uL 7.2 3.5(L) 20.1(H)  Hemoglobin 12.0 - 16.0 g/dL 9.0(L) 11.2(L) 11.7(L)  Hematocrit 35.0 - 47.0 % 28.8(L) 35.2 37.8  Platelets 150 - 440 K/uL 181 237 314   CMP  Latest Ref Rng & Units 05/01/2017 04/30/2017 04/30/2017  Glucose 65 - 99 mg/dL 161(W) - 960(A)  BUN 6 - 20 mg/dL 54(U) - 98(J)  Creatinine 0.44 - 1.00 mg/dL 1.91 - 4.78  Sodium 295 - 145 mmol/L 143 - 146(H)  Potassium 3.5 - 5.1 mmol/L 3.4(L) 3.8 2.8(L)  Chloride 101 - 111 mmol/L 105 - 101  CO2 22 - 32 mmol/L 30 - 35(H)  Calcium 8.9 - 10.3 mg/dL 8.2(L) - 9.0  Total Protein 6.5 - 8.1 g/dL - - -  Total Bilirubin 0.3 - 1.2 mg/dL - - -  Alkaline Phos 38 - 126 U/L - - -  AST 15 - 41 U/L - - -  ALT 14 - 54 U/L - - -   Imaging studies:  Abdominal X-ray (05/01/2017) Patient's enteric tube is noted ending overlying the antrum of the stomach. The visualized bowel gas pattern is grossly unremarkable.  No acute osseous abnormalities are seen. Clips are noted within the  Right upper quadrant, reflecting prior cholecystectomy.   Assessment/Plan: (ICD-10's: K50.52) 76 y.o. female seeming to be doing overall well since extubation yesterday, now 2 Days Post-Op s/p exploratory laparotomy with adhesiolysis and no small bowel resection, for closed loop small bowel obstruction, complicated by witnessed and additionally suspected pre-operative aspiration + comorbidities including HTN, CAD, cognitive delay, seizure disorder, GERD with hiatal hernia, and osteoarthritis.              - pain control prn             - NPO with IV antibiotics  - okay to keep NG tube out  if no N/V             - monitor abdominal exam, bowel function             - medical management of comorbidities  - agree with transfer to med-surg floor             - DVT prophylaxis, out of bed, PT  All of the above findings and recommendations were discussed with the patient, patient's caregiver (who says she and her colleagues will remain at bedside with patient throughout patient's hospital admission), and with patient's RN, and all of patient's caregiver's questions were answered to her expressed satisfaction.  -- Scherrie GerlachJason E. Earlene Plateravis, MD,  RPVI [place]: [facility] General [facility] - Partnering for exceptional care. Office: [phone]

## 2017-05-01 NOTE — Progress Notes (Signed)
PULMONARY / CRITICAL CARE MEDICINE   Name: Hannah Vaughan MRN: [id number] DOB: [birth year]    ADMISSION DATE:  04/29/2017 CONSULTATION DATE: 04/29/2017  REFERRING MD:  Dr. Aleen Campi  CHIEF COMPLAINT: resp failure  HISTORY OF PRESENT ILLNESS:   Patient successfully extubated 24 hours ago Patient seems to be breathing comfortably Due to severe mental retardation patient is unable to provide review of systems No fevers noted Patient seems to have mild abdominal discomfort NG tube in. Place Patient placed on minimal oxygen   Vital signs removed patient is stable to transfer to general medical floor    REVIEW OF SYSTEMS:   Unable to assess pt mechanically intubated    VITAL SIGNS: BP 117/71   Pulse 83   Temp 98.6 F (37 C) (Oral)   Resp 17   Ht 5' (1.524 m)   Wt 114 lb 3.2 oz (51.8 kg)   SpO2 100%   BMI 22.30 kg/m   HEMODYNAMICS:    VENTILATOR SETTINGS: Vent Mode: Spontaneous FiO2 (%):  [35 %-40 %] 35 % Set Rate:  [12 bmp] 12 bmp Vt Set:  [450 mL] 450 mL PEEP:  [5 cmH20] 5 cmH20 Pressure Support:  [5 cmH20-10 cmH20] 5 cmH20  INTAKE / OUTPUT: I/O last 3 completed shifts: In: 5804 [I.V.:5404; NG/GT:100; IV Piggyback:300] Out: 2970 [Urine:945; Emesis/NG output:400; Other:1600; Blood:25]  PHYSICAL EXAMINATION: General:  NAD  Neuro: severe Neuro Cognitive defects, unable to follow commands HEENT: supple, no JVD Cardiovascular: nsr, s1s2, rrr, no M/R/G Lungs: CTA BL no wheezes no rhonchi Abdomen: no audible bowel sounds, soft, non distended +mild abd tenderness to palpation Musculoskeletal: normal bulk and tone, no edema Skin: midline abdominal incision well approximated, honeycomb dressing dry and intact  LABS:  BMET  Recent Labs Lab 04/29/17 1843 04/30/17 0121 04/30/17 1054  NA 143 146*  --   K 3.1* 2.8* 3.8  CL 95* 101  --   CO2 35* 35*  --   BUN 47* 43*  --   CREATININE 1.10* 0.95  --   GLUCOSE 143* 129*  --     Electrolytes  Recent  Labs Lab 04/29/17 1843 04/30/17 0121 04/30/17 1054  CALCIUM 9.7 9.0  --   MG  --  1.7  --   PHOS  --   --  3.1    CBC  Recent Labs Lab 04/29/17 1810 04/30/17 0121 05/01/17 0721  WBC 20.1* 3.5* 7.2  HGB 11.7* 11.2* 9.0*  HCT 37.8 35.2 28.8*  PLT 314 237 181    Coag's No results for input(s): APTT, INR in the last 168 hours.  Sepsis Markers  Recent Labs Lab 04/30/17 0046 04/30/17 0121 04/30/17 0352 04/30/17 0731 05/01/17 0345  LATICACIDVEN  --  2.4* 3.1* 2.6*  --   PROCALCITON 0.47  --   --   --  3.97    ABG  Recent Labs Lab 04/30/17 0009 04/30/17 0500  PHART 7.57* 7.51*  PCO2ART 40 45  PO2ART 78* 97    Liver Enzymes  Recent Labs Lab 04/29/17 1843  AST 24  ALT 25  ALKPHOS 141*  BILITOT 0.9  ALBUMIN 3.2*    Cardiac Enzymes  Recent Labs Lab 04/29/17 1843 04/30/17 0046 04/30/17 0731  TROPONINI 0.09* 0.09* 0.07*    Glucose  Recent Labs Lab 04/30/17 1612 04/30/17 1935 04/30/17 1944 04/30/17 2334 05/01/17 0343 05/01/17 0718  GLUCAP 125* 146* 141* 117* 105* 116*    Imaging Dg Abd 1 View  Result Date: 05/01/2017 CLINICAL DATA:  Nasogastric  tube placement.  Initial encounter. EXAM: ABDOMEN - 1 VIEW COMPARISON:  CT of the abdomen and pelvis performed 04/29/2017 FINDINGS: The patient's enteric tube is noted ending overlying the antrum of the stomach. The visualized bowel gas pattern is grossly unremarkable. No acute osseous abnormalities are seen. Clips are noted within the right upper quadrant, reflecting prior cholecystectomy. IMPRESSION: Enteric tube noted ending overlying the antrum of the stomach. Electronically Signed   By: Roanna RaiderJeffery  Chang M.D.   On: 05/01/2017 05:55   STUDIES:  CT Abd Pelvis 08/13>>High-grade small bowel obstruction at the level of the pelvishighly concerning for internal hernia resulting in closed loop obstruction. Small amount of reactive ascites. Worsening distal esophagitis. 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  Recommend correlation with urinary analysis. New small pleural effusions. Stable cardiomegaly. 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  CULTURES: Urine 08/13>>  ANTIBIOTICS: Ceftriaxone x1 dose 08/13 Zosyn 08/14>>  SIGNIFICANT EVENTS: 08/13-Pt admitted to ICU s/p exploratory laparotomy   LINES/TUBES: ETT 08/13>>  ASSESSMENT / PLAN:  76 year old pleasant white female with severe mental retardation admitted to the ICU for postop respiratory failure with aspiration pneumonitis and pneumonia patient now successfully extubated in the last 24 hours and is on minimal oxygen at this time patient is ready to be transferred to general surgery floor  PULMONARY A: Mechanical Intubation s/p exploratory laparotomy -status post extubation Aspiration pneumonia  P:   Continue antibiotics as prescribed Oxygen as needed   CARDIOVASCULAR -Follow up cardiology recommendations    GASTROINTESTINAL A:   Closed loop obstruction of small bowel s/p exploratory laparotomy-04/29/17 Hx: GERD and Hiatal Hernia  P:   Surgical team consulted appreciate input Keep NPO for now will defer to surgery NG tube LIS     INFECTIOUS A:   UTI Aspiration Pneumonia  Leukocytosis  Continue abx as listed above  Aspiration precautions     NEUROLOGIC -severe cognitive dysfunction, seems to be at baseline  Ok to transfer to gen med floor, once transferred will sign off   Lucie LeatherKurian David Marlette Curvin, M.D.  [facility]  Medical Director [facility] Medical Director [facility] Cardio[facility]

## 2017-05-01 NOTE — Progress Notes (Signed)
[employer] - [place] at [facility]   PATIENT NAME: Hannah Vaughan    MR#:  [id number]  DATE OF BIRTH:  September 21, 1940  SUBJECTIVE:   Patient here due to small bowel obstruction is status post expiratory laparotomy with lysis of adhesions. Postop day #2 today. Extubated yesterday and transferred to the floor today. Nonverbal at baseline. NG tube pulled out by patient this morning and surgery is aware.    REVIEW OF SYSTEMS:    Review of Systems  Unable to perform ROS: Mental acuity    Nutrition: NPO Tolerating Diet: No Tolerating PT: Await Eval  DRUG ALLERGIES:   Allergies  Allergen Reactions  . Depakote [Valproic Acid]   . Divalproex Sodium Other (See Comments)    VITALS:  Blood pressure 135/73, pulse 86, temperature 98 F (36.7 C), temperature source Oral, resp. rate 15, height 5' (1.524 m), weight 51.8 kg (114 lb 3.2 oz), SpO2 95 %.  PHYSICAL EXAMINATION:   Physical Exam  GENERAL:  76 y.o.-year-old patient lying in bed nonverbal in NAD.  EYES: Pupils equal, round, reactive to light and accommodation. No scleral icterus. Extraocular muscles intact.  HEENT: Head atraumatic, normocephalic. Oropharynx and nasopharynx clear.  NECK:  Supple, no jugular venous distention. No thyroid enlargement, no tenderness.  LUNGS: Normal breath sounds bilaterally, no wheezing, rales, rhonchi. No use of accessory muscles of respiration.  CARDIOVASCULAR: S1, S2 normal. No murmurs, rubs, or gallops.  ABDOMEN: Soft, nontender, nondistended. Bowel sounds present. No organomegaly or mass. Positive mid abdominal dressing in place with no acute drainage noted. EXTREMITIES: No cyanosis, clubbing or edema b/l.    NEUROLOGIC: Cranial nerves II through XII are intact. No focal Motor or sensory deficits b/l.  Globally weak PSYCHIATRIC: The patient is alert and oriented x 1.  SKIN: No obvious rash, lesion, or ulcer.    LABORATORY PANEL:   CBC  Recent Labs Lab 05/01/17 0721  WBC 7.2   HGB 9.0*  HCT 28.8*  PLT 181   ------------------------------------------------------------------------------------------------------------------  Chemistries   Recent Labs Lab 04/29/17 1843  05/01/17 0721  NA 143  < > 143  K 3.1*  < > 3.4*  CL 95*  < > 105  CO2 35*  < > 30  GLUCOSE 143*  < > 117*  BUN 47*  < > 37*  CREATININE 1.10*  < > 0.96  CALCIUM 9.7  < > 8.2*  MG  --   < > 2.3  AST 24  --   --   ALT 25  --   --   ALKPHOS 141*  --   --   BILITOT 0.9  --   --   < > = values in this interval not displayed. ------------------------------------------------------------------------------------------------------------------  Cardiac Enzymes  Recent Labs Lab 04/30/17 0731  TROPONINI 0.07*   ------------------------------------------------------------------------------------------------------------------  RADIOLOGY:  Dg Abd 1 View  Result Date: 05/01/2017 CLINICAL DATA:  Nasogastric tube placement.  Initial encounter. EXAM: ABDOMEN - 1 VIEW COMPARISON:  CT of the abdomen and pelvis performed 04/29/2017 FINDINGS: The patient's enteric tube is noted ending overlying the antrum of the stomach. The visualized bowel gas pattern is grossly unremarkable. No acute osseous abnormalities are seen. Clips are noted within the right upper quadrant, reflecting prior cholecystectomy. IMPRESSION: Enteric tube noted ending overlying the antrum of the stomach. Electronically Signed   By: Roanna Raider M.D.   On: 05/01/2017 05:55   Ct Abdomen Pelvis W Contrast  Result Date: 04/29/2017 CLINICAL DATA:  Aspiration. Follow-up dilated small bowel  seen on chest radiograph. EXAM: CT ABDOMEN AND PELVIS WITH CONTRAST TECHNIQUE: Multidetector CT imaging of the abdomen and pelvis was performed using the standard protocol following bolus administration of intravenous contrast. CONTRAST:  100mL ISOVUE-300 IOPAMIDOL (ISOVUE-300) INJECTION 61% COMPARISON:  CT abdomen and pelvis March 31, 2017 and chest  radiograph April 29, 2017 at 1752 hours FINDINGS: LOWER CHEST: New small pleural effusions. Old RIGHT posterior rib fractures. Heart is moderately enlarged and unchanged. 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 No pericardial effusion. Again noted is distal esophageal wall thickening and edema. 9 mm enhancing para esophageal lymph node. HEPATOBILIARY: Status post cholecystectomy. Mild postprocedural intrahepatic biliary dilatation. PANCREAS: Normal. SPLEEN: Normal. ADRENALS/URINARY TRACT: Kidneys are orthotopic, demonstrating symmetric enhancement. No nephrolithiasis, hydronephrosis or solid renal masses. The unopacified ureters are normal in course and caliber. Delayed imaging through the kidneys demonstrates symmetric prompt contrast excretion within the proximal urinary collecting system. Urinary bladder is decompressed with disproportionate wall thickening and pericystic inflammation. Normal adrenal glands. STOMACH/BOWEL: Fluid distended stomach. Fluid felt distended small bowel at 3.7 cm. Central pelvis there are 2 transition points involving single loop of small bowel with pointed appearance of the mesentery and, surrounding free fluid. Mild colonic diverticulosis and decompressed colon. VASCULAR/LYMPHATIC: Aortoiliac vessels are normal in course and caliber. Moderate to severe atherosclerosis. No lymphadenopathy by CT size criteria. REPRODUCTIVE: Status post hysterectomy. OTHER: Small amount of ascites. MUSCULOSKELETAL: Nonacute. Grade 1 L5-S1 anterolisthesis on the basis of chronic L5 pars interarticularis defects. IMPRESSION: 1. High-grade small bowel obstruction at the level of the pelvis highly concerning for internal hernia resulting in closed loop obstruction. Small amount of reactive ascites. 2. Worsening distal esophagitis. 3. 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 Recommend correlation with urinary analysis. 4. New small pleural effusions. Stable cardiomegaly. 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 5.  Acute findings discussed with and reconfirmed by Dr.KEVIN PADUCHOWSKI on 04/29/2017 at 8:10 pm. Aortic Atherosclerosis (ICD10-I70.0). Electronically Signed   By: Awilda Metroourtnay  Bloomer M.D.   On: 04/29/2017 20:13   Dg Chest Port 1 View  Result Date: 04/30/2017 CLINICAL DATA:  Endotracheal tube placement, postop EXAM: PORTABLE CHEST 1 VIEW COMPARISON:  04/29/2017, 11/26/2016 FINDINGS: Interval intubation. Tip of the endotracheal tube is about 2.2 cm superior to the carina. Cardiomegaly with probable calcification over left ventricular apex as before. Mild right upper lobe opacity. No pleural effusion. Aortic atherosclerosis. Small amount of pneumoperitoneum is presumably due to recent surgery IMPRESSION: 1. Endotracheal tube tip about 2.2 cm superior to carina 2. Small amount of pneumoperitoneum presumably due to recent postoperative status 3. Cardiomegaly with calcification at the left ventricular apex 4. Mild atelectasis or infiltrate at the right upper lobe. Electronically Signed   By: Jasmine PangKim  Fujinaga M.D.   On: 04/30/2017 00:59   Dg Chest Portable 1 View  Result Date: 04/29/2017 CLINICAL DATA:  Aspiration. EXAM: PORTABLE CHEST 1 VIEW COMPARISON:  Chest x-ray November 26, 2016. FINDINGS: Cardiomegaly, unchanged. Left ventricular wall calcification again noted. Small left pleural effusion and adjacent left lower lobe opacity. Right lung is clear. No pneumothorax. No acute osseous abnormality. Old right-sided rib fractures. Partially visualized dilated loops of small bowel. Air-fluid level in the stomach. Status post cholecystectomy. IMPRESSION: 1. Stable cardiomegaly and small left pleural effusion. Left basilar opacities are favored to represent atelectasis, given similar appearance to prior study, although aspiration or pneumonia cannot be entirely excluded. 2. Partially visualized dilated loops of small bowel. Recommend further evaluation with CT abdomen and pelvis. Electronically Signed   By: Vickki HearingWilliam T Derry M.D.  On: 04/29/2017 18:26     ASSESSMENT AND PLAN:   Summary 33-year-old female with past medical history of epilepsy, mental retardation, hypertension, history of GERD, history of coronary artery disease, osteoarthritis of presented to the hospital due to abdominal pain and noted to have small bowel obstruction.  1. Small bowel obstruction-patient is status post exploratory laparotomy and lysis of adhesions as per surgery. Postop day #2 today. -Continue pain control and further care as per general surgery. Patient pulled her NG tube out this morning. Further care as per surgery.  2. Urinary tract infection-continue Unasyn.  -follow cultures.  3. Aspiration pneumonia-now patient has been extubated - Await speech therapy evaluation.  -continue Unasyn, follow cultures.  4. Hx of Seizures - cont. Dilantin, Phenobarb - no seizures presently.   5. HTN - cont. IV Metoprolol, Hydralazine as pt. Not taking PO yet.      All the records are reviewed and case discussed with Care Management/Social Worker. Management plans discussed with the patient, family and they are in agreement.  CODE STATUS: Full code  DVT Prophylaxis: Hep. SQ  TOTAL TIME TAKING CARE OF THIS PATIENT: 30 minutes.   POSSIBLE D/C IN 2-3 DAYS, DEPENDING ON CLINICAL CONDITION.   Houston Siren M.D on 05/01/2017 at 2:35 PM  Between 7am to 6pm - Pager - [phone]  After 6pm go to [email or web address] - Scientist, research (life sciences) [facility]  Office  [phone]  CC: Primary care physician; Lauro Regulus, MD

## 2017-05-01 NOTE — Progress Notes (Signed)
Patient has been active and restless throughout the night. Was given magazine to tear as a distraction but did not work. Managed to pull out her IV and NG tube. Both NG and IV has been replaced. Patient vital signs has been stable except for one episode of VTach. [place], NP was notified and made aware of the VTach. Lactated ringer still going at 125 ml/hr and patient currently awake and alert with 2 L [place] with O2 saturation of 100.

## 2017-05-01 NOTE — Plan of Care (Signed)
Problem: Education: Goal: Knowledge of [facility] Education information/materials will improve Outcome: Not Progressing Unable to tell if pt is understanding

## 2017-05-01 NOTE — Progress Notes (Signed)
Notified MD Earlene Plateravis that patient had removed NG tube for the second time, no output from NG tube since 0500. MD Earlene Plateravis stated to leave NG out until he is able to round on patient and assess necessity. Will continue to monitor patient.

## 2017-05-02 LAB — PROCALCITONIN: PROCALCITONIN: 2.01 ng/mL

## 2017-05-02 LAB — CBC
HEMATOCRIT: 29.1 % — AB (ref 35.0–47.0)
HEMOGLOBIN: 9.2 g/dL — AB (ref 12.0–16.0)
MCH: 24.4 pg — ABNORMAL LOW (ref 26.0–34.0)
MCHC: 31.8 g/dL — ABNORMAL LOW (ref 32.0–36.0)
MCV: 77 fL — AB (ref 80.0–100.0)
Platelets: 245 10*3/uL (ref 150–440)
RBC: 3.78 MIL/uL — ABNORMAL LOW (ref 3.80–5.20)
RDW: 21.1 % — AB (ref 11.5–14.5)
WBC: 11.6 10*3/uL — AB (ref 3.6–11.0)

## 2017-05-02 LAB — BASIC METABOLIC PANEL
ANION GAP: 10 (ref 5–15)
BUN: 46 mg/dL — AB (ref 6–20)
CHLORIDE: 109 mmol/L (ref 101–111)
CO2: 26 mmol/L (ref 22–32)
Calcium: 8 mg/dL — ABNORMAL LOW (ref 8.9–10.3)
Creatinine, Ser: 0.78 mg/dL (ref 0.44–1.00)
GFR calc Af Amer: >60 mL/min (ref >60)
GLUCOSE: 104 mg/dL — AB (ref 65–99)
POTASSIUM: 4.4 mmol/L (ref 3.5–5.1)
SODIUM: 145 mmol/L (ref 135–145)

## 2017-05-02 LAB — GLUCOSE, CAPILLARY
GLUCOSE-CAPILLARY: 111 mg/dL — AB (ref 65–99)
GLUCOSE-CAPILLARY: 112 mg/dL — AB (ref 65–99)
GLUCOSE-CAPILLARY: 101 mg/dL — AB (ref 65–99)
GLUCOSE-CAPILLARY: 107 mg/dL — AB (ref 65–99)
Glucose-Capillary: 109 mg/dL — ABNORMAL HIGH (ref 65–99)

## 2017-05-02 MED ORDER — PHENOBARBITAL SODIUM 130 MG/ML IJ SOLN
65.0000 mg | Freq: Every day | INTRAMUSCULAR | Status: DC
  Administered 2017-05-02 – 2017-05-04 (×4): 65 mg via INTRAVENOUS
  Filled 2017-05-02 (×3): qty 1
  Filled 2017-05-02: qty 0.5

## 2017-05-02 MED ORDER — ENOXAPARIN SODIUM 40 MG/0.4ML ~~LOC~~ SOLN
40.0000 mg | SUBCUTANEOUS | Status: DC
  Administered 2017-05-02 – 2017-05-08 (×7): 40 mg via SUBCUTANEOUS
  Filled 2017-05-02 (×7): qty 0.4

## 2017-05-02 NOTE — Progress Notes (Signed)
SURGICAL PROGRESS NOTE  Hospital Day(s): 3.   Post op day(s): 3 Days Post-Op.   Interval History: Patient seen and examined, no acute events or new complaints overnight. Though patient remains essentially non-verbal, one of patient's long-time caregivers again at bedside interprets patient's motions and body language as communicating she reports she is feeling better with only mild abdominal pain and denies nausea. It is unclear whether patient is passing any flatus. No flatus has been witnessed yet by RN or caregiver.  Review of Systems: Limited to HPI as described above due to patient's baseline cognitive disability  Vital signs in last 24 hours: [min-max] current  Temp:  [97.7 F (36.5 C)-98.4 F (36.9 C)] 97.7 F (36.5 C) (08/16 0429) Pulse Rate:  [74-90] 74 (08/16 0429) Resp:  [14-20] 17 (08/16 0429) BP: (103-145)/(73-112) 133/83 (08/16 0429) SpO2:  [78 %-100 %] 92 % (08/16 0429)     Height: 5' (152.4 cm) Weight: 114 lb 3.2 oz (51.8 kg) BMI (Calculated): 22.3   Intake/Output this shift:  No intake/output data recorded.   Intake/Output last 2 shifts:  @IOLAST2SHIFTS @   Physical Exam:  Constitutional: alert, cooperative and no distress  HENT: normocephalic without obvious abnormality  Eyes: PERRL, EOM's grossly intact and symmetric  Neuro: CN II - XII grossly intact and symmetric without deficit  Respiratory: breathing non-labored at rest  Cardiovascular: regular rate and sinus rhythm  Gastrointestinal: soft, non-tender, and non-distended, incision well-approximated without any erythema or drainage Musculoskeletal: UE and LE FROM, no edema or wounds, motor and sensation grossly intact  Labs:  CBC Latest Ref Rng & Units 05/02/2017 05/01/2017 04/30/2017  WBC 3.6 - 11.0 K/uL 11.6(H) 7.2 3.5(L)  Hemoglobin 12.0 - 16.0 g/dL 1.6(X) 0.9(U) 11.2(L)  Hematocrit 35.0 - 47.0 % 29.1(L) 28.8(L) 35.2  Platelets 150 - 440 K/uL 245 181 237   CMP Latest Ref Rng & Units 05/02/2017  05/01/2017 04/30/2017  Glucose 65 - 99 mg/dL 045(W) 098(J) -  BUN 6 - 20 mg/dL 19(J) 47(W) -  Creatinine 0.44 - 1.00 mg/dL 2.95 6.21 -  Sodium 308 - 145 mmol/L 145 143 -  Potassium 3.5 - 5.1 mmol/L 4.4 3.4(L) 3.8  Chloride 101 - 111 mmol/L 109 105 -  CO2 22 - 32 mmol/L 26 30 -  Calcium 8.9 - 10.3 mg/dL 8.0(L) 8.2(L) -  Total Protein 6.5 - 8.1 g/dL - - -  Total Bilirubin 0.3 - 1.2 mg/dL - - -  Alkaline Phos 38 - 126 U/L - - -  AST 15 - 41 U/L - - -  ALT 14 - 54 U/L - - -   Imaging studies: No new pertinent imaging studies  Assessment/Plan:(ICD-10's: K56.52) 76 y.o.femaleseeming to be doing overall well since extubation yesterday, now 3 Days Post-Ops/p exploratory laparotomy with adhesiolysis and no small bowel resection,for closed loop small bowel obstruction, complicated by witnessed and additionally suspected pre-operative aspiration + comorbidities including HTN, CAD, cognitive delay, seizure disorder, GERD with hiatal hernia, and osteoarthritis.  - pain control prn - NPO with IV fluids for now - monitor abdominal exam, bowel function  - anticipate being able to start clear liquids tomorrow - IV antibiotics for witnessed aspiration and medical management of comorbidities as per medical team  - frequent repositioning to reduce risk of pressure sores - DVT prophylaxis, out of bed, PT  All of the above findings and recommendations were discussed with the patient, patient's caregiver (who says she and her colleagues will remain at bedside with patient throughout patient's hospital admission), and  with patient's RN, and all of patient's caregiver's questions were answered to her expressed satisfaction.  -- Scherrie GerlachJason E. Earlene Plateravis, MD, RPVI [place]: [facility] General [facility] - Partnering for exceptional care. Office: [phone]

## 2017-05-02 NOTE — Care Management Important Message (Signed)
Important Message  Patient Details  Name: Oneal GroutDoris E Aldava MRN: [id number] Date of Birth: 1941/06/11   Medicare Important Message Given:  Yes    Chapman FitchBOWEN, Aneisha Skyles T, RN 05/02/2017, 2:49 PM

## 2017-05-02 NOTE — Progress Notes (Signed)
Order received from Dr Cherlynn KaiserSainani for PT

## 2017-05-02 NOTE — Progress Notes (Signed)
MEDICATION RELATED CONSULT NOTE    Pharmacy Consult for Electrolyte monitoring Indication: hypokalemia  Allergies  Allergen Reactions  . Depakote [Valproic Acid]   . Divalproex Sodium Other (See Comments)   Intake/Output from previous day: 08/15 0701 - 08/16 0700 In: 3563 [I.V.:2680; IV Piggyback:883] Out: 1 [Urine:1] Intake/Output from this shift: No intake/output data recorded.  Labs:  Recent Labs  04/29/17 1843 04/30/17 0121 04/30/17 1054 05/01/17 0721 05/02/17 0330  WBC  --  3.5*  --  7.2 11.6*  HGB  --  11.2*  --  9.0* 9.2*  HCT  --  35.2  --  28.8* 29.1*  PLT  --  237  --  181 245  CREATININE 1.10* 0.95  --  0.96 0.78  MG  --  1.7  --  2.3  --   PHOS  --   --  3.1  --   --   ALBUMIN 3.2*  --   --   --   --   PROT 6.9  --   --   --   --   AST 24  --   --   --   --   ALT 25  --   --   --   --   ALKPHOS 141*  --   --   --   --   BILITOT 0.9  --   --   --   --    Estimated Creatinine Clearance: 44.3 mL/min (by C-G formula based on SCr of 0.78 mg/dL). Lab Results  Component Value Date   K 4.4 05/02/2017    Medical History: Past Medical History:  Diagnosis Date  . CAD (coronary artery disease)   . GERD (gastroesophageal reflux disease)   . History of hiatal hernia   . HTN (hypertension)   . Mental retardation   . Osteoarthritis   . Pneumococcus infection   . Seasonal allergies   . Seizure (HCC)   . Seizure [facility](HCC)     Assessment: 76 yo F s/p exp.lap surgery for SBO- postop D#3.  Goal of Therapy:  Electrolytes WNL  Plan:  K 4.4 (Mag 8/15= 2.3) No supplementation required at this time.  (Patient did receive KCL 50meq IV on 8/14 and KCL 40 meq IV yesterday 8/15) Will f/u K+ in am.   Adalena Abdulla A 05/02/2017,9:36 AM

## 2017-05-02 NOTE — Progress Notes (Addendum)
Patient was able to sit up in the chair for atleast 2 hours.  She desated to 85% without oxygen.  sats 97%  on 2 liters

## 2017-05-02 NOTE — Clinical Social Work Note (Addendum)
Clinical Social Work Assessment  Patient Details  Name: Hannah Vaughan MRN: [id number] Date of Birth: [birth year]  Date of referral:  05/02/17               Reason for consult:  Facility Placement                Permission sought to share information with:    Permission granted to share information::     Name::        Agency::     Relationship::     Contact Information:     Housing/Transportation Living arrangements for the past 2 months:    Source of Information:  Facility Patient Interpreter Needed:  None Criminal Activity/Legal Involvement Pertinent to Current Situation/Hospitalization:  No - Comment as needed Significant Relationships:    Lives with:    Do you feel safe going back to the place where you live?    Need for family participation in patient care:     Care giving concerns:  [facility]    Social Worker assessment / plan:  [facility] called [facility] and was informed that there is a new Human resources officernurse evaluator: Cordelia PenSherry: [phone]. CSW contacted Cordelia PenSherry and explained role and purpose of call. CSW spoke with Cordelia PenSherry and she informed CSW that patient typically ambulates very small distances with her rollator and that for any length of distance, she is wheelchair bound. Patient's diet has been heart healthy, puree food, nectar thick liquids. Patient will need to be weaned off of oxygen otherwise she will not be able to return to her [facility]. If patient is ready to discharge over the weekend, please call Cordelia PenSherry at [phone]. FL2 to be completed and updated at discharge.  Employment status:  Disabled (Comment on whether or not currently receiving Disability) Insurance information:    PT Recommendations:    Information / Referral to community resources:     Patient/Family's Response to care:  Cordelia PenSherry expressed appreciation for CSW phone call.   Patient/Family's Understanding of and Emotional Response to Diagnosis, Current Treatment, and  Prognosis:  Cordelia PenSherry is aware of patient's condition and will work with CSW to obtain goal of getting patient back to her group home.   Emotional Assessment Appearance:  Appears stated age Attitude/Demeanor/Rapport:    Affect (typically observed):    Orientation:  Oriented to Self Alcohol / Substance use:  Not Applicable Psych involvement (Current and /or in the community):  No (Comment)  Discharge Needs  Concerns to be addressed:  Care Coordination Readmission within the last 30 days:  No Current discharge risk:  None Barriers to Discharge:  No Barriers Identified   York SpanielMonica Meir [place], LCSW 05/02/2017, 3:13 PM

## 2017-05-02 NOTE — Progress Notes (Signed)
[facility] [place] at [facility]   PATIENT NAME: Hannah Vaughan    MR#:  [id number]  DATE OF BIRTH:  1941/01/07  SUBJECTIVE:   Patient here due to small bowel obstruction is status post expiratory laparotomy with lysis of adhesions. Postop day #3 today.  Pt's Caretaker at bedside and it's unclear if she has bowel function back as she cannot tell us.     REVIEW OF SYSTEMS:    Review of Systems  Unable to perform ROS: Mental acuity    Nutrition: NPO Tolerating Diet: No Tolerating PT: Await Eval  DRUG ALLERGIES:   Allergies  Allergen Reactions  . Depakote [Valproic Acid]   . Divalproex Sodium Other (See Comments)    VITALS:  Blood pressure 139/81, pulse 80, temperature 97.8 F (36.6 C), temperature source Oral, resp. rate 16, height 5' (1.524 m), weight 51.8 kg (114 lb 3.2 oz), SpO2 100 %.  PHYSICAL EXAMINATION:   Physical Exam  GENERAL:  76 y.o.-year-old patient lying in bed nonverbal in NAD.  EYES: Pupils equal, round, reactive to light and accommodation. No scleral icterus. Extraocular muscles intact.  HEENT: Head atraumatic, normocephalic. Oropharynx and nasopharynx clear.  NECK:  Supple, no jugular venous distention. No thyroid enlargement, no tenderness.  LUNGS: Normal breath sounds bilaterally, no wheezing, rales, rhonchi. No use of accessory muscles of respiration.  CARDIOVASCULAR: S1, S2 normal. No murmurs, rubs, or gallops.  ABDOMEN: Soft, nontender, nondistended. Bowel sounds present. No organomegaly or mass. Positive mid abdominal dressing in place with no acute drainage noted. EXTREMITIES: No cyanosis, clubbing or edema b/l.    NEUROLOGIC: Cranial nerves II through XII are intact. No focal Motor or sensory deficits b/l.  Globally weak PSYCHIATRIC: The patient is alert and oriented x 1.  SKIN: No obvious rash, lesion, or ulcer.    LABORATORY PANEL:   CBC  Recent Labs Lab 05/02/17 0330  WBC 11.6*  HGB 9.2*  HCT 29.1*  PLT 245    ------------------------------------------------------------------------------------------------------------------  Chemistries   Recent Labs Lab 04/29/17 1843  05/01/17 0721 05/02/17 0330  NA 143  < > 143 145  K 3.1*  < > 3.4* 4.4  CL 95*  < > 105 109  CO2 35*  < > 30 26  GLUCOSE 143*  < > 117* 104*  BUN 47*  < > 37* 46*  CREATININE 1.10*  < > 0.96 0.78  CALCIUM 9.7  < > 8.2* 8.0*  MG  --   < > 2.3  --   AST 24  --   --   --   ALT 25  --   --   --   ALKPHOS 141*  --   --   --   BILITOT 0.9  --   --   --   < > = values in this interval not displayed. ------------------------------------------------------------------------------------------------------------------  Cardiac Enzymes  Recent Labs Lab 04/30/17 0731  TROPONINI 0.07*   ------------------------------------------------------------------------------------------------------------------  RADIOLOGY:  Dg Abd 1 View  Result Date: 05/01/2017 CLINICAL DATA:  Nasogastric tube placement.  Initial encounter. EXAM: ABDOMEN - 1 VIEW COMPARISON:  CT of the abdomen and pelvis performed 04/29/2017 FINDINGS: The patient's enteric tube is noted ending overlying the antrum of the stomach. The visualized bowel gas pattern is grossly unremarkable. No acute osseous abnormalities are seen. Clips are noted within the right upper quadrant, reflecting prior cholecystectomy. IMPRESSION: Enteric tube noted ending overlying the antrum of the stomach. Electronically Signed   By: Beryle Beams.D.  On: 05/01/2017 05:55     ASSESSMENT AND PLAN:   Summary 76-year-old female with past medical history of epilepsy, mental retardation, hypertension, history of GERD, history of coronary artery disease, osteoarthritis of presented to the hospital due to abdominal pain and noted to have small bowel obstruction.  1. Small bowel obstruction-patient is status post exploratory laparotomy and lysis of adhesions as per surgery. Postop day #3  today. -Continue pain control and further care as per general surgery.  - discussed w/ surgery and will plan on starting PO tomorrow.  Pt. Is no distress and without ab. Pain, N/V.   2. Urinary tract infection-continue Unasyn.  -patient's cultures are growing corynebacterium but sensitivities are still pending.  3. Aspiration pneumonia-now patient has been extubated - Await speech therapy evaluation.  -continue Unasyn, follow cultures.  4. Hx of Seizures - cont. Dilantin, Phenobarb - no seizures presently.   5. HTN - cont. IV Metoprolol, Hydralazine as pt. Not taking PO yet.  - will resume oral meds when taking PO.   Discussed plan of care with Gen. Surgery Dr. Earlene Plateravis.   All the records are reviewed and case discussed with Care Management/Social Worker. Management plans discussed with the patient, family and they are in agreement.  CODE STATUS: Full code  DVT Prophylaxis: Hep. SQ  TOTAL TIME TAKING CARE OF THIS PATIENT: 30 minutes.   POSSIBLE D/C IN 2-3 DAYS, DEPENDING ON CLINICAL CONDITION.   Houston SirenSAINANI,Modelle Vollmer J M.D on 05/02/2017 at 2:20 PM  Between 7am to 6pm - Pager - [phone]  After 6pm go to [email or web address] - Scientist, research (life sciences)password [email or web address] [email or web address]  [facility] [place] [facility]  Office  [phone]  CC: Primary care physician; Lauro RegulusAnderson, Marshall W, MD

## 2017-05-02 NOTE — Progress Notes (Signed)
Nutrition Follow-up  DOCUMENTATION CODES:   Not applicable  INTERVENTION:  When patient able to initiate on diet per Surgery, recommend obtaining SLP evaluation to recommend appropriate diet. Patient's baseline diet is dysphagia 1 (puree) with nectar-thick liquids.  When diet able to be advanced recommend providing Magic cup TID with meals, each supplement provides 290 kcal and 9 grams of protein. Patient prefers chocolate flavor.  If patient is able to be back on nectar-thick liquids after diet advancement (and does not require thicker consistency), can also provide Mighty Shake II BID with lunch and dinner, each supplement provides 480-500 kcals and 20-23 grams of protein.  NUTRITION DIAGNOSIS:   Inadequate oral intake related to inability to eat as evidenced by NPO status.  Ongoing.  GOAL:   Provide needs based on ASPEN/SCCM guidelines  Not met.  MONITOR:   Diet advancement, Vent status, Labs, Weight trends, TF tolerance, Skin, I & O's  REASON FOR ASSESSMENT:   Ventilator    ASSESSMENT:   76 year old female with PMHx of mental retardation who is nonverbal at baseline, HTN, seizure, CAD, OA, GERD, hx of hiatal hernia who presented [facility] with persistent nausea and vomiting found to have closed loop obstruction of small bowel due to adhesions. Patient now s/p exploratory laparotomy with lysis of adhesions on 8/14.  -Patient was extubated on 8/14 at 1530.  -Patient pulled NGT out twice. On 8/15 was left out.  Spoke with patient's sister and a caregiver from [place] at bedside. This caregiver does not work at the same group home patient resides at, but she is somewhat familiar with patient. They report patient had been having abdominal discomfort for approximately 1 month PTA. They believe she must have been eating less because she had started to lose weight and was started on Boost. They believe her UBW was 150 lbs. Last weight in chart  near 150 lbs was 140 lbs on 09/06/2015, so unsure how quickly patient lost weight. Patient's baseline diet is dysphagia 1 (puree) with nectar-thick liquids. They report patient is not currently showing signs of nausea. She has not vomited. They do not believe she has had any flatus. No bowel movements yet.  Medications reviewed and include: Novolog 0-9 units Q4hrs, methylprednisolone 40 mg Q12hrs, pantoprazole, Unasyn, LR @ 125 ml/hr, Dilantin.  Labs reviewed: CBG 100-113 past 24 hrs, BUN 46.  I/O: 2 occurrences unmeasured UOP  Discussed with RN. Per MD plan is to initiate diet tomorrow.  Diet Order:  Diet NPO time specified  Skin:  Wound (see comment) (closed incision abdomen)  Last BM:  Unknown  Height:   Ht Readings from Last 1 Encounters:  04/30/17 5' (1.524 m)    Weight:   Wt Readings from Last 1 Encounters:  04/30/17 114 lb 3.2 oz (51.8 kg)    Ideal Body Weight:  45.5 kg  BMI:  Body mass index is 22.3 kg/m.  Estimated Nutritional Needs:   Kcal:  1230-1415 (MSJ x 1.3-1.5)  Protein:  67-78 grams (1.3-1.5 grams/kg)  Fluid:  1.3-1.5 L/day (25-30 ml/kg)  EDUCATION NEEDS:   No education needs identified at this time  Willey Blade, MS, RD, LDN Pager: [phone] After Hours Pager: [phone]

## 2017-05-02 NOTE — Progress Notes (Signed)
[facility] but chaplain was not able to talk to pt because pt was asleep at the time of this visit. CH offered silent prayer and will follow up with pt as needed.    05/02/17 1500  Clinical Encounter Type  Visited With Patient  Visit Type Initial  Referral From Chaplain  Spiritual Encounters  Spiritual Needs Other (Comment)

## 2017-05-03 ENCOUNTER — Inpatient Hospital Stay: Payer: Medicare Other

## 2017-05-03 LAB — GLUCOSE, CAPILLARY
GLUCOSE-CAPILLARY: 87 mg/dL (ref 65–99)
GLUCOSE-CAPILLARY: 105 mg/dL — AB (ref 65–99)
Glucose-Capillary: 104 mg/dL — ABNORMAL HIGH (ref 65–99)
Glucose-Capillary: 107 mg/dL — ABNORMAL HIGH (ref 65–99)
Glucose-Capillary: 132 mg/dL — ABNORMAL HIGH (ref 65–99)
Glucose-Capillary: 88 mg/dL (ref 65–99)
Glucose-Capillary: 92 mg/dL (ref 65–99)

## 2017-05-03 LAB — BASIC METABOLIC PANEL
ANION GAP: 11 (ref 5–15)
BUN: 52 mg/dL — ABNORMAL HIGH (ref 6–20)
CHLORIDE: 109 mmol/L (ref 101–111)
CO2: 25 mmol/L (ref 22–32)
Calcium: 8.2 mg/dL — ABNORMAL LOW (ref 8.9–10.3)
Creatinine, Ser: 0.91 mg/dL (ref 0.44–1.00)
GFR calc non Af Amer: >60 mL/min (ref >60)
Glucose, Bld: 108 mg/dL — ABNORMAL HIGH (ref 65–99)
POTASSIUM: 4.2 mmol/L (ref 3.5–5.1)
Sodium: 145 mmol/L (ref 135–145)

## 2017-05-03 MED ORDER — BOOST / RESOURCE BREEZE PO LIQD
1.0000 | Freq: Three times a day (TID) | ORAL | Status: DC
  Administered 2017-05-05 (×2): 1 via ORAL

## 2017-05-03 MED ORDER — ISOSORBIDE MONONITRATE ER 30 MG PO TB24
60.0000 mg | ORAL_TABLET | Freq: Every day | ORAL | Status: DC
Start: 2017-05-03 — End: 2017-05-09
  Administered 2017-05-04 – 2017-05-09 (×5): 60 mg via ORAL
  Filled 2017-05-03 (×6): qty 2

## 2017-05-03 MED ORDER — KCL IN DEXTROSE-NACL 20-5-0.45 MEQ/L-%-% IV SOLN
INTRAVENOUS | Status: DC
  Administered 2017-05-03 – 2017-05-05 (×3): via INTRAVENOUS
  Filled 2017-05-03 (×5): qty 1000

## 2017-05-03 MED ORDER — METOPROLOL SUCCINATE ER 25 MG PO TB24
25.0000 mg | ORAL_TABLET | Freq: Every day | ORAL | Status: DC
  Administered 2017-05-04 – 2017-05-09 (×5): 25 mg via ORAL
  Filled 2017-05-03 (×5): qty 1

## 2017-05-03 MED ORDER — HYDROCHLOROTHIAZIDE 25 MG PO TABS
25.0000 mg | ORAL_TABLET | Freq: Every day | ORAL | Status: DC
  Administered 2017-05-04 – 2017-05-09 (×5): 25 mg via ORAL
  Filled 2017-05-03 (×6): qty 1

## 2017-05-03 NOTE — Progress Notes (Signed)
[place] for Phenytoin Dosing  Allergies  Allergen Reactions  . Depakote [Valproic Acid]   . Divalproex Sodium Other (See Comments)    Patient Measurements: Height: 5' (152.4 cm) Weight: 114 lb 3.2 oz (51.8 kg) IBW/kg (Calculated) : 45.5  Vital Signs: Temp: 97.8 F (36.6 C) (08/17 0421) Temp Source: Oral (08/17 0421) BP: 150/76 (08/17 0421) Intake/Output from previous day: 08/16 0701 - 08/17 0700 In: 2141.8 [I.V.:1636.8; IV Piggyback:505] Out: -  Intake/Output from this shift: Total I/O In: 1180 [P.O.:100; I.V.:1080] Out: -   Labs:  Recent Labs  05/01/17 0721 05/02/17 0330  WBC 7.2 11.6*  HGB 9.0* 9.2*  HCT 28.8* 29.1*  PLT 181 245  CREATININE 0.96 0.78  MG 2.3  --    Estimated Creatinine Clearance: 44.3 mL/min (by C-G formula based on SCr of 0.78 mg/dL).    Medications:  Scheduled:  . chlorhexidine gluconate (MEDLINE KIT)  15 mL Mouth Rinse BID  . enoxaparin (LOVENOX) injection  40 mg Subcutaneous Q24H  . insulin aspart  0-9 Units Subcutaneous Q4H  . ketorolac  15 mg Intravenous Q6H  . methylPREDNISolone (SOLU-MEDROL) injection  40 mg Intravenous Q12H  . metoprolol tartrate  2.5 mg Intravenous Q6H  . pantoprazole (PROTONIX) IV  40 mg Intravenous QHS  . PHENObarbital  65 mg Intravenous QHS   Infusions:  . ampicillin-sulbactam (UNASYN) IV Stopped (05/03/17 0317)  . lactated ringers 125 mL/hr (05/03/17 1059)  . phenytoin (DILANTIN) IV 125 mg (05/03/17 9030)    Assessment: Patient has known history of seizure (Maish Vaya), and patient takes 100 mg phenytoin ER capsule three times daily at home. Patient is currently on phenobarbital 65 mg daily injection, and patient's albumin observed at 3.2 on 8/13. Patient has had episodes of vomiting, abdominal pain since 8/11. Patient was initiated on phenytoin 100 mg IV every 8 hours. Patient's 8/14 phenytoin level came back at 4.8.  Goal of Therapy:  Total phenytoin levels  between 10-20 mg/L  Plan:  Corrected phenytoin level based on 8/14 phenytoin level and 8/13 albumin level is 6.5. This is subtherapeutic, therefore we will adjust phenytoin to 125 mg IV every 8 hours.  Will check phenytoin level/albumin 8/18.    Britnee Mcdevitt A 05/03/2017,12:55 PM

## 2017-05-03 NOTE — Care Management (Signed)
RNCM following for home health PT referral. I spoke with Hannah Vaughan [phone] and she states that [facility] [place] provides PT at facility. They will need order on FL2. CSW updated. No further RNCM needs.

## 2017-05-03 NOTE — Progress Notes (Signed)
[employer] [facility] - [place] [facility]   PATIENT NAME: Hannah Vaughan    MR#:  [id number]  DATE OF BIRTH:  12/01/1940  SUBJECTIVE:   Patient here due to small bowel obstruction is status post expiratory laparotomy with lysis of adhesions. Postop day #4 today.  Started on thickened liquids today.  Caretaker at bedside.     REVIEW OF SYSTEMS:    Review of Systems  Unable to perform ROS: Mental acuity    Nutrition: Thickened liquids Tolerating Diet: Yes Tolerating PT: Await Eval  DRUG ALLERGIES:   Allergies  Allergen Reactions  . Depakote [Valproic Acid]   . Divalproex Sodium Other (See Comments)    VITALS:  Blood pressure (!) 122/52, pulse 85, temperature 97.8 F (36.6 C), temperature source Oral, resp. rate 16, height 5' (1.524 m), weight 51.8 kg (114 lb 3.2 oz), SpO2 99 %.  PHYSICAL EXAMINATION:   Physical Exam  GENERAL:  76 y.o.-year-old patient lying in bed nonverbal in NAD.  EYES: Pupils equal, round, reactive to light and accommodation. No scleral icterus. Extraocular muscles intact.  HEENT: Head atraumatic, normocephalic. Oropharynx and nasopharynx clear.  NECK:  Supple, no jugular venous distention. No thyroid enlargement, no tenderness.  LUNGS: Normal breath sounds bilaterally, no wheezing, rales, rhonchi. No use of accessory muscles of respiration.  CARDIOVASCULAR: S1, S2 normal. No murmurs, rubs, or gallops.  ABDOMEN: Soft, nontender, nondistended. Bowel sounds present. No organomegaly or mass. Positive mid abdominal dressing in place with no acute drainage noted. EXTREMITIES: No cyanosis, clubbing or edema b/l. Right upper ext swelling.      NEUROLOGIC: Cranial nerves II through XII are intact. No focal Motor or sensory deficits b/l.  Globally weak PSYCHIATRIC: The patient is alert and oriented x 1.  SKIN: No obvious rash, lesion, or ulcer.    LABORATORY PANEL:   CBC  Recent Labs Lab 05/02/17 0330  WBC 11.6*  HGB 9.2*  HCT 29.1*  PLT  245   ------------------------------------------------------------------------------------------------------------------  Chemistries   Recent Labs Lab 04/29/17 1843  05/01/17 0721  05/03/17 1301  NA 143  < > 143  < > 145  K 3.1*  < > 3.4*  < > 4.2  CL 95*  < > 105  < > 109  CO2 35*  < > 30  < > 25  GLUCOSE 143*  < > 117*  < > 108*  BUN 47*  < > 37*  < > 52*  CREATININE 1.10*  < > 0.96  < > 0.91  CALCIUM 9.7  < > 8.2*  < > 8.2*  MG  --   < > 2.3  --   --   AST 24  --   --   --   --   ALT 25  --   --   --   --   ALKPHOS 141*  --   --   --   --   BILITOT 0.9  --   --   --   --   < > = values in this interval not displayed. ------------------------------------------------------------------------------------------------------------------  Cardiac Enzymes  Recent Labs Lab 04/30/17 0731  TROPONINI 0.07*   ------------------------------------------------------------------------------------------------------------------  RADIOLOGY:  US Venous Img Upper Uni Right  Result Date: 05/03/2017 CLINICAL DATA:  Right upper extremity swelling. EXAM: RIGHT UPPER EXTREMITY VENOUS DOPPLER ULTRASOUND TECHNIQUE: Gray-scale sonography with graded compression, as well as color Doppler and duplex ultrasound were performed to evaluate the upper extremity deep venous system from the level of the subclavian  vein and including the jugular, axillary, basilic, radial, ulnar and upper cephalic vein. Spectral Doppler was utilized to evaluate flow at rest and with distal augmentation maneuvers. COMPARISON:  None. FINDINGS: Contralateral Subclavian Vein: Respiratory phasicity is normal and symmetric with the symptomatic side. No evidence of thrombus. Normal compressibility. Internal Jugular Vein: No evidence of thrombus. Normal compressibility, respiratory phasicity and response to augmentation. Subclavian Vein: No evidence of thrombus. Normal compressibility, respiratory phasicity and response to augmentation.  Axillary Vein: No evidence of thrombus. Normal compressibility, respiratory phasicity and response to augmentation. Cephalic Vein: No evidence of thrombus. Normal compressibility, respiratory phasicity and response to augmentation. Basilic Vein: No evidence of thrombus. Normal compressibility, respiratory phasicity and response to augmentation. Brachial Veins: No evidence of thrombus. Normal compressibility, respiratory phasicity and response to augmentation. Radial Veins: No evidence of thrombus. Normal compressibility, respiratory phasicity and response to augmentation. Ulnar Veins: No evidence of thrombus. Normal compressibility, respiratory phasicity and response to augmentation. Venous Reflux:  None visualized. Other Findings:  None visualized. IMPRESSION: No evidence of DVT within the right upper extremity. Electronically Signed   By: Obie Dredge M.D.   On: 05/03/2017 12:54     ASSESSMENT AND PLAN:   Summary 76-year-old female with past medical history of epilepsy, mental retardation, hypertension, history of GERD, history of coronary artery disease, osteoarthritis of presented to the hospital due to abdominal pain and noted to have small bowel obstruction.  1. Small bowel obstruction-patient is status post exploratory laparotomy and lysis of adhesions as per surgery. Postop day #4 today. -Continue pain control and further care as per general surgery.  - Pt. Is no distress and without ab. Pain, N/V.  Started on thickened Liquids today as per surgery.  2. Urinary tract infection-continue Unasyn.  -patient's cultures are growing corynebacterium.   3. Aspiration pneumonia-now patient has been extubated -continue Unasyn X 7 days.  Await speech eval. Started on Thickened liquids today.   4. Hx of Seizures - cont. Dilantin, Phenobarb - no seizures presently. Check Dilantin level in a.m.   5. HTN - will resume Oral Metoprolol, HCTZ, Imdur now as pt. Started on diet.  - d/c IV Metoprolol.    6. Hypokalemia - improved w/ supplementation.    All the records are reviewed and case discussed with Care Management/Social Worker. Management plans discussed with the patient, family and they are in agreement.  CODE STATUS: Full code  DVT Prophylaxis: Hep. SQ  TOTAL TIME TAKING CARE OF THIS PATIENT: 25 minutes.   POSSIBLE D/C IN 2-3 DAYS, DEPENDING ON CLINICAL CONDITION.   Houston Siren M.D on 05/03/2017 at 2:09 PM  Between 7am to 6pm - Pager - [phone]  After 6pm go to [email or web address] - Scientist, research (life sciences) [facility]  Office  [phone]  CC: Primary care physician; Lauro Regulus, MD

## 2017-05-03 NOTE — Progress Notes (Signed)
MEDICATION RELATED CONSULT NOTE    Pharmacy Consult for Electrolyte monitoring Indication: hypokalemia  Allergies  Allergen Reactions  . Depakote [Valproic Acid]   . Divalproex Sodium Other (See Comments)   Intake/Output from previous day: 08/16 0701 - 08/17 0700 In: 2141.8 [I.V.:1636.8; IV Piggyback:505] Out: -  Intake/Output from this shift: Total I/O In: 1180 [P.O.:100; I.V.:1080] Out: -   Labs:  Recent Labs  05/01/17 0721 05/02/17 0330 05/03/17 1301  WBC 7.2 11.6*  --   HGB 9.0* 9.2*  --   HCT 28.8* 29.1*  --   PLT 181 245  --   CREATININE 0.96 0.78 0.91  MG 2.3  --   --    Estimated Creatinine Clearance: 39 mL/min (by C-G formula based on SCr of 0.91 mg/dL). Lab Results  Component Value Date   K 4.2 05/03/2017    Medical History: Past Medical History:  Diagnosis Date  . CAD (coronary artery disease)   . GERD (gastroesophageal reflux disease)   . History of hiatal hernia   . HTN (hypertension)   . Mental retardation   . Osteoarthritis   . Pneumococcus infection   . Seasonal allergies   . Seizure (HCC)   . Seizure [facility])     Assessment: 76 yo F s/p exp.lap surgery for SBO- postop D#3.  Goal of Therapy:  Electrolytes WNL  Plan:  K 4.2  No supplementation required at this time. Patient to begin diet today 8/17 Will f/u K+ in 2 days.   Tiya Schrupp A 05/03/2017,1:52 PM

## 2017-05-03 NOTE — Progress Notes (Signed)
Dear Doctor: This patient has been identified as a candidate for PICC for the following reason (s): drug extravasation potential with tissue necrosis (KCL, Dilantin, Dopamine, CaCl, MgSO4, chemo vesicant) and restarts due to phlebitis and infiltration in 24 hours If you agree, please write an order for the indicated device. For any questions contact the Vascular Access Team at [phone] if no answer, please leave a message.  Thank you for supporting the early vascular access assessment program.

## 2017-05-03 NOTE — Evaluation (Signed)
Physical Therapy Evaluation Patient Details Name: Hannah Vaughan MRN: [id number] DOB: 02/03/1941 Today's Date: 05/03/2017   History of Present Illness  Hannah Vaughan is a 76 y.o. female who presents with 2 day history of nausea and vomiting. Her history is complicated by history of mental retardation patient is not able to express appropriately her symptoms. Her caregiver gives her history. She has had episodes of vomiting over the last 2 days as well as complaints of abdominal pain going from her back radiating towards the front and lower abdomen. She was noticed to be more lethargic with decreased oxygen saturation and with a low-grade temperature less than 100 and was brought to the hospital for further evaluation. It was unclear although suspected that she has not had a bowel movement in multipole days. It was also unclear how much urine she had been voiding.She does have a recent history of severe UTI and has been admitted multiple times with this. She did have an admission in March 2018 for a suspected NSTEMI by cardiology believed this was more atypical chest pain rather than a true MI. No further interventions were needed and she has followed up with cardiology since then. In the emergency room, she was noted to have an elevated white blood cell count of 20.1, an elevated creatinine of 1.1 relative to baseline of 0.68 last month and a mildly elevated troponin of 0.09 although no evidence of ischemia on EKG. She also has a UTI with many bacteria and white blood cells noted on her urinalysis. She did have a CT scan which was concerning for a closed loop obstruction of small bowel in the pelvis with associated free fluid. She is now s/p exploratory laparotomy with lysis of adhesis. Pt also with aspiration PNA.   Clinical Impression  Pt admitted with above diagnosis. Pt currently with functional limitations due to the deficits listed below (see PT Problem List). Pt is unable to communicate to therapist  today. She has a history of MR and even caregivers can only intermittently understand her. She requires modA+1 with cues to roll onto her right side and assist to push up into sitting in bed. While sitting at EOB pt continually slides forward toward the edge of the bed and requires +2 assist to reposition for safety. Pt unable to follow commands appropriately to utilize walker. She requires modA+1 for sit to stand transfers. Once in standing she is able to stabilize with CGA only. Requires modA+1 to transfer with gait belt from bed to recliner. Pt responds better to caregiver who assists with transfers. Unsafe to ambulate at this time due to weakness. Pt only follows approximately 10-20% of commands. Per caregiver she is weak but not too far from her baseline. Spoke with patient's aid Hannah Vaughan as well as Catering manager of the grouop home [name] and both state that they would like to take patient back to the group home and can provide for her needs. Returning to the group home is truly the best option for the patient given her intellectual disabilities and poor command follow. She can receive HH PT at her group home. Pt will benefit from PT services to address deficits in strength, balance, and mobility in order to return to full function at home.      Follow Up Recommendations Home health PT;Other (comment) (Return to group home)    Equipment Recommendations  None recommended by PT    Recommendations for Other Services       Precautions / Restrictions Precautions  Precautions: Fall Restrictions Weight Bearing Restrictions: No      Mobility  Bed Mobility Overal bed mobility: Needs Assistance Bed Mobility: Supine to Sit     Supine to sit: Mod assist     General bed mobility comments: Pt requires modA+1 with cues to roll onto her right side and assist to push up into sitting. While sitting at EOB pt continually slides forward toward the edge of the bed and requires +2 assist to reposition for  safety  Transfers Overall transfer level: Needs assistance Equipment used: None Transfers: Sit to/from Stand Sit to Stand: Mod assist         General transfer comment: Pt unable to follow commands appropriately to utilize walker. She requires modA+1 for sit to stand transfers. Once in standing she is able to stabilize with CGA only. Requires modA+1 to transfer with gait belt from bed to recliner. Pt responds better to caregiver who assists with transfers. Unsafe to ambulate at this time due to weakness  Ambulation/Gait                Stairs            Wheelchair Mobility    Modified Rankin (Stroke Patients Only)       Balance Overall balance assessment: Needs assistance Sitting-balance support: No upper extremity supported Sitting balance-Leahy Scale: Fair     Standing balance support: No upper extremity supported Standing balance-Leahy Scale: Poor Standing balance comment: Pt requires CGA/minA+1 for balance support in standing                             Pertinent Vitals/Pain Pain Assessment: Faces Faces Pain Scale: Hurts a little bit Pain Location: Pt unable to verbalize any pain with questioning Pain Intervention(s): Monitored during session    Home Living Family/patient expects to be discharged to:: Group home Living Arrangements: Group Home Available Help at Discharge: Personal care attendant Type of Home: Group Home Home Access: Level entry     Home Layout: One level Home Equipment: Walker - 2 wheels;Shower seat;Wheelchair - [employer] - 4 wheels;Other (comment) (gait belt, no home O2)      Prior Function Level of Independence: Needs assistance   Gait / Transfers Assistance Needed: Ambulates with rollator household distances with CGA from staff using a gait belt. No falls in the last 12 months. Pt unable to answer questions during history intake; information provided by caregiver.  ADL's / Homemaking Assistance Needed: Assist  with bathing and occasional assists with feeding. She has adaptive utensils to assist with feeding        Hand Dominance   Dominant Hand: Right    Extremity/Trunk Assessment   Upper Extremity Assessment Upper Extremity Assessment: Generalized weakness    Lower Extremity Assessment Lower Extremity Assessment: Generalized weakness       Communication   Communication: Expressive difficulties  Cognition Arousal/Alertness: Awake/alert Behavior During Therapy: WFL for tasks assessed/performed Overall Cognitive Status: History of cognitive impairments - at baseline Area of Impairment: Following commands                       Following Commands: Follows one step commands inconsistently       General Comments: Pt unable to communicate to any A&O questioning. Caregiver reports that pt is at her baseline. Pt follows simple one step commands approximately 10-20% of the time.       General Comments  Exercises     Assessment/Plan    PT Assessment Patient needs continued PT services  PT Problem List Decreased strength;Decreased balance;Decreased cognition;Decreased knowledge of use of DME;Decreased safety awareness       PT Treatment Interventions Gait training;DME instruction;Functional mobility training;Therapeutic activities;Therapeutic exercise;Balance training;Patient/family education    PT Goals (Current goals can be found in the Care Plan section)  Acute Rehab PT Goals PT Goal Formulation: Patient unable to participate in goal setting    Frequency Min 2X/week   Barriers to discharge        Co-evaluation               AM-PAC PT "6 Clicks" Daily Activity  Outcome Measure Difficulty turning over in bed (including adjusting bedclothes, sheets and blankets)?: Unable Difficulty moving from lying on back to sitting on the side of the bed? : Unable Difficulty sitting down on and standing up from a chair with arms (e.g., wheelchair, bedside commode,  etc,.)?: Unable Help needed moving to and from a bed to chair (including a wheelchair)?: A Lot Help needed walking in hospital room?: A Lot Help needed climbing 3-5 steps with a railing? : Total 6 Click Score: 8    End of Session Equipment Utilized During Treatment: Gait belt Activity Tolerance: Patient tolerated treatment well Patient left: in chair;with call bell/phone within reach;with chair alarm set;with family/visitor present;Other (comment) (telesitter)   PT Visit Diagnosis: Unsteadiness on feet (R26.81);Muscle weakness (generalized) (M62.81);Difficulty in walking, not elsewhere classified (R26.2)    Time: 1007-1219 PT Time Calculation (min) (ACUTE ONLY): 43 min   Charges:   PT Evaluation $PT Eval Moderate Complexity: 1 Mod PT Treatments $Therapeutic Activity: 8-22 mins   PT G Codes:   PT G-Codes **NOT FOR INPATIENT CLASS** Functional Assessment Tool Used: AM-PAC 6 Clicks Basic Mobility Functional Limitation: Mobility: Walking and moving around Mobility: Walking and Moving Around Current Status (X5883): At least 80 percent but less than 100 percent impaired, limited or restricted Mobility: Walking and Moving Around Goal Status [phone]): At least 40 percent but less than 60 percent impaired, limited or restricted    Lynnea Maizes PT, DPT   Huprich,Jason 05/03/2017, 12:25 PM

## 2017-05-03 NOTE — Progress Notes (Signed)
SURGICAL PROGRESS NOTE  Hospital Day(s): 4.   Post op day(s): 4 Days Post-Op.   Interval History: Patient seen and examined, no acute events or new complaints overnight.  Though patient remains essentially non-verbal, one of patient's long-time caregivers again at bedside interprets patient's motions and body language as communicating she reports she is feeling better with only mild abdominal pain anddenies nausea. It is unclear whether patient is passing any flatus. No flatus has been witnessed yet by RN or caregiver.  Review of Systems: Limited to HPI as described above due to patient's baseline cognitive disability   Vital signs in last 24 hours: [min-max] current  Temp:  [97.7 F (36.5 C)-97.8 F (36.6 C)] 97.8 F (36.6 C) (08/17 0421) Pulse Rate:  [76-81] 76 (08/17 0046) Resp:  [16-20] 16 (08/17 0421) BP: (118-150)/(72-84) 150/76 (08/17 0421) SpO2:  [85 %-100 %] 95 % (08/17 0505)     Height: 5' (152.4 cm) Weight: 114 lb 3.2 oz (51.8 kg) BMI (Calculated): 22.3   Intake/Output this shift:  Total I/O In: 1080 [I.V.:1080] Out: -    Intake/Output last 2 shifts:  @IOLAST2SHIFTS @   Physical Exam:  Constitutional: alert, cooperative and no distress  HENT: normocephalic without obvious abnormality  Eyes: PERRL, EOM's grossly intact and symmetric  Neuro: CN II - XII grossly intact and symmetric without deficit  Respiratory: breathing non-labored at rest  Cardiovascular: regular rate and sinus rhythm  Gastrointestinal: soft, non-tender, and non-distended, incision well-approximated without any erythema or drainage Musculoskeletal: UE and LE FROM, unilateral RUE edema with Right arm basilic vein IV, motor and sensation grossly intact  Labs:  CBC Latest Ref Rng & Units 05/02/2017 05/01/2017 04/30/2017  WBC 3.6 - 11.0 K/uL 11.6(H) 7.2 3.5(L)  Hemoglobin 12.0 - 16.0 g/dL 4.4(B) 2.0(F) 11.2(L)  Hematocrit 35.0 - 47.0 % 29.1(L) 28.8(L) 35.2  Platelets 150 - 440 K/uL 245 181 237    CMP Latest Ref Rng & Units 05/02/2017 05/01/2017 04/30/2017  Glucose 65 - 99 mg/dL 007(H) 219(X) -  BUN 6 - 20 mg/dL 58(I) 32(P) -  Creatinine 0.44 - 1.00 mg/dL 4.98 2.64 -  Sodium 158 - 145 mmol/L 145 143 -  Potassium 3.5 - 5.1 mmol/L 4.4 3.4(L) 3.8  Chloride 101 - 111 mmol/L 109 105 -  CO2 22 - 32 mmol/L 26 30 -  Calcium 8.9 - 10.3 mg/dL 8.0(L) 8.2(L) -  Total Protein 6.5 - 8.1 g/dL - - -  Total Bilirubin 0.3 - 1.2 mg/dL - - -  Alkaline Phos 38 - 126 U/L - - -  AST 15 - 41 U/L - - -  ALT 14 - 54 U/L - - -   Imaging studies: No new pertinent imaging studies  Assessment/Plan:(ICD-10's: K56.52) 76 y.o.femaleseeming to be doing overall well since extubation yesterday, now 4DaysPost-Ops/p exploratory laparotomy with adhesiolysis andno small bowel resection,for closed loop small bowel obstruction, complicated by witnessed and additionally suspected pre-operative aspiration +comorbidities including HTN, CAD, cognitive delay, seizure disorder, GERD with hiatal hernia, and osteoarthritis.  - pain control prn - monitor abdominal exam, bowel function - will cautiously advance to nectar-thickened clear liquids - IV antibiotics for witnessed aspiration and medical management of comorbidities as per medical team             - frequent repositioning to reduce risk of pressure sores - DVT prophylaxis, out of bed, PT  All of the above findings and recommendations were discussed with the patient, patient's caregiver (who says she and her colleagues will remain at  bedside with patient throughout patient's hospital admission), and with patient's RN, and all of patient's caregiver's questions were answered to her expressed satisfaction.  -- Scherrie Gerlach Earlene Plater, MD, RPVI [place]: [facility] - Partnering for exceptional care. Office: [phone]

## 2017-05-03 NOTE — Progress Notes (Signed)
Pharmacy Antibiotic Note  Hannah Vaughan is a 76 y.o. female admitted on 04/29/2017 with UTI and a known aspiration event.  Pharmacy has been consulted for ampicillin/sulbactam (Unasyn) dosing.  Plan: Day 4-  ampicillin/sulbactam 3g every 6 hours (Unasyn).    Height: 5' (152.4 cm) Weight: 114 lb 3.2 oz (51.8 kg) IBW/kg (Calculated) : 45.5  Temp (24hrs), Avg:97.8 F (36.6 C), Min:97.7 F (36.5 C), Max:97.8 F (36.6 C)   Recent Labs Lab 04/29/17 1810 04/29/17 1843 04/30/17 0121 04/30/17 0352 04/30/17 0731 05/01/17 0721 05/02/17 0330  WBC 20.1*  --  3.5*  --   --  7.2 11.6*  CREATININE  --  1.10* 0.95  --   --  0.96 0.78  LATICACIDVEN  --   --  2.4* 3.1* 2.6*  --   --     Estimated Creatinine Clearance: 44.3 mL/min (by C-G formula based on SCr of 0.78 mg/dL).    Allergies  Allergen Reactions  . Depakote [Valproic Acid]   . Divalproex Sodium Other (See Comments)    Antimicrobials this admission: Ceftriaxone 1g 8/13>>8/13 Piperacillin/tazobactam 3.375g 8/13 >> 8/14 Ampicillin/sublactam 3g 8/14 >>   Dose adjustments this admission: Stop Zosyn. Replace with ampicillin/sublactam 3g every 6 hours. Monitor renal function; if CrCL<30 mL/min, change frequency to every 12 hours.  Microbiology results: 8/13 UCx: SENT 8/14 MRSA PCR (nasopharygeal): NEGATIVE   Thank you for allowing pharmacy to be a part of this patient's care.  [name] A 05/03/2017 12:58 PM

## 2017-05-04 ENCOUNTER — Inpatient Hospital Stay: Payer: Medicare Other

## 2017-05-04 LAB — GLUCOSE, CAPILLARY
GLUCOSE-CAPILLARY: 116 mg/dL — AB (ref 65–99)
Glucose-Capillary: 106 mg/dL — ABNORMAL HIGH (ref 65–99)
Glucose-Capillary: 125 mg/dL — ABNORMAL HIGH (ref 65–99)
Glucose-Capillary: 110 mg/dL — ABNORMAL HIGH (ref 65–99)
Glucose-Capillary: 113 mg/dL — ABNORMAL HIGH (ref 65–99)

## 2017-05-04 LAB — ALBUMIN: ALBUMIN: 2.2 g/dL — AB (ref 3.5–5.0)

## 2017-05-04 LAB — BASIC METABOLIC PANEL
Anion gap: 8 (ref 5–15)
BUN: 43 mg/dL — ABNORMAL HIGH (ref 6–20)
CALCIUM: 7.7 mg/dL — AB (ref 8.9–10.3)
CHLORIDE: 112 mmol/L — AB (ref 101–111)
CO2: 27 mmol/L (ref 22–32)
CREATININE: 0.71 mg/dL (ref 0.44–1.00)
GFR calc non Af Amer: >60 mL/min (ref >60)
GLUCOSE: 122 mg/dL — AB (ref 65–99)
Potassium: 3.6 mmol/L (ref 3.5–5.1)
Sodium: 147 mmol/L — ABNORMAL HIGH (ref 135–145)

## 2017-05-04 LAB — PHENYTOIN LEVEL, TOTAL: Phenytoin Lvl: 13.5 ug/mL (ref 10.0–20.0)

## 2017-05-04 MED ORDER — PHENYTOIN SODIUM 50 MG/ML IJ SOLN
100.0000 mg | Freq: Three times a day (TID) | INTRAMUSCULAR | Status: DC
  Administered 2017-05-04 – 2017-05-05 (×3): 100 mg via INTRAVENOUS
  Filled 2017-05-04 (×4): qty 2

## 2017-05-04 NOTE — Progress Notes (Signed)
[employer] - [place] [facility]   PATIENT NAME: Hannah Vaughan    MR#:  [id number]  DATE OF BIRTH:  08/12/1941  SUBJECTIVE:   Patient here due to small bowel obstruction is status post expiratory laparotomy with lysis of adhesions. Postop day # 5 today.  Started on thickened liquids today.  Caretaker at bedside.     REVIEW OF SYSTEMS:    Review of Systems  Unable to perform ROS: Mental acuity    Nutrition: Thickened liquids Tolerating Diet: Yes Tolerating PT: from facility  DRUG ALLERGIES:   Allergies  Allergen Reactions  . Depakote [Valproic Acid]   . Divalproex Sodium Other (See Comments)    VITALS:  Blood pressure (!) 140/91, pulse 85, temperature (!) 97.5 F (36.4 C), temperature source Oral, resp. rate 20, height 5' (1.524 m), weight 51.8 kg (114 lb 3.2 oz), SpO2 95 %.  PHYSICAL EXAMINATION:   Physical Exam  GENERAL:  76 y.o.-year-old patient lying in bed nonverbal in NAD.  EYES: Pupils equal, round, reactive to light and accommodation. No scleral icterus. Extraocular muscles intact.  HEENT: Head atraumatic, normocephalic. Oropharynx and nasopharynx clear.  NECK:  Supple, no jugular venous distention. No thyroid enlargement, no tenderness.  LUNGS: Normal breath sounds bilaterally, no wheezing, rales, rhonchi. No use of accessory muscles of respiration.  CARDIOVASCULAR: S1, S2 normal. No murmurs, rubs, or gallops.  ABDOMEN: Soft, nontender, nondistended. Bowel sounds present. No organomegaly or mass. Positive mid abdominal dressing in place with no acute drainage noted. EXTREMITIES: No cyanosis, clubbing or edema b/l. Right upper ext swelling.      NEUROLOGIC: Cranial nerves II through XII are intact. No focal Motor or sensory deficits b/l. Globally weak PSYCHIATRIC: The patient is alert and oriented x 1.  SKIN: No obvious rash, lesion, or ulcer.    LABORATORY PANEL:   CBC  Recent Labs Lab 05/02/17 0330  WBC 11.6*  HGB 9.2*  HCT 29.1*   PLT 245   ------------------------------------------------------------------------------------------------------------------  Chemistries   Recent Labs Lab 04/29/17 1843  05/01/17 0721  05/04/17 0502  NA 143  < > 143  < > 147*  K 3.1*  < > 3.4*  < > 3.6  CL 95*  < > 105  < > 112*  CO2 35*  < > 30  < > 27  GLUCOSE 143*  < > 117*  < > 122*  BUN 47*  < > 37*  < > 43*  CREATININE 1.10*  < > 0.96  < > 0.71  CALCIUM 9.7  < > 8.2*  < > 7.7*  MG  --   < > 2.3  --   --   AST 24  --   --   --   --   ALT 25  --   --   --   --   ALKPHOS 141*  --   --   --   --   BILITOT 0.9  --   --   --   --   < > = values in this interval not displayed. ------------------------------------------------------------------------------------------------------------------  Cardiac Enzymes  Recent Labs Lab 04/30/17 0731  TROPONINI 0.07*   ------------------------------------------------------------------------------------------------------------------  RADIOLOGY:  Dg Abd 1 View  Result Date: 05/04/2017 CLINICAL DATA:  Follow-up ileus. EXAM: ABDOMEN - 1 VIEW COMPARISON:  Abdominal radiograph May 01, 2017 FINDINGS: Multiple loops of dilated small bowel to at least 3.6 cm. Paucity of large bowel gas. Interval removal of nasogastric tube. Surgical clips in the included right  abdomen compatible with cholecystectomy. Phleboliths in the pelvis. No mass-effect. Laparotomy staples. Osseous structures are nonsuspicious. IMPRESSION: Dilated small bowel concerning for earlier/ partial small bowel obstruction, less likely ileus given paucity of large bowel gas. Electronically Signed   By: Awilda Metro M.D.   On: 05/04/2017 04:40   US Venous Img Upper Uni Right  Result Date: 05/03/2017 CLINICAL DATA:  Right upper extremity swelling. EXAM: RIGHT UPPER EXTREMITY VENOUS DOPPLER ULTRASOUND TECHNIQUE: Gray-scale sonography with graded compression, as well as color Doppler and duplex ultrasound were performed to  evaluate the upper extremity deep venous system from the level of the subclavian vein and including the jugular, axillary, basilic, radial, ulnar and upper cephalic vein. Spectral Doppler was utilized to evaluate flow at rest and with distal augmentation maneuvers. COMPARISON:  None. FINDINGS: Contralateral Subclavian Vein: Respiratory phasicity is normal and symmetric with the symptomatic side. No evidence of thrombus. Normal compressibility. Internal Jugular Vein: No evidence of thrombus. Normal compressibility, respiratory phasicity and response to augmentation. Subclavian Vein: No evidence of thrombus. Normal compressibility, respiratory phasicity and response to augmentation. Axillary Vein: No evidence of thrombus. Normal compressibility, respiratory phasicity and response to augmentation. Cephalic Vein: No evidence of thrombus. Normal compressibility, respiratory phasicity and response to augmentation. Basilic Vein: No evidence of thrombus. Normal compressibility, respiratory phasicity and response to augmentation. Brachial Veins: No evidence of thrombus. Normal compressibility, respiratory phasicity and response to augmentation. Radial Veins: No evidence of thrombus. Normal compressibility, respiratory phasicity and response to augmentation. Ulnar Veins: No evidence of thrombus. Normal compressibility, respiratory phasicity and response to augmentation. Venous Reflux:  None visualized. Other Findings:  None visualized. IMPRESSION: No evidence of DVT within the right upper extremity. Electronically Signed   By: Obie Dredge M.D.   On: 05/03/2017 12:54     ASSESSMENT AND PLAN:   Summary 76-year-old female with past medical history of epilepsy, mental retardation, hypertension, history of GERD, history of coronary artery disease, osteoarthritis of presented to the hospital due to abdominal pain and noted to have small bowel obstruction.  1. Small bowel obstruction-patient is status post exploratory  laparotomy and lysis of adhesions as per surgery. Postop day #4 today. -Continue pain control and further care as per general surgery.  - Pt. Is no distress and without ab. Pain, N/V.  Started on thickened Liquids today as per surgery.  2. Urinary tract infection-continue Unasyn.  -patient's cultures are growing corynebacterium.   3. Aspiration pneumonia-now patient has been extubated -continue Unasyn X 7 days.  Await speech eval. Started on Thickened liquids today.   4. Hx of Seizures - cont. Dilantin, Phenobarb - no seizures presently.  Dilantin level 13.3 on 8/18  5. HTN - will resume Oral Metoprolol, HCTZ, Imdur now as pt. Started on diet.  - d/c IV Metoprolol.   6. Hypokalemia - improved w/ supplementation.   Medically stable.  All the records are reviewed and case discussed with Care Management/Social Worker. Management plans discussed with the patient, family and they are in agreement.  CODE STATUS: Full code  DVT Prophylaxis: Hep. SQ  TOTAL TIME TAKING CARE OF THIS PATIENT: 25 minutes.   POSSIBLE D/C IN 2-3 DAYS, DEPENDING ON CLINICAL CONDITION.   Miko Markwood M.D on 05/04/2017 at 12:28 PM  Between 7am to 6pm - Pager - [phone]  After 6pm go to [email or web address] - Scientist, research (life sciences) [facility]  Office  [phone]  CC: Primary care physician; Lauro Regulus, MD

## 2017-05-04 NOTE — Progress Notes (Signed)
[facility] for Phenytoin Dosing  Allergies  Allergen Reactions  . Depakote [Valproic Acid]   . Divalproex Sodium Other (See Comments)    Patient Measurements: Height: 5' (152.4 cm) Weight: 114 lb 3.2 oz (51.8 kg) IBW/kg (Calculated) : 45.5  Vital Signs: Temp: 97.5 F (36.4 C) (08/18 0337) Temp Source: Oral (08/18 0337) BP: 130/76 (08/18 0337) Pulse Rate: 86 (08/18 0337) Intake/Output from previous day: 08/17 0701 - 08/18 0700 In: 3094.3 [P.O.:200; I.V.:2291.8; IV Piggyback:602.5] Out: -  Intake/Output from this shift: No intake/output data recorded.  Labs:  Recent Labs  05/02/17 0330 05/03/17 1301 05/04/17 0502  WBC 11.6*  --   --   HGB 9.2*  --   --   HCT 29.1*  --   --   PLT 245  --   --   CREATININE 0.78 0.91 0.71  ALBUMIN  --   --  2.2*   Estimated Creatinine Clearance: 44.3 mL/min (by C-G formula based on SCr of 0.71 mg/dL).   Phenytoin Lvl (ug/mL)  Date Value  05/04/2017 13.5    Medications:  Scheduled:  . chlorhexidine gluconate (MEDLINE KIT)  15 mL Mouth Rinse BID  . enoxaparin (LOVENOX) injection  40 mg Subcutaneous Q24H  . feeding supplement  1 Container Oral TID BM  . hydrochlorothiazide  25 mg Oral Daily  . insulin aspart  0-9 Units Subcutaneous Q4H  . isosorbide mononitrate  60 mg Oral Daily  . ketorolac  15 mg Intravenous Q6H  . metoprolol succinate  25 mg Oral Daily  . pantoprazole (PROTONIX) IV  40 mg Intravenous QHS  . PHENObarbital  65 mg Intravenous QHS  . phenytoin (DILANTIN) IV  100 mg Intravenous Q8H   Infusions:  . ampicillin-sulbactam (UNASYN) IV Stopped (05/04/17 0535)  . dextrose 5 % and 0.45 % NaCl with KCl 20 mEq/L 75 mL/hr at 05/03/17 1748    Assessment: Patient has known history of seizure ([place]), and patient takes 100 mg phenytoin ER capsule three times daily at home. Patient is currently on phenobarbital 65 mg daily injection, and patient's albumin observed at 3.2 on 8/13.  Patient has had episodes of vomiting, abdominal pain since 8/11. Patient was initiated on phenytoin 100 mg IV every 8 hours. Patient's 8/14 phenytoin level came back at 4.8.  Corrected phenytoin= 25 mcg/ml  Goal of Therapy:  Total phenytoin levels between 10-20 mg/L  Plan:  Will skip a dose and reduce dosing to phenytoin 100 mg iv q 8 hours. Will recheck phenytoin level in a few days.   Ulice Dash D 05/04/2017,7:32 AM

## 2017-05-04 NOTE — Progress Notes (Signed)
SURGICAL PROGRESS NOTE  Hospital Day(s): 5.   Post op day(s): 5 Days Post-Op.   Interval History: Patient seen and examined, no acute events or new complaints overnight.  Though patient remainsessentially non-verbal, one of patient's long-time caregivers again at bedsideinterprets patient's motions and body language as communicating she reports she continues to feel well with only mild abdominal pain anddenies nausea. It is unclear whether patient is passing any flatus. No flatus has been witnessed yet by RN or caregiver. Abdominal x-ray performed this morning.  Review of Systems: Limited to HPI as described above due to patient's baseline cognitive disability  Vital signs in last 24 hours: [min-max] current  Temp:  [97.4 F (36.3 C)-97.5 F (36.4 C)] 97.5 F (36.4 C) (08/18 0337) Pulse Rate:  [85-86] 86 (08/18 0337) Resp:  [16-20] 20 (08/18 0337) BP: (122-150)/(52-76) 130/76 (08/18 0337) SpO2:  [91 %-99 %] 95 % (08/18 0337)     Height: 5' (152.4 cm) Weight: 114 lb 3.2 oz (51.8 kg) BMI (Calculated): 22.3   Intake/Output this shift:  No intake/output data recorded.   Intake/Output last 2 shifts:  @IOLAST2SHIFTS @   Physical Exam:  Constitutional: alert, cooperative and no distress  HENT: normocephalic without obvious abnormality  Eyes: PERRL, EOM's grossly intact and symmetric  Neuro: CN II - XII grossly intact and symmetric without deficit  Respiratory: breathing non-labored at rest  Cardiovascular: regular rate and sinus rhythm  Gastrointestinal: soft, non-tender, and non-distended, incision well-approximated without any erythema or drainage  Musculoskeletal: UE and LE FROM, improved RUE edema, motor and sensation grossly intact, NT   Labs:  CBC Latest Ref Rng & Units 05/02/2017 05/01/2017 04/30/2017  WBC 3.6 - 11.0 K/uL 11.6(H) 7.2 3.5(L)  Hemoglobin 12.0 - 16.0 g/dL 1.6(X) 0.9(U) 11.2(L)  Hematocrit 35.0 - 47.0 % 29.1(L) 28.8(L) 35.2  Platelets 150 - 440 K/uL 245 181  237   CMP Latest Ref Rng & Units 05/04/2017 05/03/2017 05/02/2017  Glucose 65 - 99 mg/dL 045(W) 098(J) 191(Y)  BUN 6 - 20 mg/dL 78(G) 95(A) 21(H)  Creatinine 0.44 - 1.00 mg/dL 0.86 5.78 4.69  Sodium 135 - 145 mmol/L 147(H) 145 145  Potassium 3.5 - 5.1 mmol/L 3.6 4.2 4.4  Chloride 101 - 111 mmol/L 112(H) 109 109  CO2 22 - 32 mmol/L 27 25 26   Calcium 8.9 - 10.3 mg/dL 7.7(L) 8.2(L) 8.0(L)  Total Protein 6.5 - 8.1 g/dL - - -  Total Bilirubin 0.3 - 1.2 mg/dL - - -  Alkaline Phos 38 - 126 U/L - - -  AST 15 - 41 U/L - - -  ALT 14 - 54 U/L - - -   Imaging studies:  Abdominal x-ray (05/04/2017) Multiple loops of dilated small bowel to at least 3.6 cm. Paucity of large bowel gas. Interval removal of nasogastric tube. Surgical clips in the included right abdomen compatible with cholecystectomy. Phleboliths in the pelvis. No mass-effect. Laparotomy staples. Osseous structures are nonsuspicious.  Assessment/Plan:(ICD-10's: K82.52) 76 y.o.femaleseemingly doing overall well with slowly resolving post-operative ileus, 5DaysPost-Opand extubated in ICU on POD#1 s/p exploratory laparotomy with adhesiolysis andno small bowel resectionfor closed loop small bowel obstruction, complicated by witnessed and additionally suspected pre-operative aspiration as well ascomorbidities including HTN, CAD, cognitive delay, seizure disorder, GERD with hiatal hernia, and osteoarthritis.  - pain control prn - monitor abdominal exam, bowel function - will continue nectar-thickened clear liquids prn until increased bowel function and/or improved KUB  - IV antibiotics for witnessed aspiration andmedical management of comorbidities as per medical team -  frequent repositioning to reduce risk of pressure sores - DVT prophylaxis, out of bed, PT  All of the above findings and recommendations were discussed with the patient, patient's caregiver  (who says she and her colleagues will remain at bedside with patient throughout patient's hospital admission), and with patient's RN, and all of patient's caregiver's questions were answered to her expressed satisfaction.  -- Scherrie Gerlach Earlene Plater, MD, RPVI [place]: [facility] - Partnering for exceptional care. Office: [phone]

## 2017-05-04 NOTE — Clinical Social Work Note (Signed)
CSW saw during chart review that patient will most likely be ready for discharge either Sunday or Monday. CSW will continue to follow.   Argentina Ponder, MSW, Theresia Majors [phone]

## 2017-05-05 ENCOUNTER — Inpatient Hospital Stay: Payer: Medicare Other

## 2017-05-05 LAB — BASIC METABOLIC PANEL WITH GFR
Anion gap: 5 (ref 5–15)
BUN: 23 mg/dL — ABNORMAL HIGH (ref 6–20)
CO2: 27 mmol/L (ref 22–32)
Calcium: 7.8 mg/dL — ABNORMAL LOW (ref 8.9–10.3)
Chloride: 109 mmol/L (ref 101–111)
Creatinine, Ser: 0.46 mg/dL (ref 0.44–1.00)
GFR calc Af Amer: >60 mL/min
GFR calc non Af Amer: >60 mL/min
Glucose, Bld: 89 mg/dL (ref 65–99)
Potassium: 3.6 mmol/L (ref 3.5–5.1)
Sodium: 141 mmol/L (ref 135–145)

## 2017-05-05 LAB — GLUCOSE, CAPILLARY
GLUCOSE-CAPILLARY: 85 mg/dL (ref 65–99)
Glucose-Capillary: 121 mg/dL — ABNORMAL HIGH (ref 65–99)
Glucose-Capillary: 126 mg/dL — ABNORMAL HIGH (ref 65–99)
Glucose-Capillary: 86 mg/dL (ref 65–99)
Glucose-Capillary: 90 mg/dL (ref 65–99)
Glucose-Capillary: 75 mg/dL (ref 65–99)

## 2017-05-05 MED ORDER — ASPIRIN EC 81 MG PO TBEC
81.0000 mg | DELAYED_RELEASE_TABLET | Freq: Every day | ORAL | Status: DC
  Administered 2017-05-05 – 2017-05-09 (×5): 81 mg via ORAL
  Filled 2017-05-05 (×5): qty 1

## 2017-05-05 MED ORDER — PHENOBARBITAL 20 MG/5ML PO ELIX
60.0000 mg | ORAL_SOLUTION | Freq: Every day | ORAL | Status: DC
  Administered 2017-05-05 – 2017-05-08 (×4): 60 mg via ORAL
  Filled 2017-05-05 (×8): qty 15

## 2017-05-05 MED ORDER — AMOXICILLIN-POT CLAVULANATE 875-125 MG PO TABS
1.0000 | ORAL_TABLET | Freq: Two times a day (BID) | ORAL | Status: DC
  Administered 2017-05-05 – 2017-05-09 (×9): 1 via ORAL
  Filled 2017-05-05 (×9): qty 1

## 2017-05-05 MED ORDER — GABAPENTIN 600 MG PO TABS
600.0000 mg | ORAL_TABLET | Freq: Four times a day (QID) | ORAL | Status: DC
  Administered 2017-05-05 – 2017-05-09 (×16): 600 mg via ORAL
  Filled 2017-05-05 (×16): qty 1

## 2017-05-05 MED ORDER — POLYSACCHARIDE IRON COMPLEX 150 MG PO CAPS
150.0000 mg | ORAL_CAPSULE | Freq: Every day | ORAL | Status: DC
  Administered 2017-05-05 – 2017-05-09 (×4): 150 mg via ORAL
  Filled 2017-05-05 (×5): qty 1

## 2017-05-05 MED ORDER — PHENYTOIN SODIUM EXTENDED 100 MG PO CAPS
100.0000 mg | ORAL_CAPSULE | Freq: Three times a day (TID) | ORAL | Status: DC
  Administered 2017-05-05 – 2017-05-09 (×13): 100 mg via ORAL
  Filled 2017-05-05 (×15): qty 1

## 2017-05-05 MED ORDER — PANTOPRAZOLE SODIUM 40 MG PO TBEC
40.0000 mg | DELAYED_RELEASE_TABLET | Freq: Every day | ORAL | Status: DC
  Administered 2017-05-05 – 2017-05-09 (×5): 40 mg via ORAL
  Filled 2017-05-05 (×5): qty 1

## 2017-05-05 MED ORDER — TRAMADOL HCL 50 MG PO TABS: 50.0000 mg | ORAL_TABLET | Freq: Four times a day (QID) | ORAL | Status: DC | PRN

## 2017-05-05 NOTE — Progress Notes (Signed)
Dr patel gave order to change CBG from every 4 hours to tid

## 2017-05-05 NOTE — Progress Notes (Signed)
Order given for met b

## 2017-05-05 NOTE — Progress Notes (Signed)
MEDICATION RELATED CONSULT NOTE    Pharmacy Consult for Electrolyte monitoring Indication: hypokalemia  Allergies  Allergen Reactions  . Depakote [Valproic Acid]   . Divalproex Sodium Other (See Comments)   Intake/Output from previous day: 08/18 0701 - 08/19 0700 In: 2333.1 [P.O.:1230; I.V.:924; IV Piggyback:179.1] Out: -  Intake/Output from this shift: No intake/output data recorded.  Labs:  Recent Labs  05/03/17 1301 05/04/17 0502 05/05/17 0820  CREATININE 0.91 0.71 0.46  ALBUMIN  --  2.2*  --    Estimated Creatinine Clearance: 44.3 mL/min (by C-G formula based on SCr of 0.46 mg/dL). Lab Results  Component Value Date   K 3.6 05/05/2017    Medical History: Past Medical History:  Diagnosis Date  . CAD (coronary artery disease)   . GERD (gastroesophageal reflux disease)   . History of hiatal hernia   . HTN (hypertension)   . Mental retardation   . Osteoarthritis   . Pneumococcus infection   . Seasonal allergies   . Seizure ([facility])   . Seizure [facility])     Assessment: 76 yo F s/p exp.lap surgery for SBO- postop D#6.  Goal of Therapy:  Electrolytes WNL  Plan:  K 3.6 No supplementation needed. Pt electolytes has been stable. Pharmacy will sign off  Olene Floss, Pharm.D, BCPS Clinical Pharmacist  05/05/2017,2:34 PM

## 2017-05-05 NOTE — Progress Notes (Addendum)
SURGICAL PROGRESS NOTE  Hospital Day(s): 6.   Post op day(s): 6 Days Post-Op.   Interval History: Patient seen and examined, no acute events or new complaints overnight.  Though patient remainsessentially non-verbal, her overnight RN apparently documented hearing a single episode of flatus with none further since. Patient's long-time caregivers were not available at bedside during either visit to patient's room today. It is unclear whether patient is passing any flatus. No flatus has been witnessed yet by RN today. Abdominal x-ray performed this morning.  Review of Systems: Limited to HPI as described above due to baseline cognitive disability   Vital signs in last 24 hours: [min-max] current  Temp:  [97.5 F (36.4 C)-98.6 F (37 C)] 97.6 F (36.4 C) (08/19 0600) Pulse Rate:  [85-89] 87 (08/19 0417) Resp:  [16] 16 (08/19 0417) BP: (126-146)/(81-91) 133/81 (08/19 0417) SpO2:  [95 %-100 %] 95 % (08/19 0417)     Height: 5' (152.4 cm) Weight: 114 lb 3.2 oz (51.8 kg) BMI (Calculated): 22.3   Intake/Output this shift:  No intake/output data recorded.   Intake/Output last 2 shifts:  @IOLAST2SHIFTS @   Physical Exam:  Constitutional: alert, cooperative and no distress  HENT: normocephalic without obvious abnormality  Eyes: PERRL, EOM's grossly intact and symmetric  Neuro: CN II - XII grossly intact and symmetric without deficit  Respiratory: breathing non-labored at rest  Cardiovascular: regular rate and sinus rhythm  Gastrointestinal: soft, non-tender, and non-distended, incision well-approximated without any erythema or drainage Musculoskeletal: UE and LE FROM, motor and sensation grossly intact, NT   Labs:  CBC Latest Ref Rng & Units 05/02/2017 05/01/2017 04/30/2017  WBC 3.6 - 11.0 K/uL 11.6(H) 7.2 3.5(L)  Hemoglobin 12.0 - 16.0 g/dL 0.1(V) 6.1(B) 11.2(L)  Hematocrit 35.0 - 47.0 % 29.1(L) 28.8(L) 35.2  Platelets 150 - 440 K/uL 245 181 237   CMP Latest Ref Rng & Units  05/04/2017 05/03/2017 05/02/2017  Glucose 65 - 99 mg/dL 379(K) 327(M) 147(W)  BUN 6 - 20 mg/dL 92(H) 57(M) 73(U)  Creatinine 0.44 - 1.00 mg/dL 0.37 0.96 4.38  Sodium 135 - 145 mmol/L 147(H) 145 145  Potassium 3.5 - 5.1 mmol/L 3.6 4.2 4.4  Chloride 101 - 111 mmol/L 112(H) 109 109  CO2 22 - 32 mmol/L 27 25 26   Calcium 8.9 - 10.3 mg/dL 7.7(L) 8.2(L) 8.0(L)  Total Protein 6.5 - 8.1 g/dL - - -  Total Bilirubin 0.3 - 1.2 mg/dL - - -  Alkaline Phos 38 - 126 U/L - - -  AST 15 - 41 U/L - - -  ALT 14 - 54 U/L - - -   Imaging studies: No new pertinent imaging studies   Assessment/Plan:(ICD-10's: K56.52) 76 y.o.femaleseemingly doing overall well with slowly resolving post-operative ileus, 6DaysPost-Opand extubated in ICU on POD#1 s/p exploratory laparotomy with adhesiolysis andno small bowel resectionfor closed loop small bowel obstruction, complicated by witnessed and additionally suspected pre-operative aspiration as well ascomorbidities including HTN, CAD, cognitive delay, seizure disorder, GERD with hiatal hernia, and osteoarthritis.  - pain control prn - monitor abdominal exam, bowel function - will continue nectar-thickened clear liquids prn until increased bowel function and/or improved KUB  - IV antibiotics for witnessed aspiration andmedical management of comorbidities as per medical team - frequent repositioning to reduce risk of pressure sores - DVT prophylaxis, out of bed, PT  All of the above findings and recommendations were discussed with the patient and with patient's RN.  -- Scherrie Gerlach. Earlene Plater, MD, RPVI [place]: [facility]  [facility] - Partnering for exceptional care. Office: [phone]

## 2017-05-05 NOTE — Progress Notes (Signed)
[employer] [facility] [place] [facility]   PATIENT NAME: Hannah Vaughan    MR#:  [id number]  DATE OF BIRTH:  Feb 19, 1941  SUBJECTIVE:   Patient here due to small bowel obstruction is status post expiratory laparotomy with lysis of adhesions. Postop day # 6 today.  Started on thickened liquids today.     REVIEW OF SYSTEMS:    Review of Systems  Unable to perform ROS: Mental acuity    Nutrition: Thickened liquids Tolerating Diet: Yes Tolerating PT: from facility  DRUG ALLERGIES:   Allergies  Allergen Reactions  . Depakote [Valproic Acid]   . Divalproex Sodium Other (See Comments)    VITALS:  Blood pressure 133/81, pulse 87, temperature 97.6 F (36.4 C), resp. rate 16, height 5' (1.524 m), weight 51.8 kg (114 lb 3.2 oz), SpO2 95 %.  PHYSICAL EXAMINATION:   Physical Exam  GENERAL:  76 y.o.-year-old patient lying in bed nonverbal in NAD.  EYES: Pupils equal, round, reactive to light and accommodation. No scleral icterus. Extraocular muscles intact.  HEENT: Head atraumatic, normocephalic. Oropharynx and nasopharynx clear.  NECK:  Supple, no jugular venous distention. No thyroid enlargement, no tenderness.  LUNGS: Normal breath sounds bilaterally, no wheezing, rales, rhonchi. No use of accessory muscles of respiration.  CARDIOVASCULAR: S1, S2 normal. No murmurs, rubs, or gallops.  ABDOMEN: Soft, nontender, nondistended. Bowel sounds present. No organomegaly or mass. Positive mid abdominal dressing in place with no acute drainage noted. EXTREMITIES: No cyanosis, clubbing or edema b/l. Right upper ext swelling.      NEUROLOGIC: Cranial nerves II through XII are intact. No focal Motor or sensory deficits b/l. Globally weak PSYCHIATRIC: The patient is alert and oriented x 1.  SKIN: No obvious rash, lesion, or ulcer.    LABORATORY PANEL:   CBC  Recent Labs Lab 05/02/17 0330  WBC 11.6*  HGB 9.2*  HCT 29.1*  PLT 245    ------------------------------------------------------------------------------------------------------------------  Chemistries   Recent Labs Lab 04/29/17 1843  05/01/17 0721  05/05/17 0820  NA 143  < > 143  < > 141  K 3.1*  < > 3.4*  < > 3.6  CL 95*  < > 105  < > 109  CO2 35*  < > 30  < > 27  GLUCOSE 143*  < > 117*  < > 89  BUN 47*  < > 37*  < > 23*  CREATININE 1.10*  < > 0.96  < > 0.46  CALCIUM 9.7  < > 8.2*  < > 7.8*  MG  --   < > 2.3  --   --   AST 24  --   --   --   --   ALT 25  --   --   --   --   ALKPHOS 141*  --   --   --   --   BILITOT 0.9  --   --   --   --   < > = values in this interval not displayed. ------------------------------------------------------------------------------------------------------------------  Cardiac Enzymes  Recent Labs Lab 04/30/17 0731  TROPONINI 0.07*   ------------------------------------------------------------------------------------------------------------------  RADIOLOGY:  Dg Abd 1 View  Result Date: 05/05/2017 CLINICAL DATA:  Ileus.  Follow-up. EXAM: ABDOMEN - 1 VIEW COMPARISON:  May 04, 2017 FINDINGS: Air-filled mildly dilated loops of small bowel are stable in the interval. There is a paucity of colonic gas. Distal small bowel loops appear more normal in caliber. Skin staples are identified. No other acute interval changes.  IMPRESSION: Mild small bowel dilatation with decompression of the colon is suggestive of an early or mild small bowel obstruction. No significant oval change. Electronically Signed   By: Gerome Sam III M.D   On: 05/05/2017 07:09   Dg Abd 1 View  Result Date: 05/04/2017 CLINICAL DATA:  Follow-up ileus. EXAM: ABDOMEN - 1 VIEW COMPARISON:  Abdominal radiograph May 01, 2017 FINDINGS: Multiple loops of dilated small bowel to at least 3.6 cm. Paucity of large bowel gas. Interval removal of nasogastric tube. Surgical clips in the included right abdomen compatible with cholecystectomy. Phleboliths in  the pelvis. No mass-effect. Laparotomy staples. Osseous structures are nonsuspicious. IMPRESSION: Dilated small bowel concerning for earlier/ partial small bowel obstruction, less likely ileus given paucity of large bowel gas. Electronically Signed   By: Awilda Metro M.D.   On: 05/04/2017 04:40   US Venous Img Upper Uni Right  Result Date: 05/03/2017 CLINICAL DATA:  Right upper extremity swelling. EXAM: RIGHT UPPER EXTREMITY VENOUS DOPPLER ULTRASOUND TECHNIQUE: Gray-scale sonography with graded compression, as well as color Doppler and duplex ultrasound were performed to evaluate the upper extremity deep venous system from the level of the subclavian vein and including the jugular, axillary, basilic, radial, ulnar and upper cephalic vein. Spectral Doppler was utilized to evaluate flow at rest and with distal augmentation maneuvers. COMPARISON:  None. FINDINGS: Contralateral Subclavian Vein: Respiratory phasicity is normal and symmetric with the symptomatic side. No evidence of thrombus. Normal compressibility. Internal Jugular Vein: No evidence of thrombus. Normal compressibility, respiratory phasicity and response to augmentation. Subclavian Vein: No evidence of thrombus. Normal compressibility, respiratory phasicity and response to augmentation. Axillary Vein: No evidence of thrombus. Normal compressibility, respiratory phasicity and response to augmentation. Cephalic Vein: No evidence of thrombus. Normal compressibility, respiratory phasicity and response to augmentation. Basilic Vein: No evidence of thrombus. Normal compressibility, respiratory phasicity and response to augmentation. Brachial Veins: No evidence of thrombus. Normal compressibility, respiratory phasicity and response to augmentation. Radial Veins: No evidence of thrombus. Normal compressibility, respiratory phasicity and response to augmentation. Ulnar Veins: No evidence of thrombus. Normal compressibility, respiratory phasicity and  response to augmentation. Venous Reflux:  None visualized. Other Findings:  None visualized. IMPRESSION: No evidence of DVT within the right upper extremity. Electronically Signed   By: Obie Dredge M.D.   On: 05/03/2017 12:54     ASSESSMENT AND PLAN:   76 year-old female with past medical history of epilepsy, mental retardation, hypertension, history of GERD, history of coronary artery disease, osteoarthritis of presented to the hospital due to abdominal pain and noted to have small bowel obstruction.  1. Small bowel obstruction-patient is status post exploratory laparotomy and lysis of adhesions as per surgery. Postop day #  6 today. -Continue pain control and further care as per general surgery.  - Pt. Is no distress and without ab. Pain, N/V.  Started on thickened Liquids today as per surgery.  2. Urinary tract infection-continue Unasyn.--- change to augmentin -patient's cultures are growing corynebacterium.   3. Aspiration pneumonia-now patient has been extubated -continue augmentin X 7 days.   - Started on Thickened liquids today.   4. Hx of Seizures - cont. Dilantin, Phenobarb - no seizures presently.  Dilantin level 13.3 on 8/18  5. HTN - will resume Oral Metoprolol, HCTZ, Imdur now as pt. Started on diet.  - d/c IV Metoprolol.   6. Hypokalemia - improved w/ supplementation.  Labs look ok today. No need for IVF  Medically stable. Will sign off call if  needed.  All the records are reviewed and case discussed with Care Management/Social Worker. Management plans discussed with the patient, family and they are in agreement.  CODE STATUS: Full code  DVT Prophylaxis: Hep. SQ  TOTAL TIME TAKING CARE OF THIS PATIENT: 25 minutes.      Syler Norcia M.D on 05/05/2017 at 11:23 AM  Between 7am to 6pm - Pager - [phone]  After 6pm go to [email or web address] - Scientist, research (life sciences) [facility]  Office  [phone]  CC: Primary care physician;  Lauro Regulus, MD

## 2017-05-05 NOTE — Clinical Social Work Note (Signed)
CSW received call from [facility] for update on the patient. CSW advised that the patient may be ready for discharge tomorrow. Cordelia Pen (RN for [employer]) plans to visit with the patient today to begin readmission assessment. Currently, the only concern for discharge back to the group home is that the patient have a bowel movement. CSW will continue to follow.  Argentina Ponder, MSW, Theresia Majors [phone]

## 2017-05-06 ENCOUNTER — Inpatient Hospital Stay: Payer: Medicare Other

## 2017-05-06 ENCOUNTER — Encounter: Payer: Self-pay | Admitting: Radiology

## 2017-05-06 LAB — CBC WITH DIFFERENTIAL/PLATELET
BASOS ABS: 0.1 10*3/uL (ref 0–0.1)
Basophils Relative: 1 %
Eosinophils Absolute: 0.4 10*3/uL (ref 0–0.7)
Eosinophils Relative: 3 %
HEMATOCRIT: 37.2 % (ref 35.0–47.0)
Hemoglobin: 11.7 g/dL — ABNORMAL LOW (ref 12.0–16.0)
LYMPHS PCT: 17 %
Lymphs Abs: 1.9 10*3/uL (ref 1.0–3.6)
MCH: 23.9 pg — ABNORMAL LOW (ref 26.0–34.0)
MCHC: 31.4 g/dL — ABNORMAL LOW (ref 32.0–36.0)
MCV: 76 fL — AB (ref 80.0–100.0)
MONO ABS: 0.6 10*3/uL (ref 0.2–0.9)
MONOS PCT: 5 %
NEUTROS ABS: 8.2 10*3/uL — AB (ref 1.4–6.5)
Neutrophils Relative %: 74 %
Platelets: 295 10*3/uL (ref 150–440)
RBC: 4.89 MIL/uL (ref 3.80–5.20)
RDW: 20.7 % — ABNORMAL HIGH (ref 11.5–14.5)
WBC: 11.2 10*3/uL — ABNORMAL HIGH (ref 3.6–11.0)

## 2017-05-06 LAB — BASIC METABOLIC PANEL
ANION GAP: 6 (ref 5–15)
BUN: 19 mg/dL (ref 6–20)
CALCIUM: 7.6 mg/dL — AB (ref 8.9–10.3)
CO2: 26 mmol/L (ref 22–32)
Chloride: 109 mmol/L (ref 101–111)
Creatinine, Ser: 0.62 mg/dL (ref 0.44–1.00)
GFR calc Af Amer: >60 mL/min (ref >60)
GFR calc non Af Amer: >60 mL/min (ref >60)
GLUCOSE: 101 mg/dL — AB (ref 65–99)
Potassium: 3.9 mmol/L (ref 3.5–5.1)
Sodium: 141 mmol/L (ref 135–145)

## 2017-05-06 LAB — GLUCOSE, CAPILLARY
GLUCOSE-CAPILLARY: 89 mg/dL (ref 65–99)
GLUCOSE-CAPILLARY: 70 mg/dL (ref 65–99)
Glucose-Capillary: 88 mg/dL (ref 65–99)
Glucose-Capillary: 67 mg/dL (ref 65–99)
Glucose-Capillary: 81 mg/dL (ref 65–99)

## 2017-05-06 MED ORDER — IOPAMIDOL (ISOVUE-300) INJECTION 61%: 15.0000 mL | INTRAVENOUS | Status: AC

## 2017-05-06 MED ORDER — IOPAMIDOL (ISOVUE-300) INJECTION 61%
75.0000 mL | Freq: Once | INTRAVENOUS | Status: AC | PRN
  Administered 2017-05-06: 75 mL via INTRAVENOUS

## 2017-05-06 NOTE — Evaluation (Signed)
Clinical/Bedside Swallow Evaluation Patient Details  Name: Hannah Vaughan MRN: [id number] Date of Birth: May 24, 1941  Today's Date: 05/06/2017 Time: SLP Start Time (ACUTE ONLY): 0915 SLP Stop Time (ACUTE ONLY): 0940 SLP Time Calculation (min) (ACUTE ONLY): 25 min  Past Medical History:  Past Medical History:  Diagnosis Date  . CAD (coronary artery disease)   . GERD (gastroesophageal reflux disease)   . History of hiatal hernia   . HTN (hypertension)   . Mental retardation   . Osteoarthritis   . Pneumococcus infection   . Seasonal allergies   . Seizure (HCC)   . Seizure [facility])    Past Surgical History:  Past Surgical History:  Procedure Laterality Date  . CHOLECYSTECTOMY    . LAPAROTOMY N/A 04/29/2017   Procedure: EXPLORATORY LAPAROTOMY, lysis of adhesions;  Surgeon: Henrene Dodge, MD;  Location: [facility] ORS;  Service: General;  Laterality: N/A;   HPI:  76 year old female admitted 04/29/17 with N/V, SBO. PMH significant for CAD, GERD, hiatal hernia, recurrent UTI, mental retardation, OA, seizures, dysphagia (on D1/NTL per BSE 04/01/17) CXR 04/30/17 = Mild atelectasis or infiltrate at the right upper lobe.    Assessment / Plan / Recommendation Clinical Impression  Oral care completed with suction. Pt presents with forward holding of tongue at all times. Pt is non verbal, but occasional vocalizations are elicited. Clear voice quality observed. Limited po presentations given during this assessment due to pending tests, however pt appears to tolerate puree and nectar thick consistencies without overt s/s aspiration or decline in respiratory status. Audible swallow noted, with adequate laryngeal elevation per palpation. No change in voice quality, no cough or throat clear noted following presentations.  Per order, there is concern that aspiration may have occurred. It is uncertain when this aspiration occurred, but most likely occurred during vomiting episodes. Once cleared for po intake by MD,  recommend resuming puree diet and nectar thick liquids with crushed meds. ST will continue to follow for assessment of diet tolerance and need for further workup. RN aware of recommendations, and safe swallow precautions were left in pt room to be posted when po diet is initiated.   SLP Visit Diagnosis: Dysphagia, oropharyngeal phase (R13.12)    Aspiration Risk  Mild aspiration risk    Diet Recommendation Dysphagia 1 (Puree);Nectar-thick liquid   Liquid Administration via: Spoon Medication Administration: Crushed with puree Supervision: Full supervision/cueing for compensatory strategies Compensations: Minimize environmental distractions;Slow rate;Small sips/bites Postural Changes: Remain upright for at least 30 minutes after po intake;Seated upright at 90 degrees    Other  Recommendations Oral Care Recommendations: Oral care before and after PO Other Recommendations: Have oral suction available   Follow up Recommendations 24 hour supervision/assistance      Frequency and Duration min 1 x/week  2 weeks       Prognosis Prognosis for Safe Diet Advancement: Fair Barriers to Reach Goals: Cognitive deficits;Time post onset      Swallow Study   General Date of Onset: 04/29/17 HPI: 76 year old female admitted 04/29/17 with N/V, SBO. PMH significant for CAD, GERD, hiatal hernia, recurrent UTI, mental retardation, OA, seizures, dysphagia (on D1/NTL per BSE 04/01/17) CXR 04/30/17 = Mild atelectasis or infiltrate at the right upper lobe.  Type of Study: Bedside Swallow Evaluation Previous Swallow Assessment: BSE 04/01/17 recommended Dys 1 (puree) and nectar thick liquids, crushed meds Diet Prior to this Study: NPO Temperature Spikes Noted: No Respiratory Status: Room air History of Recent Intubation: Yes Length of Intubations (days): 1 days  Date extubated: 04/30/17 Behavior/Cognition: Alert;Cooperative;Doesn't follow directions Oral Cavity Assessment: Within Functional Limits Oral Care  Completed by SLP: Yes Oral Cavity - Dentition: Edentulous Self-Feeding Abilities: Total assist Patient Positioning: Upright in bed Baseline Vocal Quality: Normal Volitional Cough: Cognitively unable to elicit Volitional Swallow: Unable to elicit    Oral/Motor/Sensory Function Overall Oral Motor/Sensory Function:  (unable to assess)   Ice Chips Ice chips: Not tested   Thin Liquid Thin Liquid: Not tested    Nectar Thick Nectar Thick Liquid: Within functional limits Presentation: Spoon   Honey Thick Honey Thick Liquid: Not tested   Puree Puree: Within functional limits Presentation: Spoon   Solid    Solid: Not tested       Naiara Lombardozzi B. Murvin Natal, [facility], CCC-SLP Speech Pathologist [phone]  Leigh Aurora 05/06/2017,9:48 AM

## 2017-05-06 NOTE — Progress Notes (Signed)
7 Days Post-Op   Subjective:  Patient is 7 days postop for exploratory laparotomy. Was noted to have nausea and vomiting overnight. Patient is completely nonverbal. Her abdomen is mildly distended this morning.  Vital signs in last 24 hours: Temp:  [97.5 F (36.4 C)-97.8 F (36.6 C)] 97.8 F (36.6 C) (08/20 0459) Pulse Rate:  [76-91] 88 (08/20 0459) Resp:  [17-20] 18 (08/20 0459) BP: (102-143)/(58-80) 102/58 (08/20 0459) SpO2:  [92 %-100 %] 92 % (08/20 0459) Last BM Date: 05/05/17  Intake/Output from previous day: 08/19 0701 - 08/20 0700 In: -  Out: 60 [Urine:60]  GI: Abdomen is soft but moderately distended. Staples in place to the midline incision over signs of erythema or drainage.  Lab Results:  CBC  Recent Labs  05/06/17 0400  WBC 11.2*  HGB 11.7*  HCT 37.2  PLT 295   CMP     Component Value Date/Time   NA 141 05/06/2017 0400   NA 141 07/13/2014 1021   K 3.9 05/06/2017 0400   K 3.6 07/13/2014 1021   CL 109 05/06/2017 0400   CL 102 07/13/2014 1021   CO2 26 05/06/2017 0400   CO2 30 07/13/2014 1021   GLUCOSE 101 (H) 05/06/2017 0400   GLUCOSE 127 (H) 07/13/2014 1021   BUN 19 05/06/2017 0400   BUN 23 (H) 07/13/2014 1021   CREATININE 0.62 05/06/2017 0400   CREATININE 1.17 07/13/2014 1021   CALCIUM 7.6 (L) 05/06/2017 0400   CALCIUM 8.2 (L) 07/13/2014 1021   PROT 6.9 04/29/2017 1843   PROT 7.3 01/23/2013 1745   ALBUMIN 2.2 (L) 05/04/2017 0502   ALBUMIN 2.9 (L) 01/23/2013 1745   AST 24 04/29/2017 1843   AST 16 01/23/2013 1745   ALT 25 04/29/2017 1843   ALT 13 01/23/2013 1745   ALKPHOS 141 (H) 04/29/2017 1843   ALKPHOS 224 (H) 01/23/2013 1745   BILITOT 0.9 04/29/2017 1843   BILITOT 0.2 01/23/2013 1745   GFRNONAA >60 05/06/2017 0400   GFRNONAA 48 (L) 07/13/2014 1021   GFRNONAA >60 07/30/2013 0829   GFRAA >60 05/06/2017 0400   GFRAA 59 (L) 07/13/2014 1021   GFRAA >60 07/30/2013 0829   PT/INR No results for input(s): LABPROT, INR in the last 72  hours.  Studies/Results: Dg Abd 1 View  Result Date: 05/06/2017 CLINICAL DATA:  Post- operative ileus EXAM: ABDOMEN - 1 VIEW COMPARISON:  Supine abdominal radiograph of May 05, 2017 FINDINGS: There remain loops of mildly distended gas-filled small bowel in the mid abdomen. The degree of distention has decreased slightly. There is some stool and gas in the right colon and in the rectum. No free extraluminal gas collections are observed. There is some gas within the stomach. The retrocardiac region on the left remains dense. Surgical skin staples are present in the midline in the lower abdomen and in the pelvis. IMPRESSION: Persistent small bowel ileus versus partial distal small bowel obstruction. Slight interval decrease in the degree of small-bowel distention. No free extraluminal gas is observed. Electronically Signed   By: David  [place] M.D.   On: 05/06/2017 07:11   Dg Abd 1 View  Result Date: 05/05/2017 CLINICAL DATA:  Ileus.  Follow-up. EXAM: ABDOMEN - 1 VIEW COMPARISON:  May 04, 2017 FINDINGS: Air-filled mildly dilated loops of small bowel are stable in the interval. There is a paucity of colonic gas. Distal small bowel loops appear more normal in caliber. Skin staples are identified. No other acute interval changes. IMPRESSION: Mild small bowel dilatation with  decompression of the colon is suggestive of an early or mild small bowel obstruction. No significant oval change. Electronically Signed   By: Gerome Sam III M.D   On: 05/05/2017 07:09    Assessment/Plan: 76 year old female 7 days status post export her laparotomy for bowel traction. Appears to have consistent versus recurrent ileus or small bowel obstruction. Given the findings on x-ray we will proceed with a repeat CT scan this morning to evaluate for intra-abdominal pathology such as abscess versus recurrent obstruction. Appreciate internal medicine and speech pathology assistance.   Ricarda Frame, MD [facility] General  Surgeon [facility]  Day [facility] [phone] Night [facility] [phone]  05/06/2017

## 2017-05-06 NOTE — Progress Notes (Signed)
Pt is coughing up boost and medications crushed in applesauce a few hours after administration. Placed in high fowlers. Surgical made aware. Pt made NPO at this time until further evaluation.

## 2017-05-06 NOTE — Progress Notes (Signed)
PT Cancellation Note  Patient Details Name: Hannah Vaughan MRN: [id number] DOB: 1941-03-05   Cancelled Treatment:    Reason Eval/Treat Not Completed: Fatigue/lethargy limiting ability to participate. Treatment attempted; pt soundly sleeping and could not be awoken through voice, touch, sternal rub. Family member notes she has been mostly like this today. Re attempt at a later date.    Scot Dock 05/06/2017, 4:07 PM

## 2017-05-06 NOTE — Progress Notes (Signed)
Patient ID: Hannah Vaughan, female   DOB: 11/18/1940, 76 y.o.   MRN: [id number]   Called by nursing regarding vomiting and concern for aspiration.  Patient made NPO, speech eval ordered.  Nurse to inform surgical team given post-op.  Monitor closely.

## 2017-05-06 NOTE — Progress Notes (Signed)
Patient has returned from CT.  Dr Tonita Cong gave orders for clear  Liquid diet, nectar thick.  Dr Allena Katz notified about bp of 109/61 and also notified that the patient has not voided since an in and out done at 0620.  Order received to hold cardiac/BP meds and in and out cath BID PRN

## 2017-05-06 NOTE — Progress Notes (Signed)
Patient sent to CT.

## 2017-05-07 LAB — CBC
HEMATOCRIT: 33.9 % — AB (ref 35.0–47.0)
Hemoglobin: 10.5 g/dL — ABNORMAL LOW (ref 12.0–16.0)
MCH: 24.3 pg — AB (ref 26.0–34.0)
MCHC: 30.8 g/dL — ABNORMAL LOW (ref 32.0–36.0)
MCV: 78.6 fL — AB (ref 80.0–100.0)
Platelets: 285 10*3/uL (ref 150–440)
RBC: 4.31 MIL/uL (ref 3.80–5.20)
RDW: 21.4 % — AB (ref 11.5–14.5)
WBC: 8.9 10*3/uL (ref 3.6–11.0)

## 2017-05-07 LAB — BASIC METABOLIC PANEL
Anion gap: 7 (ref 5–15)
BUN: 16 mg/dL (ref 6–20)
CHLORIDE: 108 mmol/L (ref 101–111)
CO2: 23 mmol/L (ref 22–32)
Calcium: 7.4 mg/dL — ABNORMAL LOW (ref 8.9–10.3)
Creatinine, Ser: 0.49 mg/dL (ref 0.44–1.00)
GFR calc Af Amer: >60 mL/min (ref >60)
GFR calc non Af Amer: >60 mL/min (ref >60)
Glucose, Bld: 94 mg/dL (ref 65–99)
POTASSIUM: 3.7 mmol/L (ref 3.5–5.1)
SODIUM: 138 mmol/L (ref 135–145)

## 2017-05-07 LAB — GLUCOSE, CAPILLARY
GLUCOSE-CAPILLARY: 114 mg/dL — AB (ref 65–99)
GLUCOSE-CAPILLARY: 140 mg/dL — AB (ref 65–99)
Glucose-Capillary: 86 mg/dL (ref 65–99)
Glucose-Capillary: 90 mg/dL (ref 65–99)
Glucose-Capillary: 93 mg/dL (ref 65–99)

## 2017-05-07 NOTE — Progress Notes (Signed)
8 Days Post-Op   Subjective:  Patient without acute events overnight. Tolerated her liquid diet. She appeared tired and slept through the majority of my encounter but woke up and converse with the nurse and medially after left the room.  Vital signs in last 24 hours: Temp:  [97.4 F (36.3 C)-98.2 F (36.8 C)] 98.2 F (36.8 C) (08/21 0431) Pulse Rate:  [72-93] 93 (08/21 0431) Resp:  [18] 18 (08/21 0431) BP: (105-142)/(52-67) 105/52 (08/21 0431) SpO2:  [91 %-99 %] 91 % (08/21 0431) Last BM Date: 05/05/17  Intake/Output from previous day: 08/20 0701 - 08/21 0700 In: 60 [P.O.:60] Out: 150 [Urine:150]  GI: Abdomen is soft, nontender, nondistended. Well approximated midline staples without evidence of erythema or drainage.  Lab Results:  CBC  Recent Labs  05/06/17 0400 05/07/17 0700  WBC 11.2* 8.9  HGB 11.7* 10.5*  HCT 37.2 33.9*  PLT 295 285   CMP     Component Value Date/Time   NA 138 05/07/2017 0700   NA 141 07/13/2014 1021   K 3.7 05/07/2017 0700   K 3.6 07/13/2014 1021   CL 108 05/07/2017 0700   CL 102 07/13/2014 1021   CO2 23 05/07/2017 0700   CO2 30 07/13/2014 1021   GLUCOSE 94 05/07/2017 0700   GLUCOSE 127 (H) 07/13/2014 1021   BUN 16 05/07/2017 0700   BUN 23 (H) 07/13/2014 1021   CREATININE 0.49 05/07/2017 0700   CREATININE 1.17 07/13/2014 1021   CALCIUM 7.4 (L) 05/07/2017 0700   CALCIUM 8.2 (L) 07/13/2014 1021   PROT 6.9 04/29/2017 1843   PROT 7.3 01/23/2013 1745   ALBUMIN 2.2 (L) 05/04/2017 0502   ALBUMIN 2.9 (L) 01/23/2013 1745   AST 24 04/29/2017 1843   AST 16 01/23/2013 1745   ALT 25 04/29/2017 1843   ALT 13 01/23/2013 1745   ALKPHOS 141 (H) 04/29/2017 1843   ALKPHOS 224 (H) 01/23/2013 1745   BILITOT 0.9 04/29/2017 1843   BILITOT 0.2 01/23/2013 1745   GFRNONAA >60 05/07/2017 0700   GFRNONAA 48 (L) 07/13/2014 1021   GFRNONAA >60 07/30/2013 0829   GFRAA >60 05/07/2017 0700   GFRAA 59 (L) 07/13/2014 1021   GFRAA >60 07/30/2013 0829    PT/INR No results for input(s): LABPROT, INR in the last 72 hours.  Studies/Results: Dg Abd 1 View  Result Date: 05/06/2017 CLINICAL DATA:  Post- operative ileus EXAM: ABDOMEN - 1 VIEW COMPARISON:  Supine abdominal radiograph of May 05, 2017 FINDINGS: There remain loops of mildly distended gas-filled small bowel in the mid abdomen. The degree of distention has decreased slightly. There is some stool and gas in the right colon and in the rectum. No free extraluminal gas collections are observed. There is some gas within the stomach. The retrocardiac region on the left remains dense. Surgical skin staples are present in the midline in the lower abdomen and in the pelvis. IMPRESSION: Persistent small bowel ileus versus partial distal small bowel obstruction. Slight interval decrease in the degree of small-bowel distention. No free extraluminal gas is observed. Electronically Signed   By: David  [place] M.D.   On: 05/06/2017 07:11   Ct Abdomen Pelvis W Contrast  Result Date: 05/06/2017 CLINICAL DATA:  76 year old female postop day 7 status post exploratory laparotomy with lysis of adhesions and treatment of closed loop small bowel obstruction. Nausea and vomiting over night with mild abdominal distention today. EXAM: CT ABDOMEN AND PELVIS WITH CONTRAST TECHNIQUE: Multidetector CT imaging of the abdomen and pelvis was  performed using the standard protocol following bolus administration of intravenous contrast. CONTRAST:  75mL ISOVUE-300 IOPAMIDOL (ISOVUE-300) INJECTION 61% COMPARISON:  Preoperative CT Abdomen and Pelvis 04/29/2017. FINDINGS: Lower chest: Increased layering bilateral pleural effusions since 04/29/2017, now moderate. Associated lower lobe compressive atelectasis. Stable cardiomegaly. Calcified left ventricular apex. No pericardial effusion. Hepatobiliary: Surgically absent gallbladder. Stable liver aside from increased adjacent free fluid and trace free air. Pancreas: Negative. Spleen:  Negative aside from increased adjacent free fluid. Adrenals/Urinary Tract: Normal adrenal glands. Bilateral renal enhancement and contrast excretion is within normal limits. Diminutive urinary bladder. Stomach/Bowel: Decompressed rectum. Redundant sigmoid colon containing some gas, but mostly decompressed. Small volume retained stool in the descending colon which is decompressed. Increased gas and stool in the transverse colon compared to the preoperative CT. Similar gas and low-density stool in the right colon. Right gonadal vein phlebolith. Appendix not delineated, either diminutive or absent. Decompressed distal small bowel loops. Larger proximal small bowel loops, up to 34 mm diameter. Oral contrast was administered but has not yet reached the mid small bowel. There is a gradual transition from air in fluid-filled proximal small bowel loops to the decompressed distal small bowel. Small volume free fluid in the small bowel mesentery, small jejunum. Small volume additional abdominal free fluid. Small volume pneumoperitoneum, most apparent along the anterior liver contour. Small gastric hiatal hernia. Stomach and duodenum are within normal limits. Vascular/Lymphatic: Extensive Calcified aortic atherosclerosis. There is atherosclerosis of the celiac and SMA. 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 Major arterial structures in the abdomen and pelvis are patent. Portal venous system is patent. No lymphadenopathy. Reproductive: Surgically absent. Other: Moderate volume pelvic free fluid with simple fluid density. This is in the area of the prior closed loop obstruction, but does not appear organized or rim enhancing. Musculoskeletal: Lower thoracic spine ankylosis. L5-S1 spondylolysis and mild grade 1 spondylolisthesis. No acute osseous abnormality identified. New generalized subcutaneous edema throughout the lower chest, abdomen, and pelvis. IMPRESSION: 1. Mildly distended proximal small bowel  loops with a gradual transition to decompressed distal small bowel. This appearance favors ileus over mechanical small bowel obstruction. 2. New anasarca, which might explain nonspecific increased free fluid in the abdomen and pelvis. A small volume of pneumoperitoneum likely is postoperative. 3. Increased moderate bilateral pleural effusions with lower lobe compressive atelectasis. 4. Extensive Calcified aortic atherosclerosis, with atherosclerosis of aortic branches including the SMA. Major arterial structures appear patent. 5. Cardiomegaly with left ventricle myocardial calcification. Electronically Signed   By: Odessa Fleming M.D.   On: 05/06/2017 13:03    Assessment/Plan: 76 year old female 8 days status post export her laparotomy for bowel obstruction.. Doing much better today. Plan to advance her diet and attempts to have increased physical therapy today. Discussed with caregiver in the room the possibility of discharge home tomorrow if she continues to do well today.   Ricarda Frame, MD [facility] General Surgeon [facility]  Day [facility] [phone] Night [facility] [phone]  05/07/2017

## 2017-05-07 NOTE — Progress Notes (Signed)
Nutrition Follow-up  DOCUMENTATION CODES:   Not applicable  INTERVENTION:  Recommend discontinuing Boost Breeze as it is not nectar-thick.  Recommend Magic cup TID with meals, each supplement provides 290 kcal and 9 grams of protein.  Recommend Mighty Shake II BID with lunch and dinner, each supplement provides 480-500 kcals and 20-23 grams of protein.  NUTRITION DIAGNOSIS:   Swallowing difficulty related to dysphagia as evidenced by other (see comment) (baseling diet of dysphagia 1 with nectar-thick liquids).  Ongoing.  Inadequate oral intake related to inability to eat as evidenced by NPO status.   Resolved.  GOAL:   Patient will meet greater than or equal to 90% of their needs  Progressing.  MONITOR:   PO intake, Supplement acceptance, Labs, Weight trends, I & O's  REASON FOR ASSESSMENT:   Ventilator    ASSESSMENT:   76 year old female with PMHx of mental retardation who is nonverbal at baseline, HTN, seizure, CAD, OA, GERD, hx of hiatal hernia who presented [name] with persistent nausea and vomiting found to have closed loop obstruction of small bowel due to adhesions. Patient now s/p exploratory laparotomy with lysis of adhesions on 8/14.  -SLP evaluated patient on 8/20 and recommends dysphagia 1 diet with nectar-thick liquids (once diet able to be advanced per surgery). -Patient was advanced to clear liquid nectar-thick liquids on 8/17. She was NPO after midnight on 8/20 and then returned to clear liquid nectar-thick liquids that afternoon. Today patient was advanced to full liquid nectar-thick liquids.  Patient sleeping at time of assessment. Her caregiver and sister are not at bedside today. Discussed with RN. Patient did well with breakfast today. She had a bowel movement yesterday. May discharge tomorrow if she continues to do well.  Meal completion not recorded in chart. Patient's breakfast tray at bedside at time of assessment. She had  finished 75% of grits, her nectar-thick apple juice, and 100% of Magic Cup from tray.  Medications reviewed and include: Augmentin, pantoprazole.  Labs reviewed: CBG 67-93 past 24 hrs.  I/O: 150 ml UOP past 24 hrs + 3 occurrences unmeasured UOP  No subsequent weight from 8/14 to trend.  Diet Order:  Diet full liquid Room service appropriate? Yes; Fluid consistency: Nectar Thick  Skin:  Wound (see comment) (closed incision abdomen)  Last BM:  05/05/2017 - type 2  Height:   Ht Readings from Last 1 Encounters:  04/30/17 5' (1.524 m)    Weight:   Wt Readings from Last 1 Encounters:  04/30/17 114 lb 3.2 oz (51.8 kg)    Ideal Body Weight:  45.5 kg  BMI:  Body mass index is 22.3 kg/m.  Estimated Nutritional Needs:   Kcal:  1230-1415 (MSJ x 1.3-1.5)  Protein:  67-78 grams (1.3-1.5 grams/kg)  Fluid:  1.3-1.5 L/day (25-30 ml/kg)  EDUCATION NEEDS:   No education needs identified at this time  Helane Rima, MS, RD, LDN Pager: [phone] After Hours Pager: [phone]

## 2017-05-07 NOTE — Progress Notes (Signed)
PT Cancellation Note  Patient Details Name: Hannah Vaughan MRN: [id number] DOB: [birth year]   Cancelled Treatment:    Reason Eval/Treat Not Completed: Fatigue/lethargy limiting ability to participate. Treatment attempted; pt obtunded with mitts on hands. Unable to awaken for PT. Re attempt tomorrow.    Scot Dock, PTA 05/07/2017, 5:18 PM

## 2017-05-07 NOTE — Progress Notes (Signed)
SLP Cancellation Note  Patient Details Name: MORGAINE KISSOON MRN: [id number] DOB: February 20, 1941   Cancelled treatment:       Reason Eval/Treat Not Completed: Patient unavailable - nursing care. Consulted with RN who reports pt eating well, tolerating meds po. Lungs clear/diminished, no temp elevations. ST will continue to follow to assess diet tolerance.  Archana Eckman B. Murvin Natal [facility], CCC-SLP Speech Pathologist [phone]  Leigh Aurora 05/07/2017, 2:50 PM

## 2017-05-08 LAB — GLUCOSE, CAPILLARY
GLUCOSE-CAPILLARY: 93 mg/dL (ref 65–99)
Glucose-Capillary: 98 mg/dL (ref 65–99)
Glucose-Capillary: 112 mg/dL — ABNORMAL HIGH (ref 65–99)

## 2017-05-08 LAB — BASIC METABOLIC PANEL
Anion gap: 4 — ABNORMAL LOW (ref 5–15)
BUN: 15 mg/dL (ref 6–20)
CO2: 27 mmol/L (ref 22–32)
Calcium: 7.3 mg/dL — ABNORMAL LOW (ref 8.9–10.3)
Chloride: 108 mmol/L (ref 101–111)
Creatinine, Ser: 0.53 mg/dL (ref 0.44–1.00)
GFR calc Af Amer: >60 mL/min (ref >60)
GFR calc non Af Amer: >60 mL/min (ref >60)
Glucose, Bld: 111 mg/dL — ABNORMAL HIGH (ref 65–99)
Potassium: 3.6 mmol/L (ref 3.5–5.1)
Sodium: 139 mmol/L (ref 135–145)

## 2017-05-08 NOTE — Progress Notes (Signed)
Attempt to walk pt with caregivers at bedside unsuccessful.    Caregivers stated they would return tomorrow to help re-assess pt ambulatory status.  They stated they will bring walker and shoes.

## 2017-05-08 NOTE — Progress Notes (Signed)
9 Days Post-Op   Subjective:  No acute events noted overnight. Patient tolerated diet yesterday. She is nonverbal during my visit.  Vital signs in last 24 hours: Temp:  [96.2 F (35.7 C)-98.6 F (37 C)] 98.6 F (37 C) (08/22 0444) Pulse Rate:  [68-78] 78 (08/22 0444) Resp:  [18-22] 22 (08/22 0444) BP: (93-125)/(46-58) 125/50 (08/22 0444) SpO2:  [94 %-100 %] 96 % (08/22 0444) Last BM Date: 05/05/17  Intake/Output from previous day: 08/21 0701 - 08/22 0700 In: 470 [P.O.:470] Out: 0   GI: Soft, nondistended, nontender. Well approximated staples to the midline without evidence of erythema or drainage.  Lab Results:  CBC  Recent Labs  05/06/17 0400 05/07/17 0700  WBC 11.2* 8.9  HGB 11.7* 10.5*  HCT 37.2 33.9*  PLT 295 285   CMP     Component Value Date/Time   NA 139 05/08/2017 0533   NA 141 07/13/2014 1021   K 3.6 05/08/2017 0533   K 3.6 07/13/2014 1021   CL 108 05/08/2017 0533   CL 102 07/13/2014 1021   CO2 27 05/08/2017 0533   CO2 30 07/13/2014 1021   GLUCOSE 111 (H) 05/08/2017 0533   GLUCOSE 127 (H) 07/13/2014 1021   BUN 15 05/08/2017 0533   BUN 23 (H) 07/13/2014 1021   CREATININE 0.53 05/08/2017 0533   CREATININE 1.17 07/13/2014 1021   CALCIUM 7.3 (L) 05/08/2017 0533   CALCIUM 8.2 (L) 07/13/2014 1021   PROT 6.9 04/29/2017 1843   PROT 7.3 01/23/2013 1745   ALBUMIN 2.2 (L) 05/04/2017 0502   ALBUMIN 2.9 (L) 01/23/2013 1745   AST 24 04/29/2017 1843   AST 16 01/23/2013 1745   ALT 25 04/29/2017 1843   ALT 13 01/23/2013 1745   ALKPHOS 141 (H) 04/29/2017 1843   ALKPHOS 224 (H) 01/23/2013 1745   BILITOT 0.9 04/29/2017 1843   BILITOT 0.2 01/23/2013 1745   GFRNONAA >60 05/08/2017 0533   GFRNONAA 48 (L) 07/13/2014 1021   GFRNONAA >60 07/30/2013 0829   GFRAA >60 05/08/2017 0533   GFRAA 59 (L) 07/13/2014 1021   GFRAA >60 07/30/2013 0829   PT/INR No results for input(s): LABPROT, INR in the last 72 hours.  Studies/Results: Ct Abdomen Pelvis W  Contrast  Result Date: 05/06/2017 CLINICAL DATA:  76 year old female postop day 7 status post exploratory laparotomy with lysis of adhesions and treatment of closed loop small bowel obstruction. Nausea and vomiting over night with mild abdominal distention today. EXAM: CT ABDOMEN AND PELVIS WITH CONTRAST TECHNIQUE: Multidetector CT imaging of the abdomen and pelvis was performed using the standard protocol following bolus administration of intravenous contrast. CONTRAST:  48mL ISOVUE-300 IOPAMIDOL (ISOVUE-300) INJECTION 61% COMPARISON:  Preoperative CT Abdomen and Pelvis 04/29/2017. FINDINGS: Lower chest: Increased layering bilateral pleural effusions since 04/29/2017, now moderate. Associated lower lobe compressive atelectasis. Stable cardiomegaly. Calcified left ventricular apex. No pericardial effusion. Hepatobiliary: Surgically absent gallbladder. Stable liver aside from increased adjacent free fluid and trace free air. Pancreas: Negative. Spleen: Negative aside from increased adjacent free fluid. Adrenals/Urinary Tract: Normal adrenal glands. Bilateral renal enhancement and contrast excretion is within normal limits. Diminutive urinary bladder. Stomach/Bowel: Decompressed rectum. Redundant sigmoid colon containing some gas, but mostly decompressed. Small volume retained stool in the descending colon which is decompressed. Increased gas and stool in the transverse colon compared to the preoperative CT. Similar gas and low-density stool in the right colon. Right gonadal vein phlebolith. Appendix not delineated, either diminutive or absent. Decompressed distal small bowel loops. Larger proximal small  bowel loops, up to 34 mm diameter. Oral contrast was administered but has not yet reached the mid small bowel. There is a gradual transition from air in fluid-filled proximal small bowel loops to the decompressed distal small bowel. Small volume free fluid in the small bowel mesentery, small jejunum. Small volume  additional abdominal free fluid. Small volume pneumoperitoneum, most apparent along the anterior liver contour. Small gastric hiatal hernia. Stomach and duodenum are within normal limits. Vascular/Lymphatic: Extensive Calcified aortic atherosclerosis. There is atherosclerosis of the celiac and SMA. 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 Major arterial structures in the abdomen and pelvis are patent. Portal venous system is patent. No lymphadenopathy. Reproductive: Surgically absent. Other: Moderate volume pelvic free fluid with simple fluid density. This is in the area of the prior closed loop obstruction, but does not appear organized or rim enhancing. Musculoskeletal: Lower thoracic spine ankylosis. L5-S1 spondylolysis and mild grade 1 spondylolisthesis. No acute osseous abnormality identified. New generalized subcutaneous edema throughout the lower chest, abdomen, and pelvis. IMPRESSION: 1. Mildly distended proximal small bowel loops with a gradual transition to decompressed distal small bowel. This appearance favors ileus over mechanical small bowel obstruction. 2. New anasarca, which might explain nonspecific increased free fluid in the abdomen and pelvis. A small volume of pneumoperitoneum likely is postoperative. 3. Increased moderate bilateral pleural effusions with lower lobe compressive atelectasis. 4. Extensive Calcified aortic atherosclerosis, with atherosclerosis of aortic branches including the SMA. Major arterial structures appear patent. 5. Cardiomegaly with left ventricle myocardial calcification. Electronically Signed   By: Odessa Fleming M.D.   On: 05/06/2017 13:03    Assessment/Plan: 76 year old female with a small bowel traction. Recovering well from her surgery. Tolerated diet well. Patient on need to work with physical therapy and be evaluated with social work for possibility of going back to her group home. Probably ready for discharge home later today.   Ricarda Frame,  MD [facility] General Surgeon [facility]  Day [facility] [phone] Night [facility] [phone]  05/08/2017

## 2017-05-08 NOTE — Progress Notes (Signed)
Physical Therapy Treatment Patient Details Name: Hannah Vaughan MRN: [id number] DOB: March 01, 1941 Today's Date: 05/08/2017    History of Present Illness Hannah Vaughan is a 76 y.o. female who presents with 2 day history of nausea and vomiting. Her history is complicated by history of mental retardation patient is not able to express appropriately her symptoms. Her caregiver gives her history. She has had episodes of vomiting over the last 2 days as well as complaints of abdominal pain going from her back radiating towards the front and lower abdomen. She was noticed to be more lethargic with decreased oxygen saturation and with a low-grade temperature less than 100 and was brought to the hospital for further evaluation. It was unclear although suspected that she has not had a bowel movement in multipole days. It was also unclear how much urine she had been voiding.She does have a recent history of severe UTI and has been admitted multiple times with this. She did have an admission in March 2018 for a suspected NSTEMI by cardiology believed this was more atypical chest pain rather than a true MI. No further interventions were needed and she has followed up with cardiology since then. In the emergency room, she was noted to have an elevated white blood cell count of 20.1, an elevated creatinine of 1.1 relative to baseline of 0.68 last month and a mildly elevated troponin of 0.09 although no evidence of ischemia on EKG. She also has a UTI with many bacteria and white blood cells noted on her urinalysis. She did have a CT scan which was concerning for a closed loop obstruction of small bowel in the pelvis with associated free fluid. She is now s/p exploratory laparotomy with lysis of adhesis. Pt also with aspiration PNA.     PT Comments    Pt does not respond with any form of communication during session; flat affect. Pt does not or is not able to follow any exercise instructions and does not attempt movements  with assist. Pt does show some attempted assist with supine to sit at edge of bed, but limited requiring Max A for bed mobility. Pt requires Mod A to maintain sitting at edge of bed with posterior and either lateral lean. Pt looks to be quite fatigued post sitting. Pt is not at baseline and may need skilled nursing facility if group home is unable to accept pt at less than baseline state. Continue PT to progress participation, strength, endurance to improve functional mobility towards baseline.    Follow Up Recommendations  Home health PT;Other (comment) (Pt less than baseline; if group home can't take needs SNF)     Equipment Recommendations       Recommendations for Other Services       Precautions / Restrictions Precautions Precautions: Fall Restrictions Weight Bearing Restrictions: No    Mobility  Bed Mobility Overal bed mobility: Needs Assistance Bed Mobility: Supine to Sit;Sit to Supine     Supine to sit: Max assist Sit to supine: Max assist   General bed mobility comments: Initial attempt, but quite limited. Requires Mod A to maintain at edge fo bed with posterior and either laterl lean  Transfers                 General transfer comment: Unsafe to attempt  Ambulation/Gait                 Stairs            Wheelchair Mobility  Modified Rankin (Stroke Patients Only)       Balance Overall balance assessment: Needs assistance Sitting-balance support: No upper extremity supported;Feet unsupported;Single extremity supported Sitting balance-Leahy Scale: Poor Sitting balance - Comments: posterior and either lateral lean Postural control: Posterior lean;Right lateral lean;Left lateral lean                                  Cognition Arousal/Alertness: Awake/alert Behavior During Therapy: Flat affect Overall Cognitive Status: History of cognitive impairments - at baseline Area of Impairment: Following commands                        Following Commands: Follows one step commands inconsistently       General Comments: follows little commands or unable to perform instructed activity. Does attempt to assist in sit to edge of bed initially, but very limited.       Exercises Other Exercises Other Exercises: Attempted instruction for several exercises; pt does not attempt any movement at ankle, hip or knee or QS isometrics with cues and assist    General Comments        Pertinent Vitals/Pain Pain Assessment:  (does not verbalize; no grimaces or facial expression of pain)    Home Living                      Prior Function            PT Goals (current goals can now be found in the care plan section) Progress towards PT goals: Not progressing toward goals - comment    Frequency    Min 2X/week      PT Plan Current plan remains appropriate    Co-evaluation              AM-PAC PT "6 Clicks" Daily Activity  Outcome Measure  Difficulty turning over in bed (including adjusting bedclothes, sheets and blankets)?: Unable Difficulty moving from lying on back to sitting on the side of the bed? : Unable Difficulty sitting down on and standing up from a chair with arms (e.g., wheelchair, bedside commode, etc,.)?: Unable Help needed moving to and from a bed to chair (including a wheelchair)?: Total Help needed walking in hospital room?: Total Help needed climbing 3-5 steps with a railing? : Total 6 Click Score: 6    End of Session   Activity Tolerance: Patient limited by fatigue;Other (comment) (weakness and/or cognition) Patient left: in bed;with call bell/phone within reach;with bed alarm set   PT Visit Diagnosis: Unsteadiness on feet (R26.81);Muscle weakness (generalized) (M62.81);Difficulty in walking, not elsewhere classified (R26.2)     Time: 1856-3149 PT Time Calculation (min) (ACUTE ONLY): 20 min  Charges:  $Therapeutic Activity: 8-22 mins                    G CodesScot Dock, PTA 05/08/2017, 12:15 PM

## 2017-05-09 LAB — GLUCOSE, CAPILLARY
Glucose-Capillary: 81 mg/dL (ref 65–99)
Glucose-Capillary: 98 mg/dL (ref 65–99)

## 2017-05-09 NOTE — Progress Notes (Signed)
Patient discharged to group home as ordered. IV discontinued and discharge packet given to group home tech at the bedside. Patient is alert and no acute distress noted. Patient ambulated 100 feet with walker, no acute distress

## 2017-05-09 NOTE — Discharge Summary (Signed)
Patient ID: Hannah Vaughan MRN: [id number] DOB/AGE: 06/05/1941 76 y.o.  Admit date: 04/29/2017 Discharge date: 05/09/2017  Discharge Diagnoses:  Small bowel obstruction  Procedures Performed: Exploratory Laparotomy  Discharged Condition: good  Hospital Course: Patient taken to OR for laparotomy secondary to bowel obstruction. Tolerated procedure well. Had prolonged hospital stay secondary to post operative ileus. On day of discharge she was tolerating a diet, her home medications, was ambulating and otherwise doing well. Her abdomen was soft and non-tender. Staples were in place without evidence of infection or drainage.  Discharge Orders: Discharge Instructions    Call MD for:  persistant nausea and vomiting    Complete by:  As directed    Call MD for:  redness, tenderness, or signs of infection (pain, swelling, redness, odor or green/yellow discharge around incision site)    Complete by:  As directed    Call MD for:  severe uncontrolled pain    Complete by:  As directed    Call MD for:  temperature >100.4    Complete by:  As directed    Diet - low sodium heart healthy    Complete by:  As directed    Discharge instructions    Complete by:  As directed    Keep staples covered until removed.   Increase activity slowly    Complete by:  As directed       Disposition: 03-Skilled Nursing Facility  Discharge Medications: Allergies as of 05/09/2017      Reactions   Depakote [valproic Acid]    Divalproex Sodium Other (See Comments)      Medication List    TAKE these medications   acetaminophen 325 MG tablet Commonly known as:  TYLENOL Take 1 tablet by mouth every 6 (six) hours.   aspirin EC 81 MG tablet Take 1 tablet by mouth daily.   bacitracin-polymyxin b ointment Commonly known as:  POLYSPORIN Apply 1 application topically as needed.   CALCIUM 500/D 500-200 MG-UNIT tablet Generic drug:  calcium-vitamin D Take 1 tablet by mouth 2 (two) times daily.   carbamide  peroxide 6.5 % OTIC solution Commonly known as:  DEBROX Place 5 drops into both ears every 14 (fourteen) days.   cetirizine 10 MG tablet Commonly known as:  ZYRTEC Take 1 tablet by mouth daily as needed.   cyanocobalamin 1000 MCG/ML injection Commonly known as:  (VITAMIN B-12) Inject 1 mL into the muscle every 30 (thirty) days.   eucerin cream Apply 1 application topically 2 (two) times daily.   gabapentin 600 MG tablet Commonly known as:  NEURONTIN Take 1 tablet by mouth 4 (four) times daily.   guaiFENesin 100 MG/5ML Soln Commonly known as:  ROBITUSSIN Take 5 mLs by mouth every 4 (four) hours as needed for cough or to loosen phlegm.   hydrochlorothiazide 25 MG tablet Commonly known as:  HYDRODIURIL Take 1 tablet by mouth daily.   hydrocortisone 25 MG suppository Commonly known as:  ANUSOL-HC Place 25 mg rectally 2 (two) times daily as needed for hemorrhoids or itching.   hydrocortisone-pramoxine rectal foam Commonly known as:  PROCTOFOAM-HC Place 1 applicator rectally 2 (two) times daily.   iron polysaccharides 150 MG capsule Commonly known as:  NIFEREX Take 150 mg by mouth daily.   isosorbide mononitrate 60 MG 24 hr tablet Commonly known as:  IMDUR Take 1 tablet by mouth daily.   LORazepam 0.5 MG tablet Commonly known as:  ATIVAN Take 0.5 mg by mouth every 8 (eight) hours.   magnesium hydroxide  400 MG/5ML suspension Commonly known as:  MILK OF MAGNESIA Take 5 mLs by mouth as needed.   MALDROXAL ANTACID PO Take by mouth as needed.   metoprolol succinate 25 MG 24 hr tablet Commonly known as:  TOPROL-XL Take 1 tablet by mouth daily.   miconazole 2 % powder Commonly known as:  MICOTIN Apply 1 application topically 2 (two) times daily.   mupirocin ointment 2 % Commonly known as:  BACTROBAN Place 1 application into the nose 3 (three) times daily.   nitroGLYCERIN 0.4 MG SL tablet Commonly known as:  NITROSTAT Place 1 tablet under the tongue as needed.    nystatin powder Generic drug:  nystatin Apply 1 g topically as needed.   omeprazole 20 MG capsule Commonly known as:  PRILOSEC Take 1 capsule by mouth daily.   ondansetron 4 MG disintegrating tablet Commonly known as:  ZOFRAN ODT Take 1 tablet (4 mg total) by mouth every 8 (eight) hours as needed for nausea or vomiting.   PHENObarbital 20 MG/5ML elixir Take 15 mLs by mouth at bedtime.   phenytoin 100 MG ER capsule Commonly known as:  DILANTIN Take 100 mg by mouth 3 (three) times daily.   STOOL SOFTENER 100 MG capsule Generic drug:  docusate sodium Take 1 capsule by mouth 2 (two) times daily.   terbinafine 1 % cream Commonly known as:  LAMISIL Apply 1 application topically 2 (two) times daily.   tolnaftate 1 % spray Commonly known as:  TINACTIN Apply 1 application topically 2 (two) times daily.   traMADol 50 MG tablet Commonly known as:  ULTRAM Take 1 tablet (50 mg total) by mouth every 6 (six) hours as needed.            Discharge Care Instructions        Start     Ordered   05/09/17 0000  Increase activity slowly     05/09/17 1445   05/09/17 0000  Diet - low sodium heart healthy     05/09/17 1445   05/09/17 0000  Call MD for:  temperature >100.4     05/09/17 1445   05/09/17 0000  Call MD for:  persistant nausea and vomiting     05/09/17 1445   05/09/17 0000  Call MD for:  severe uncontrolled pain     05/09/17 1445   05/09/17 0000  Call MD for:  redness, tenderness, or signs of infection (pain, swelling, redness, odor or green/yellow discharge around incision site)     05/09/17 1445   05/09/17 0000  Discharge instructions    Comments:  Keep staples covered until removed.   05/09/17 1445       Follwup: Follow-up Information    Ricarda Frame, MD. Go in 1 week(s).   Specialty:  General Surgery Why:  Report to clinic at 2:15 for a 2:30 appointment on 8/30 Contact information: [address] [place]  [address] [phone]           Signed: Ricarda Frame 05/09/2017, 2:48 PM

## 2017-05-09 NOTE — NC FL2 (Signed)
[place] LEVEL OF CARE SCREENING TOOL     IDENTIFICATION  Patient Name: Hannah Vaughan Birthdate: March 06, 1941 Sex: female Admission Date (Current Location): 04/29/2017  [facility] and [place] Number:  Engineering geologist and Address:  [facility], [address] [place], [place] [address]      Provider Number: [id number]  Attending Physician Name and Address:  Olean Ree, MD  Relative Name and Phone Number:       Current Level of Care: Hospital Recommended Level of Care:  (group home) Prior Approval Number:    Date Approved/Denied:   PASRR Number:    Discharge Plan:  (group home)    Current Diagnoses: Patient Active Problem List   Diagnosis Date Noted  . SBO (small bowel obstruction) ([place]) 04/29/2017  . Small bowel obstruction ([place]) 04/29/2017  . Lower urinary tract infectious disease 03/31/2017  . NSTEMI (non-ST elevated myocardial infarction) ([place]) 11/26/2016  . Acute respiratory failure with hypoxemia ([place]) 05/06/2015  . HTN (hypertension) 05/06/2015  . Seizure disorder ([place]) 05/06/2015  . CAD (coronary artery disease) 05/06/2015  . Osteoarthritis 05/06/2015  . GERD (gastroesophageal reflux disease) 05/06/2015  . HCAP (healthcare-associated pneumonia) 05/06/2015  . Abnormal ECG 06/24/2014  . 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 Dependent edema 06/06/2014    Orientation RESPIRATION BLADDER Height & Weight     Self  Normal Continent Weight: 114 lb 3.2 oz (51.8 kg) Height:  5' (152.4 cm)  BEHAVIORAL SYMPTOMS/MOOD NEUROLOGICAL BOWEL NUTRITION STATUS   (none) Convulsions/Seizures Continent Diet (puree diet)  AMBULATORY STATUS COMMUNICATION OF NEEDS Skin   Extensive Assist   Normal                       Personal Care Assistance Level of Assistance  Bathing, Dressing, Feeding Bathing Assistance: Limited assistance Feeding assistance: Limited assistance Dressing Assistance: Limited assistance     Functional Limitations  Info             SPECIAL CARE FACTORS FREQUENCY   Cover staples and follow up in office for removal.                    Contractures Contractures Info: Not present    Additional Factors Info                  Current Medications (05/09/2017):  This is the current hospital active medication list Current Facility-Administered Medications  Medication Dose Route Frequency Provider Last Rate Last Dose  . amoxicillin-clavulanate (AUGMENTIN) 875-125 MG per tablet 1 tablet  1 tablet Oral Q12H Fritzi Mandes, MD   1 tablet at 05/09/17 1204  . aspirin EC tablet 81 mg  81 mg Oral Daily Fritzi Mandes, MD   81 mg at 05/09/17 1203  . chlorhexidine gluconate (MEDLINE KIT) (PERIDEX) 0.12 % solution 15 mL  15 mL Mouth Rinse BID Awilda Bill, NP   15 mL at 05/08/17 2000  . enoxaparin (LOVENOX) injection 40 mg  40 mg Subcutaneous Q24H Henreitta Leber, MD   40 mg at 05/08/17 2222  . gabapentin (NEURONTIN) tablet 600 mg  600 mg Oral QID Fritzi Mandes, MD   600 mg at 05/09/17 1203  . hydrALAZINE (APRESOLINE) injection 10 mg  10 mg Intravenous Q6H PRN Piscoya, Jose, MD      . hydrochlorothiazide (HYDRODIURIL) tablet 25 mg  25 mg Oral Daily Henreitta Leber, MD   25 mg at 05/09/17 1203  . ipratropium-albuterol (DUONEB) 0.5-2.5 (3) MG/3ML  nebulizer solution 3 mL  3 mL Nebulization Q6H PRN Awilda Bill, NP      . iron polysaccharides (NIFEREX) capsule 150 mg  150 mg Oral Daily Fritzi Mandes, MD   150 mg at 05/09/17 1205  . isosorbide mononitrate (IMDUR) 24 hr tablet 60 mg  60 mg Oral Daily Henreitta Leber, MD   60 mg at 05/09/17 1204  . metoprolol succinate (TOPROL-XL) 24 hr tablet 25 mg  25 mg Oral Daily Henreitta Leber, MD   25 mg at 05/09/17 1204  . ondansetron (ZOFRAN-ODT) disintegrating tablet 4 mg  4 mg Oral Q6H PRN Piscoya, Jose, MD       Or  . ondansetron (ZOFRAN) injection 4 mg  4 mg Intravenous Q6H PRN Olean Ree, MD   4 mg at 04/29/17 2132  . ondansetron (ZOFRAN) injection 4 mg  4 mg  Intravenous Once PRN Molli Barrows, MD      . pantoprazole (PROTONIX) EC tablet 40 mg  40 mg Oral Daily Fritzi Mandes, MD   40 mg at 05/09/17 1203  . PHENObarbital 20 MG/5ML elixir 60 mg  60 mg Oral QHS Fritzi Mandes, MD   60 mg at 05/08/17 2223  . phenytoin (DILANTIN) ER capsule 100 mg  100 mg Oral TID Fritzi Mandes, MD   100 mg at 05/09/17 3979  . traMADol (ULTRAM) tablet 50 mg  50 mg Oral Q6H PRN Fritzi Mandes, MD         Discharge Medications: Please see discharge summary for a list of discharge medications.  Relevant Imaging Results:  Relevant Lab Results:   Additional Information    Shela Leff, LCSW

## 2017-05-09 NOTE — Discharge Instructions (Signed)
Exploratory Laparotomy, Adult, Care After °Refer to this sheet in the next few weeks. These instructions provide you with information about caring for yourself after your procedure. Your health care provider may also give you more specific instructions. Your treatment has been planned according to current medical practices, but problems sometimes occur. Call your health care provider if you have any problems or questions after your procedure. °What can I expect after the procedure? °After your procedure, it is typical to have: °· Abdominal soreness. °· Fatigue. °· A sore throat from tubes in your throat. °· A lack of appetite. ° °Follow these instructions at home: °Medicines °· Take medicines only as directed by your health care provider. °· Do not drive or operate heavy machinery while taking pain medicine. °Incision care °· There are many different ways to close and cover an incision, including stitches (sutures), skin glue, and adhesive strips. Follow your health care provider's instructions about: °? Incision care. °? Bandage (dressing) changes and removal. °? Incision closure removal. °· Do not take showers or baths until your health care provider says that you can. °· Check your incision area daily for signs of infection. Watch for: °? Redness. °? Tenderness. °? Swelling. °? Drainage. °Activity °· Do not lift anything that is heavier than 10 pounds (4.5 kg) until your health care provider says that it is safe. °· Try to walk a little bit each day if your health care provider says that it is okay. °· Ask your health care provider when you can start to do your usual activities again, such as driving, going back to work, and having sex. °Eating and drinking °· You may eat what you usually eat. Include lots of whole grains, fruits, and vegetables in your diet. This will help to prevent constipation. °· Drink enough fluid to keep your urine clear or pale yellow. °General instructions °· Keep all follow-up visits as  directed by your health care provider. This is important. °Contact a health care provider if: °· You have a fever. °· You have chills. °· Your pain medicine is not helping. °· You have constipation or diarrhea. °· You have nausea or vomiting. °· You have drainage, redness, swelling, or pain at your incision site. °Get help right away if: °· Your pain is getting worse. °· It has been more than 3 days since you been able to have a bowel movement. °· You have ongoing (persistent) vomiting. °· The edges of your incision open up. °· You have warmth, tenderness, and swelling in your calf. °· You have trouble breathing. °· You have chest pain. °This information is not intended to replace advice given to you by your health care provider. Make sure you discuss any questions you have with your health care provider. °Document Released: 04/17/2004 Document Revised: 02/09/2016 Document Reviewed: 04/21/2014 °Elsevier Interactive Patient Education © 2018 Elsevier Inc[employer] ° °

## 2017-05-09 NOTE — Clinical Social Work Note (Signed)
Patient was able to ambulate with her nurse today. Sherry at [employer] is aware. FL2 faxed to main office and Cordelia Pen is aware. [name] employees to transport.  York Spaniel MSW,LCSW [phone]

## 2017-05-09 NOTE — Progress Notes (Signed)
Physical Therapy Treatment Patient Details Name: Hannah Vaughan MRN: [id number] DOB: 04/15/1941 Today's Date: 05/09/2017    History of Present Illness Hannah Vaughan is a 76 y.o. female who presents with 2 day history of nausea and vomiting. Her history is complicated by history of mental retardation patient is not able to express appropriately her symptoms. Her caregiver gives her history. She has had episodes of vomiting over the last 2 days as well as complaints of abdominal pain going from her back radiating towards the front and lower abdomen. She was noticed to be more lethargic with decreased oxygen saturation and with a low-grade temperature less than 100 and was brought to the hospital for further evaluation. It was unclear although suspected that she has not had a bowel movement in multipole days. It was also unclear how much urine she had been voiding.She does have a recent history of severe UTI and has been admitted multiple times with this. She did have an admission in March 2018 for a suspected NSTEMI by cardiology believed this was more atypical chest pain rather than a true MI. No further interventions were needed and she has followed up with cardiology since then. In the emergency room, she was noted to have an elevated white blood cell count of 20.1, an elevated creatinine of 1.1 relative to baseline of 0.68 last month and a mildly elevated troponin of 0.09 although no evidence of ischemia on EKG. She also has a UTI with many bacteria and white blood cells noted on her urinalysis. She did have a CT scan which was concerning for a closed loop obstruction of small bowel in the pelvis with associated free fluid. She is now s/p exploratory laparotomy with lysis of adhesis. Pt also with aspiration PNA.     PT Comments    Pt in bed with sisters in room.  Participated in exercises as described below.  Caregivers have not brought in shoes or walker.  Sister stated they would be here after lunch.   Participated in exercises as described below. Mobility deferred until caregivers arrive and will re-schedule with another therapist after lunch if possible for mobility.    Follow Up Recommendations  Home health PT;Other (comment)     Equipment Recommendations  None recommended by PT    Recommendations for Other Services       Precautions / Restrictions Precautions Precautions: Fall Restrictions Weight Bearing Restrictions: No    Mobility  Bed Mobility                  Transfers                 General transfer comment: deferred- group home to bring in shoes this pm  Ambulation/Gait                 Stairs            Wheelchair Mobility    Modified Rankin (Stroke Patients Only)       Balance                                            Cognition Arousal/Alertness: Awake/alert Behavior During Therapy: Flat affect Overall Cognitive Status: History of cognitive impairments - at baseline Area of Impairment: Following commands  Following Commands: Follows one step commands inconsistently              Exercises Other Exercises Other Exercises: PROM BLE 2 x 10 for ankle pumps, heel slides, ab/add, SLR and SAQ - does not assist.    General Comments        Pertinent Vitals/Pain Pain Assessment: No/denies pain Pain Location: Pt unable to verbalize any pain with questioning    Home Living                      Prior Function            PT Goals (current goals can now be found in the care plan section)      Frequency    Min 2X/week      PT Plan Current plan remains appropriate    Co-evaluation              AM-PAC PT "6 Clicks" Daily Activity  Outcome Measure  Difficulty turning over in bed (including adjusting bedclothes, sheets and blankets)?: Unable Difficulty moving from lying on back to sitting on the side of the bed? : Unable Difficulty sitting  down on and standing up from a chair with arms (e.g., wheelchair, bedside commode, etc,.)?: Unable Help needed moving to and from a bed to chair (including a wheelchair)?: Total Help needed walking in hospital room?: Total Help needed climbing 3-5 steps with a railing? : Total 6 Click Score: 6    End of Session Equipment Utilized During Treatment: Gait belt   Patient left: in bed;with call bell/phone within reach;with bed alarm set;with family/visitor present         Time: 1003-1013 PT Time Calculation (min) (ACUTE ONLY): 10 min  Charges:  $Therapeutic Exercise: 8-22 mins                    G Codes:       Danielle Dess, PTA 05/09/17, 10:39 AM

## 2017-05-10 ENCOUNTER — Telehealth: Payer: Self-pay

## 2017-05-10 NOTE — Telephone Encounter (Signed)
Tried to contact patient twice for a post op call. Was unable to leave a message at this time.

## 2017-05-14 ENCOUNTER — Other Ambulatory Visit: Payer: Self-pay

## 2017-05-15 LAB — MISC LABCORP TEST (SEND OUT): Labcorp test code: 182808

## 2017-05-16 ENCOUNTER — Encounter: Payer: Self-pay | Admitting: Surgery

## 2017-05-16 ENCOUNTER — Ambulatory Visit (INDEPENDENT_AMBULATORY_CARE_PROVIDER_SITE_OTHER): Payer: Medicare Other | Admitting: Surgery

## 2017-05-16 VITALS — BP 138/75 | HR 74 | Ht 60.0 in | Wt 122.0 lb

## 2017-05-16 DIAGNOSIS — K56609 Unspecified intestinal obstruction, unspecified as to partial versus complete obstruction: Secondary | ICD-10-CM

## 2017-05-16 NOTE — Progress Notes (Signed)
Outpatient postop visit  05/16/2017  Hannah GroutDoris E Strebel is an 76 y.o. female.    Procedure: Exploratory laparotomy and lysis of adhesions  CC: No problems  HPI: This patient with a history of a small bowel obstruction closed loop obstruction who underwent an exploratory laparotomy and lysis of adhesions. No bowel resection was necessary. Caregivers report that she is eating well and having bowel movements. Patient is wheelchair bound and mentally challenged Medications reviewed.    Physical Exam:  There were no vitals taken for this visit.    PE: Wound is clean no erythema no drainage staples remove Steri-Strips are placed with benzoin.    Assessment/Plan:  Patient doing very well may return to normal activities. Follow-up as needed  Lattie Hawichard E Mandeep Kiser, MD, FACS

## 2017-05-16 NOTE — Patient Instructions (Addendum)
GENERAL POST-OPERATIVE PATIENT INSTRUCTIONS   WOUND CARE INSTRUCTIONS:  We have removed your staples today and placed steri strips. These steri strips will fall off on their own.  Keep a dry clean dressing on the wound if there is drainage. The initial bandage may be removed after 24 hours.  Once the wound has quit draining you may leave it open to air.  If clothing rubs against the wound or causes irritation and the wound is not draining you may cover it with a dry dressing during the daytime.  Try to keep the wound dry and avoid ointments on the wound unless directed to do so.  If the wound becomes bright red and painful or starts to drain infected material that is not clear, please contact your physician immediately.  If the wound is mildly pink and has a thick firm ridge underneath it, this is normal, and is referred to as a healing ridge.  This will resolve over the next 4-6 weeks.   BATHING: You may shower if you have been informed of this by your surgeon. However, Please do not submerge in a tub, hot tub, or pool until incisions are completely sealed or have been told by your surgeon that you may do so.  DIET:  You may eat any foods that you can tolerate.  It is a good idea to eat a high fiber diet and take in plenty of fluids to prevent constipation.  If you do become constipated you may want to take a mild laxative or take ducolax tablets on a daily basis until your bowel habits are regular.  Constipation can be very uncomfortable, along with straining, after recent surgery.  ACTIVITY:  You are encouraged to cough and deep breath or use your incentive spirometer if you were given one, every 15-30 minutes when awake.  This will help prevent respiratory complications and low grade fevers post-operatively if you had a general anesthetic.  You may want to hug a pillow when coughing and sneezing to add additional support to the surgical area, if you had abdominal or chest surgery, which will decrease  pain during these times.  You are encouraged to walk and engage in light activity for the next two weeks.  You should not lift more than 20 pounds, until 05/10/2017 as it could put you at increased risk for complications.  Twenty pounds is roughly equivalent to a plastic bag of groceries. At that time- Listen to your body when lifting, if you have pain when lifting, stop and then try again in a few days. Soreness after doing exercises or activities of daily living is normal as you get back in to your normal routine.  MEDICATIONS:  Try to take narcotic medications and anti-inflammatory medications, such as tylenol, ibuprofen, naprosyn, etc., with food.  This will minimize stomach upset from the medication.  Should you develop nausea and vomiting from the pain medication, or develop a rash, please discontinue the medication and contact your physician.  You should not drive, make important decisions, or operate machinery when taking narcotic pain medication.  SUNBLOCK Use sun block to incision area over the next year if this area will be exposed to sun. This helps decrease scarring and will allow you avoid a permanent darkened area over your incision.  QUESTIONS:  Please feel free to call our office if you have any questions, and we will be glad to assist you. [phone]

## 2017-05-17 LAB — URINE CULTURE

## 2017-07-02 ENCOUNTER — Emergency Department: Payer: Medicare Other

## 2017-07-02 ENCOUNTER — Inpatient Hospital Stay
Admission: EM | Admit: 2017-07-02 | Discharge: 2017-07-18 | DRG: 871 | Disposition: E | Payer: Medicare Other | Attending: Internal Medicine | Admitting: Internal Medicine

## 2017-07-02 ENCOUNTER — Encounter: Payer: Self-pay | Admitting: Emergency Medicine

## 2017-07-02 DIAGNOSIS — Z79899 Other long term (current) drug therapy: Secondary | ICD-10-CM

## 2017-07-02 DIAGNOSIS — A419 Sepsis, unspecified organism: Secondary | ICD-10-CM | POA: Diagnosis present

## 2017-07-02 DIAGNOSIS — R000 Tachycardia, unspecified: Secondary | ICD-10-CM | POA: Diagnosis present

## 2017-07-02 DIAGNOSIS — Z823 Family history of stroke: Secondary | ICD-10-CM | POA: Diagnosis not present

## 2017-07-02 DIAGNOSIS — R0902 Hypoxemia: Secondary | ICD-10-CM | POA: Diagnosis present

## 2017-07-02 DIAGNOSIS — Z888 Allergy status to other drugs, medicaments and biological substances status: Secondary | ICD-10-CM

## 2017-07-02 DIAGNOSIS — Z8249 Family history of ischemic heart disease and other diseases of the circulatory system: Secondary | ICD-10-CM

## 2017-07-02 DIAGNOSIS — I119 Hypertensive heart disease without heart failure: Secondary | ICD-10-CM | POA: Diagnosis present

## 2017-07-02 DIAGNOSIS — G40909 Epilepsy, unspecified, not intractable, without status epilepticus: Secondary | ICD-10-CM | POA: Diagnosis present

## 2017-07-02 DIAGNOSIS — J9601 Acute respiratory failure with hypoxia: Secondary | ICD-10-CM | POA: Diagnosis present

## 2017-07-02 DIAGNOSIS — Z833 Family history of diabetes mellitus: Secondary | ICD-10-CM

## 2017-07-02 DIAGNOSIS — I252 Old myocardial infarction: Secondary | ICD-10-CM

## 2017-07-02 DIAGNOSIS — J90 Pleural effusion, not elsewhere classified: Secondary | ICD-10-CM | POA: Diagnosis present

## 2017-07-02 DIAGNOSIS — K219 Gastro-esophageal reflux disease without esophagitis: Secondary | ICD-10-CM | POA: Diagnosis present

## 2017-07-02 DIAGNOSIS — E872 Acidosis: Secondary | ICD-10-CM | POA: Diagnosis present

## 2017-07-02 DIAGNOSIS — Z7982 Long term (current) use of aspirin: Secondary | ICD-10-CM | POA: Diagnosis not present

## 2017-07-02 DIAGNOSIS — I469 Cardiac arrest, cause unspecified: Secondary | ICD-10-CM | POA: Diagnosis present

## 2017-07-02 DIAGNOSIS — Z9889 Other specified postprocedural states: Secondary | ICD-10-CM

## 2017-07-02 DIAGNOSIS — F79 Unspecified intellectual disabilities: Secondary | ICD-10-CM | POA: Diagnosis present

## 2017-07-02 DIAGNOSIS — Z9049 Acquired absence of other specified parts of digestive tract: Secondary | ICD-10-CM | POA: Diagnosis not present

## 2017-07-02 DIAGNOSIS — J189 Pneumonia, unspecified organism: Secondary | ICD-10-CM

## 2017-07-02 DIAGNOSIS — I2510 Atherosclerotic heart disease of native coronary artery without angina pectoris: Secondary | ICD-10-CM | POA: Diagnosis present

## 2017-07-02 DIAGNOSIS — E860 Dehydration: Secondary | ICD-10-CM | POA: Diagnosis present

## 2017-07-02 DIAGNOSIS — J690 Pneumonitis due to inhalation of food and vomit: Secondary | ICD-10-CM | POA: Diagnosis present

## 2017-07-02 DIAGNOSIS — J181 Lobar pneumonia, unspecified organism: Secondary | ICD-10-CM

## 2017-07-02 LAB — CBC
HCT: 33.4 % — ABNORMAL LOW (ref 35.0–47.0)
Hemoglobin: 10.3 g/dL — ABNORMAL LOW (ref 12.0–16.0)
MCH: 22.1 pg — ABNORMAL LOW (ref 26.0–34.0)
MCHC: 30.9 g/dL — AB (ref 32.0–36.0)
MCV: 71.3 fL — ABNORMAL LOW (ref 80.0–100.0)
Platelets: 316 10*3/uL (ref 150–440)
RBC: 4.68 MIL/uL (ref 3.80–5.20)
RDW: 20.8 % — AB (ref 11.5–14.5)
WBC: 22.1 10*3/uL — ABNORMAL HIGH (ref 3.6–11.0)

## 2017-07-02 LAB — BASIC METABOLIC PANEL
Anion gap: 12 (ref 5–15)
BUN: 27 mg/dL — AB (ref 6–20)
CHLORIDE: 98 mmol/L — AB (ref 101–111)
CO2: 26 mmol/L (ref 22–32)
CREATININE: 0.91 mg/dL (ref 0.44–1.00)
Calcium: 9.1 mg/dL (ref 8.9–10.3)
GFR calc Af Amer: >60 mL/min (ref >60)
GLUCOSE: 126 mg/dL — AB (ref 65–99)
POTASSIUM: 3.9 mmol/L (ref 3.5–5.1)
Sodium: 136 mmol/L (ref 135–145)

## 2017-07-02 LAB — LACTIC ACID, PLASMA: Lactic Acid, Venous: 3.2 mmol/L (ref 0.5–1.9)

## 2017-07-02 LAB — TROPONIN I: Troponin I: 0.03 ng/mL (ref <0.03)

## 2017-07-02 MED ORDER — SODIUM CHLORIDE 0.9 % IV BOLUS (SEPSIS)
250.0000 mL | Freq: Once | INTRAVENOUS | Status: AC
  Administered 2017-07-02: 250 mL via INTRAVENOUS

## 2017-07-02 MED ORDER — DEXTROSE 5 % IV SOLN
500.0000 mg | INTRAVENOUS | Status: DC
  Administered 2017-07-03 – 2017-07-04 (×2): 500 mg via INTRAVENOUS
  Filled 2017-07-02 (×2): qty 500

## 2017-07-02 MED ORDER — SODIUM CHLORIDE 0.9 % IV BOLUS (SEPSIS)
500.0000 mL | Freq: Once | INTRAVENOUS | Status: AC
  Administered 2017-07-02: 500 mL via INTRAVENOUS

## 2017-07-02 MED ORDER — CEFTRIAXONE SODIUM IN DEXTROSE 20 MG/ML IV SOLN
1.0000 g | Freq: Once | INTRAVENOUS | Status: AC
  Administered 2017-07-02: 1 g via INTRAVENOUS
  Filled 2017-07-02: qty 50

## 2017-07-02 MED ORDER — DEXTROSE 5 % IV SOLN
500.0000 mg | Freq: Once | INTRAVENOUS | Status: AC
  Administered 2017-07-02: 500 mg via INTRAVENOUS
  Filled 2017-07-02: qty 500

## 2017-07-02 MED ORDER — SODIUM CHLORIDE 0.9 % IV BOLUS (SEPSIS)
1000.0000 mL | Freq: Once | INTRAVENOUS | Status: AC
  Administered 2017-07-02: 1000 mL via INTRAVENOUS

## 2017-07-02 MED ORDER — CEFTRIAXONE SODIUM 1 G IJ SOLR
1.0000 g | INTRAMUSCULAR | Status: DC
  Filled 2017-07-02: qty 10

## 2017-07-02 NOTE — Progress Notes (Signed)
Pharmacy Antibiotic Note  Hannah Vaughan is a 76 y.o. female admitted on 06/23/2017 with pneumonia.  Pharmacy has been consulted for azithromycin and ceftriaxone dosing.  Plan: Azithromycin 500 mg q 24 hours and ceftriaxone 1 gram q 24 hours ordered.  Height:  (144.8 cm) Weight: 115 lb (52.2 kg) IBW/kg (Calculated) : 38.6  Temp (24hrs), Avg:99.4 F (37.4 C), Min:99.4 F (37.4 C), Max:99.4 F (37.4 C)   Recent Labs Lab 07/17/2017 2045  WBC 22.1*  CREATININE 0.91    Estimated Creatinine Clearance: 37.7 mL/min (by C-G formula based on SCr of 0.91 mg/dL).    Allergies  Allergen Reactions  . Depakote [Valproic Acid]   . Divalproex Sodium Other (See Comments)    Antimicrobials this admission: azithromycin ceftriaxone 10/16 >>    >>   Dose adjustments this admission:   Microbiology results: 10/16 BCx: pending 8/14 MRSA PCR: (-)      10/16 CXR: left lower consolidation, atelectasis vs. pneumonia  Thank you for allowing pharmacy to be a part of this patient's care.  Ediberto Sens S 06/17/2017 11:29 PM

## 2017-07-02 NOTE — ED Provider Notes (Signed)
[facility] Emergency Department Provider Note  Time seen: 9:07 PM  I have reviewed the triage vital signs and the nursing notes.   HISTORY  Chief Complaint Shortness of Breath    HPI Hannah Vaughan is a 76 y.o. female With a past medical history of CAD, gastric reflux, hypertension, mental retardation, seizure disorder, presents to the emergency department for fever and wheeze. According to the patient's caregiver with whom she lives at a group home, the patient was picked up from her adult daycare today and was noted to be somewhat short of breath with a wheeze. They state otherwise the patient is acting fairly at her baseline slightly more somnolent. She denies any chest pain. Caregiver did not check her temperature but does not believe she had a fever.  Past Medical History:  Diagnosis Date  . CAD (coronary artery disease)   . GERD (gastroesophageal reflux disease)   . History of hiatal hernia   . HTN (hypertension)   . Mental retardation   . Osteoarthritis   . Pneumococcus infection   . Seasonal allergies   . Seizure (HCC)   . Seizure [facility])     Patient Active Problem List   Diagnosis Date Noted  . SBO (small bowel obstruction) (HCC) 04/29/2017  . Small bowel obstruction (HCC) 04/29/2017  . Lower urinary tract infectious disease 03/31/2017  . Anemia, iron deficiency 12/12/2016  . NSTEMI (non-ST elevated myocardial infarction) (HCC) 11/26/2016  . MR (mental retardation) 10/31/2016  . Acute respiratory failure with hypoxemia (HCC) 05/06/2015  . HTN (hypertension) 05/06/2015  . Seizure disorder (HCC) 05/06/2015  . CAD (coronary artery disease) 05/06/2015  . Osteoarthritis 05/06/2015  . GERD (gastroesophageal reflux disease) 05/06/2015  . HCAP (healthcare-associated pneumonia) 05/06/2015  . Abnormal ECG 06/24/2014  . 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 Dependent edema 06/06/2014  . HLD (hyperlipidemia) 06/06/2014    Past Surgical History:  Procedure  Laterality Date  . CHOLECYSTECTOMY    . LAPAROTOMY N/A 04/29/2017   Procedure: EXPLORATORY LAPAROTOMY, lysis of adhesions;  Surgeon: Henrene Dodge, MD;  Location: [facility] ORS;  Service: General;  Laterality: N/A;    Prior to Admission medications   Medication Sig Start Date End Date Taking? Authorizing Provider  acetaminophen (TYLENOL) 325 MG tablet Take 1 tablet by mouth every 6 (six) hours.    [provider]  Aluminum & Magnesium Hydroxide (MALDROXAL ANTACID PO) Take by mouth as needed.    [provider]  aspirin EC 81 MG tablet Take 1 tablet by mouth daily. 11/11/14   [provider]  bacitracin-polymyxin b (POLYSPORIN) ointment Apply 1 application topically as needed.    [provider]  bacitracin-polymyxin b (POLYSPORIN) ointment Apply 1 application topically daily.    [provider]  calcium-vitamin D (OSCAL WITH D) 500-200 MG-UNIT TABS tablet Take 1 tablet by mouth 1 day or 1 dose. 02/25/17   [provider]  carbamide peroxide (EAR DROPS) 6.5 % OTIC solution Place 1 drop in ear(s) daily.    [provider]  cetirizine (ZYRTEC) 10 MG tablet Take 1 tablet by mouth daily as needed. 03/25/14   [provider]  cyanocobalamin (,VITAMIN B-12,) 1000 MCG/ML injection Inject 1 mL into the muscle every 30 (thirty) days.    [provider]  docusate sodium (STOOL SOFTENER) 100 MG capsule Take 1 capsule by mouth 2 (two) times daily.    [provider]  gabapentin (NEURONTIN) 600 MG tablet Take 1 tablet by mouth 4 (four) times daily.  06/11/14   [provider]  guaifenesin (COUGH SYRUP) 100 MG/5ML syrup Take 5 mLs by mouth as needed.    [provider]  hydrochlorothiazide (HYDRODIURIL) 25 MG tablet Take 1 tablet by mouth daily. 02/25/17   [provider]  hydrocortisone (ANUSOL-HC) 25 MG suppository Place 25 mg rectally 2 (two) times daily as needed for hemorrhoids or itching.    [provider]  hydrocortisone-pramoxine [facility]) rectal foam Place 1 applicator rectally 2 (two) times daily.    [provider]  iron polysaccharides (NIFEREX) 150 MG capsule Take 150 mg by mouth daily. 12/12/16 12/12/17  [provider]  isosorbide mononitrate (IMDUR) 60 MG 24 hr tablet Take 1 tablet by mouth daily. 02/11/14   [provider]  LORazepam (ATIVAN) 0.5 MG tablet Take 0.5 mg by mouth every 8 (eight) hours.    [provider]  magnesium hydroxide (MILK OF MAGNESIA) 400 MG/5ML suspension Take 5 mLs by mouth as needed.    [provider]  metoprolol succinate (TOPROL-XL) 25 MG 24 hr tablet Take 1 tablet by mouth daily.    [provider]  mupirocin ointment (BACTROBAN) 2 % Place 1 application into the nose 3 (three) times daily.    [provider]  nitroGLYCERIN (NITROSTAT) 0.4 MG SL tablet Place 1 tablet under the tongue as needed. 04/06/15   [provider]  nystatin (MYCOSTATIN/NYSTOP) powder Apply 1 application topically daily.    [provider]  nystatin (NYSTATIN) powder Apply 1 g topically as needed.    [provider]  omeprazole (PRILOSEC) 20 MG capsule Take 1 capsule by mouth daily. 01/06/15   [provider]  ondansetron (ZOFRAN-ODT) 4 MG disintegrating tablet Take 1 tablet by mouth as needed. 03/31/17   [provider]  PHENObarbital 20 MG/5ML elixir Take 15 mLs by mouth at bedtime.    [provider]  phenytoin (DILANTIN) 100 MG ER capsule Take 100 mg by mouth 3 (three) times daily.    [provider]  Skin Protectants, Misc. (EUCERIN) cream Apply 1 application topically 2 (two) times daily.     [provider]  terbinafine (LAMISIL) 1 % cream Apply 1 application topically daily.    [provider]  tolnaftate (TINACTIN) 1 % spray Apply 1 application topically 2 (two) times daily.    [provider]  traMADol (ULTRAM) 50  MG tablet Take 1 tablet (50 mg total) by mouth every 6 (six) hours as needed. 05/09/15   Enid Baas, MD    Allergies  Allergen Reactions  . Depakote [Valproic Acid]   . Divalproex Sodium Other (See Comments)    Family History  Problem Relation Age of Onset  . CAD Mother   . Congestive Heart Failure Mother   . CVA Father   . Heart attack Brother   . Hypertension Sister   . Diabetes Sister     Social History Social History  Substance Use Topics  . Smoking status: Never Smoker  . Smokeless tobacco: Never Used  . Alcohol use No    Review of Systems Constitutional: Negative for fever.low-grade fever in the emergency department. Cardiovascular: Negative for chest pain. Respiratory: positive for wheeze, patient denies shortness of breath but she is satting 89% on room air. Gastrointestinal: Negative for abdominal pain, vomiting and diarrhea. All other ROS negative, although somewhat limited due to the patient's baseline mental retardation.  ____________________________________________   PHYSICAL EXAM:  VITAL SIGNS: ED Triage Vitals  Enc Vitals Group  BP 2017/07/15 2006 135/90     Pulse Rate 07/15/17 2006 77     Resp 07/15/17 2006 (!) 22     Temp 07/15/2017 2006 99.4 F (37.4 C)     Temp Source 07/15/17 2006 Oral     SpO2 15-Jul-2017 2006 92 %     Weight 07-15-2017 2007 115 lb (52.2 kg)     Height Jul 15, 2017 2007  (1.448 m)     Head Circumference --      Peak Flow --      Pain Score --      Pain Loc --      Pain Edu? --      Excl. in GC? --     Constitutional: Alert and oriented. Well appearing and in no distress. Eyes: Normal exam ENT   Head: Normocephalic and atraumatic.   Mouth/Throat: Mucous membranes are moist. Cardiovascular: Normal rate, regular rhythm. No murmur Respiratory: Normal respiratory effort without tachypnea nor retractions. Breath sounds are clear  Gastrointestinal: Soft and nontender. No distention.   Musculoskeletal: Nontender  with normal range of motion in all extremities.  Neurologic:  Normal speech and language. No gross focal neurologic deficits  Skin:  Skin is warm, dry and intact.  Psychiatric: Mood and affect are normal.  ____________________________________________    EKG  EKG reviewed and interpreted by myself shows atrial fibrillation at 81 bpm, slightly widened QRS, normal axis, normal intervals, nonspecific ST changes but no ST elevation. Occasional PVC.  ____________________________________________    RADIOLOGY  IMPRESSION: 1. Cardiomegaly with vascular congestion and mild interstitial edema 2. Bilateral pleural effusions, small on the right and moderate to large on the left. 3. Dense left lower lung zone airspace consolidation may reflect atelectasis or pneumonia  ____________________________________________   INITIAL IMPRESSION / ASSESSMENT AND PLAN / ED COURSE  Pertinent labs & imaging results that were available during my care of the patient were reviewed by me and considered in my medical decision making (see chart for details).  patient presents to the emergency department for wheeze, shortness of breath found to be hypoxic to 89% in the emergency department. Patient is a low-grade temperature of 99.4 in the emergency department. On exam the patient does have a slight expiratory wheeze, right greater than left. We will check labs, chest x-ray, urinalysis and continue to close monitor in the emergency department. As the patient has a room air saturation in the upper 80s and no home O2 requirement I anticipate likely admission to the hospital. Differential at this time would include pneumonia, upper respiratory infection, bronchitis, other infectious etiology such as urinary tract infection with increased somnolence.    I reviewed the patient's records including her recent discharge summary 05/09/17 effort and admission for a bowel obstruction status post laparotomy. Patient does not appear  to have any abdominal tenderness on exam today.  patient's labs are resulted with a significant leukocytosis of 22,000 which appears to be new. Labs are otherwise largely nonrevealing. Chest x-ray is concerning for possible left-sided pneumonia.  given the patient's hypoxia with possible pneumonia and elevated white blood cell count I have activated sepsis protocols we will start on broad spectrum antibiotics and admitted to the hospital for further treatment. Patient agreeable to plan.  ____________________________________________   FINAL CLINICAL IMPRESSION(S) / ED DIAGNOSES  Wheeze SOB Hypoxia pneumonia   Minna Antis, MD 07/15/2017 2248

## 2017-07-02 NOTE — ED Triage Notes (Signed)
Patient brought in by care giver. Care giver reports that patient has been wheezing today with any exertion. Patient lung sounds clear at this time. Patient with swelling to right lower leg.

## 2017-07-03 ENCOUNTER — Inpatient Hospital Stay: Payer: Medicare Other

## 2017-07-03 ENCOUNTER — Inpatient Hospital Stay: Admit: 2017-07-03 | Payer: Medicare Other

## 2017-07-03 LAB — CBC
HEMATOCRIT: 31 % — AB (ref 35.0–47.0)
HEMOGLOBIN: 9.5 g/dL — AB (ref 12.0–16.0)
MCH: 22 pg — ABNORMAL LOW (ref 26.0–34.0)
MCHC: 30.6 g/dL — ABNORMAL LOW (ref 32.0–36.0)
MCV: 71.9 fL — AB (ref 80.0–100.0)
Platelets: 266 10*3/uL (ref 150–440)
RBC: 4.31 MIL/uL (ref 3.80–5.20)
RDW: 21 % — ABNORMAL HIGH (ref 11.5–14.5)
WBC: 23 10*3/uL — AB (ref 3.6–11.0)

## 2017-07-03 LAB — BODY FLUID CELL COUNT WITH DIFFERENTIAL
Eos, Fluid: 0 %
LYMPHS FL: 1 %
MONOCYTE-MACROPHAGE-SEROUS FLUID: 20 %
NEUTROPHIL FLUID: 79 %
Other Cells, Fluid: 0 %
Total Nucleated Cell Count, Fluid: 7577 cu mm

## 2017-07-03 LAB — BASIC METABOLIC PANEL
Anion gap: 8 (ref 5–15)
BUN: 29 mg/dL — AB (ref 6–20)
CO2: 26 mmol/L (ref 22–32)
Calcium: 8.5 mg/dL — ABNORMAL LOW (ref 8.9–10.3)
Chloride: 102 mmol/L (ref 101–111)
Creatinine, Ser: 0.83 mg/dL (ref 0.44–1.00)
GFR calc non Af Amer: >60 mL/min (ref >60)
Glucose, Bld: 160 mg/dL — ABNORMAL HIGH (ref 65–99)
POTASSIUM: 3.7 mmol/L (ref 3.5–5.1)
SODIUM: 136 mmol/L (ref 135–145)

## 2017-07-03 LAB — LACTATE DEHYDROGENASE, PLEURAL OR PERITONEAL FLUID: LD FL: 113 U/L — AB (ref 3–23)

## 2017-07-03 LAB — APTT: APTT: 36 s (ref 24–36)

## 2017-07-03 LAB — LACTIC ACID, PLASMA: LACTIC ACID, VENOUS: 2.7 mmol/L — AB (ref 0.5–1.9)

## 2017-07-03 LAB — BRAIN NATRIURETIC PEPTIDE: B NATRIURETIC PEPTIDE 5: 1907 pg/mL — AB (ref 0.0–100.0)

## 2017-07-03 LAB — PROTEIN, PLEURAL OR PERITONEAL FLUID: Total protein, fluid: 3 g/dL

## 2017-07-03 LAB — GLUCOSE, PLEURAL OR PERITONEAL FLUID: Glucose, Fluid: 132 mg/dL

## 2017-07-03 LAB — PROTIME-INR
INR: 1.34
Prothrombin Time: 16.5 seconds — ABNORMAL HIGH (ref 11.4–15.2)

## 2017-07-03 LAB — MRSA PCR SCREENING: MRSA BY PCR: NEGATIVE

## 2017-07-03 LAB — PROCALCITONIN: Procalcitonin: 0.19 ng/mL

## 2017-07-03 MED ORDER — ACETAMINOPHEN 650 MG RE SUPP: 650.0000 mg | Freq: Four times a day (QID) | RECTAL | Status: DC | PRN

## 2017-07-03 MED ORDER — NYSTATIN 100000 UNIT/GM EX POWD
Freq: Two times a day (BID) | CUTANEOUS | Status: DC
  Administered 2017-07-03 – 2017-07-04 (×2): via TOPICAL
  Filled 2017-07-03 (×3): qty 15

## 2017-07-03 MED ORDER — POLYSACCHARIDE IRON COMPLEX 150 MG PO CAPS
150.0000 mg | ORAL_CAPSULE | Freq: Every day | ORAL | Status: DC
  Administered 2017-07-03 – 2017-07-04 (×2): 150 mg via ORAL
  Filled 2017-07-03 (×3): qty 1

## 2017-07-03 MED ORDER — ASPIRIN EC 81 MG PO TBEC
81.0000 mg | DELAYED_RELEASE_TABLET | Freq: Every day | ORAL | Status: DC
  Administered 2017-07-03 – 2017-07-04 (×2): 81 mg via ORAL
  Filled 2017-07-03 (×2): qty 1

## 2017-07-03 MED ORDER — PIPERACILLIN-TAZOBACTAM 3.375 G IVPB
3.3750 g | Freq: Three times a day (TID) | INTRAVENOUS | Status: DC
  Administered 2017-07-03 – 2017-07-04 (×3): 3.375 g via INTRAVENOUS
  Filled 2017-07-03 (×3): qty 50

## 2017-07-03 MED ORDER — HYDROCODONE-ACETAMINOPHEN 5-325 MG PO TABS: 1.0000 | ORAL_TABLET | ORAL | Status: DC | PRN

## 2017-07-03 MED ORDER — ALBUTEROL SULFATE (2.5 MG/3ML) 0.083% IN NEBU: 2.5000 mg | INHALATION_SOLUTION | RESPIRATORY_TRACT | Status: DC | PRN

## 2017-07-03 MED ORDER — ISOSORBIDE MONONITRATE ER 30 MG PO TB24
60.0000 mg | ORAL_TABLET | Freq: Every day | ORAL | Status: DC
  Administered 2017-07-03 – 2017-07-04 (×2): 60 mg via ORAL
  Filled 2017-07-03 (×3): qty 2

## 2017-07-03 MED ORDER — LOPERAMIDE HCL 2 MG PO CAPS: 2.0000 mg | ORAL_CAPSULE | ORAL | Status: DC | PRN

## 2017-07-03 MED ORDER — PHENYTOIN SODIUM EXTENDED 100 MG PO CAPS
100.0000 mg | ORAL_CAPSULE | Freq: Three times a day (TID) | ORAL | Status: DC
  Administered 2017-07-03 – 2017-07-04 (×5): 100 mg via ORAL
  Filled 2017-07-03 (×7): qty 1

## 2017-07-03 MED ORDER — PHENOBARBITAL 20 MG/5ML PO ELIX
60.0000 mg | ORAL_SOLUTION | Freq: Every day | ORAL | Status: DC
  Administered 2017-07-03 (×2): 60 mg via ORAL
  Filled 2017-07-03 (×2): qty 15

## 2017-07-03 MED ORDER — BUDESONIDE 0.5 MG/2ML IN SUSP
0.5000 mg | Freq: Two times a day (BID) | RESPIRATORY_TRACT | Status: DC
  Administered 2017-07-03 – 2017-07-04 (×2): 0.5 mg via RESPIRATORY_TRACT
  Filled 2017-07-03 (×2): qty 2

## 2017-07-03 MED ORDER — TRAMADOL HCL 50 MG PO TABS: 50.0000 mg | ORAL_TABLET | Freq: Four times a day (QID) | ORAL | Status: DC | PRN

## 2017-07-03 MED ORDER — GABAPENTIN 300 MG PO CAPS
600.0000 mg | ORAL_CAPSULE | Freq: Four times a day (QID) | ORAL | Status: DC
  Administered 2017-07-03 – 2017-07-04 (×7): 600 mg via ORAL
  Filled 2017-07-03 (×7): qty 2

## 2017-07-03 MED ORDER — SENNOSIDES-DOCUSATE SODIUM 8.6-50 MG PO TABS: 1.0000 | ORAL_TABLET | Freq: Every evening | ORAL | Status: DC | PRN

## 2017-07-03 MED ORDER — SODIUM CHLORIDE 0.9 % IV SOLN
INTRAVENOUS | Status: DC
  Administered 2017-07-03: 14:00:00 via INTRAVENOUS

## 2017-07-03 MED ORDER — HYDROCORTISONE ACE-PRAMOXINE 1-1 % RE FOAM
1.0000 | Freq: Two times a day (BID) | RECTAL | Status: DC
  Filled 2017-07-03 (×2): qty 10

## 2017-07-03 MED ORDER — GUAIFENESIN 100 MG/5ML PO SYRP
5.0000 mL | ORAL_SOLUTION | Freq: Four times a day (QID) | ORAL | Status: DC | PRN
  Filled 2017-07-03: qty 5

## 2017-07-03 MED ORDER — ONDANSETRON HCL 4 MG PO TABS: 4.0000 mg | ORAL_TABLET | Freq: Four times a day (QID) | ORAL | Status: DC | PRN

## 2017-07-03 MED ORDER — SODIUM CHLORIDE 0.9 % IV SOLN
INTRAVENOUS | Status: DC
  Administered 2017-07-03: 01:00:00 via INTRAVENOUS

## 2017-07-03 MED ORDER — MAGNESIUM HYDROXIDE 400 MG/5ML PO SUSP: 5.0000 mL | ORAL | Status: DC | PRN

## 2017-07-03 MED ORDER — HEPARIN SODIUM (PORCINE) 5000 UNIT/ML IJ SOLN
5000.0000 [IU] | Freq: Three times a day (TID) | INTRAMUSCULAR | Status: DC
  Administered 2017-07-03 – 2017-07-04 (×5): 5000 [IU] via SUBCUTANEOUS
  Filled 2017-07-03 (×5): qty 1

## 2017-07-03 MED ORDER — ONDANSETRON HCL 4 MG/2ML IJ SOLN: 4.0000 mg | Freq: Four times a day (QID) | INTRAMUSCULAR | Status: DC | PRN

## 2017-07-03 MED ORDER — ACETAMINOPHEN 325 MG PO TABS: 650.0000 mg | ORAL_TABLET | Freq: Four times a day (QID) | ORAL | Status: DC | PRN

## 2017-07-03 MED ORDER — BISACODYL 5 MG PO TBEC: 5.0000 mg | DELAYED_RELEASE_TABLET | Freq: Every day | ORAL | Status: DC | PRN

## 2017-07-03 MED ORDER — DOCUSATE SODIUM 100 MG PO CAPS
100.0000 mg | ORAL_CAPSULE | Freq: Two times a day (BID) | ORAL | Status: DC
  Administered 2017-07-03 – 2017-07-04 (×4): 100 mg via ORAL
  Filled 2017-07-03 (×4): qty 1

## 2017-07-03 MED ORDER — ALBUTEROL SULFATE (2.5 MG/3ML) 0.083% IN NEBU
2.5000 mg | INHALATION_SOLUTION | Freq: Four times a day (QID) | RESPIRATORY_TRACT | Status: DC
  Administered 2017-07-03 – 2017-07-04 (×5): 2.5 mg via RESPIRATORY_TRACT
  Filled 2017-07-03 (×6): qty 3

## 2017-07-03 MED ORDER — HYDROCORTISONE ACETATE 25 MG RE SUPP
25.0000 mg | Freq: Two times a day (BID) | RECTAL | Status: DC | PRN
  Administered 2017-07-03 (×2): 25 mg via RECTAL
  Filled 2017-07-03 (×4): qty 1

## 2017-07-03 MED ORDER — NITROGLYCERIN 0.4 MG SL SUBL: 0.4000 mg | SUBLINGUAL_TABLET | SUBLINGUAL | Status: DC | PRN

## 2017-07-03 MED ORDER — PANTOPRAZOLE SODIUM 40 MG PO TBEC
40.0000 mg | DELAYED_RELEASE_TABLET | Freq: Every day | ORAL | Status: DC
  Administered 2017-07-03 – 2017-07-04 (×2): 40 mg via ORAL
  Filled 2017-07-03 (×2): qty 1

## 2017-07-03 MED ORDER — TOLNAFTATE 1 % EX AERP: 1.0000 "application " | INHALATION_SPRAY | Freq: Two times a day (BID) | CUTANEOUS | Status: DC

## 2017-07-03 MED ORDER — KETOROLAC TROMETHAMINE 15 MG/ML IJ SOLN: 15.0000 mg | Freq: Four times a day (QID) | INTRAMUSCULAR | Status: DC | PRN

## 2017-07-03 MED ORDER — LORATADINE 10 MG PO TABS
10.0000 mg | ORAL_TABLET | Freq: Every day | ORAL | Status: DC
  Administered 2017-07-03 – 2017-07-04 (×2): 10 mg via ORAL
  Filled 2017-07-03 (×2): qty 1

## 2017-07-03 MED ORDER — CYANOCOBALAMIN 1000 MCG/ML IJ SOLN
1000.0000 ug | INTRAMUSCULAR | Status: DC
  Administered 2017-07-03: 1000 ug via INTRAMUSCULAR
  Filled 2017-07-03: qty 1

## 2017-07-03 NOTE — Progress Notes (Signed)
Patient ID: Hannah Vaughan, female   DOB: 1941-02-11, 76 y.o.   MRN: [id number]  [employer] PROGRESS NOTE  Hannah Vaughan:[id number] DOB: [birth year] DOA: 07-09-2017 PCP: Lauro Regulus, MD  HPI/Subjective: Patient with audible wheezing. She normally eats pured diet.  Feels a little bit better than yesterday  Objective: Vitals:   07/03/17 1433 07/03/17 1455  BP: (!) 131/95 116/75  Pulse:  (!) 111  Resp:  (!) 24  Temp:    SpO2:  98%    Filed Weights   07/09/2017 2007 07/03/17 0027  Weight: 52.2 kg (115 lb) 56.9 kg (125 lb 6.4 oz)    ROS: Review of Systems  Unable to perform ROS: Acuity of condition  Respiratory: Positive for cough, shortness of breath and wheezing.   Cardiovascular: Negative for chest pain.  Gastrointestinal: Negative for abdominal pain, constipation, diarrhea, nausea and vomiting.  Musculoskeletal: Negative for joint pain.   Exam: Physical Exam  HENT:  Nose: No mucosal edema.  Mouth/Throat: No oropharyngeal exudate.  Eyes: Pupils are equal, round, and reactive to light. Conjunctivae and lids are normal.  Neck: Carotid bruit is not present. No thyromegaly present.  Cardiovascular: Regular rhythm, S1 normal, S2 normal and normal heart sounds.   Respiratory: She has decreased breath sounds in the right lower field, the left middle field and the left lower field. She has wheezes in the right upper field, the right middle field and the left upper field. She has no rhonchi. She has no rales.  Audible upper airway wheeze  GI: Soft. Bowel sounds are normal. There is no tenderness.  Musculoskeletal:       Right ankle: She exhibits swelling.       Left ankle: She exhibits swelling.  Neurological: She is alert.  Skin: Skin is warm. No rash noted. Nails show no clubbing.  Psychiatric: She has a normal mood and affect.      Data Reviewed: Basic Metabolic Panel:  Recent Labs Lab 2017-07-09 2045 07/03/17 0041  NA 136 136  K 3.9 3.7  CL 98* 102   CO2 26 26  GLUCOSE 126* 160*  BUN 27* 29*  CREATININE 0.91 0.83  CALCIUM 9.1 8.5*   CBC:  Recent Labs Lab 2017/07/09 2045 07/03/17 0041  WBC 22.1* 23.0*  HGB 10.3* 9.5*  HCT 33.4* 31.0*  MCV 71.3* 71.9*  PLT 316 266   Cardiac Enzymes:  Recent Labs Lab 07-09-17 2045  TROPONINI 0.03*   BNP (last 3 results)  Recent Labs  Jul 09, 2017 2334  BNP 1,907.0*      Recent Results (from the past 240 hour(s))  Blood Culture (routine x 2)     Status: None (Preliminary result)   Collection Time: 07/09/2017 10:38 PM  Result Value Ref Range Status   Specimen Description BLOOD RT HAND  Final   Special Requests   Final    BOTTLES DRAWN AEROBIC AND ANAEROBIC Blood Culture results may not be optimal due to an inadequate volume of blood received in culture bottles   Culture NO GROWTH < 12 HOURS  Final   Report Status PENDING  Incomplete  Blood Culture (routine x 2)     Status: None (Preliminary result)   Collection Time: 2017-07-09 10:54 PM  Result Value Ref Range Status   Specimen Description BLOOD LT HAND  Final   Special Requests   Final    BOTTLES DRAWN AEROBIC ONLY Blood Culture results may not be optimal due to an inadequate volume of blood received in culture  bottles   Culture NO GROWTH < 12 HOURS  Final   Report Status PENDING  Incomplete  MRSA PCR Screening     Status: None   Collection Time: 07/03/17  1:22 AM  Result Value Ref Range Status   MRSA by PCR NEGATIVE NEGATIVE Final    Comment:        The GeneXpert MRSA Assay (FDA approved for NASAL specimens only), is one component of a comprehensive MRSA colonization surveillance program. It is not intended to diagnose MRSA infection nor to guide or monitor treatment for MRSA infections.      Studies: Dg Chest 2 View  Result Date: 07/01/2017 CLINICAL DATA:  Shortness of breath EXAM: CHEST  2 VIEW COMPARISON:  04/30/2017, CT 05/08/2015, radiograph 11/06/2016 FINDINGS: Small right pleural effusion. Moderate large left  pleural effusion. Consolidation at the lingula and left lung base. Enlarged cardiomediastinal silhouette with vascular congestion and mild interstitial edema. No pneumothorax. Curvilinear calcification in the region of cardiac apex could reflect myocardial/left ventricularcalcification or possibly pericardial calcification. Degenerative changes of the spine. IMPRESSION: 1. Cardiomegaly with vascular congestion and mild interstitial edema 2. Bilateral pleural effusions, small on the right and moderate to large on the left. 3. Dense left lower lung zone airspace consolidation may reflect atelectasis or pneumonia Electronically Signed   By: Jasmine PangKim  Fujinaga M.D.   On: 07/12/2017 21:27    Scheduled Meds: . albuterol  2.5 mg Nebulization Q6H  . aspirin EC  81 mg Oral Daily  . budesonide (PULMICORT) nebulizer solution  0.5 mg Nebulization BID  . cyanocobalamin  1,000 mcg Intramuscular Q30 days  . docusate sodium  100 mg Oral BID  . gabapentin  600 mg Oral QID  . heparin  5,000 Units Subcutaneous Q8H  . hydrocortisone-pramoxine  1 applicator Rectal BID  . iron polysaccharides  150 mg Oral Daily  . isosorbide mononitrate  60 mg Oral Daily  . loratadine  10 mg Oral Daily  . nystatin   Topical BID  . pantoprazole  40 mg Oral Daily  . PHENObarbital  60 mg Oral QHS  . phenytoin  100 mg Oral TID   Continuous Infusions: . sodium chloride 50 mL/hr at 07/03/17 1344  . azithromycin    . piperacillin-tazobactam (ZOSYN)  IV      Assessment/Plan:  1. Clinical sepsis. Leukocytosis, tachycardia. Likely pneumonia.  Could be aspiration pneumonia. Change antibiotics to Zosyn and continue Zithromax at this point. 2. Acute hypoxic respiratory failure continue oxygen supplementation 3. Lactic acidosis. IV fluid hydration 4. Moderate to large left pleural effusion. Thoracentesis ordered. 5. Wheeze. Start nebulizer treatment with budesone nebulizers. May end up needing systemic steroids. 6. Seizure disorder continue  seizure medications 7. Elevation of BNP. Check an echocardiogram 8. Will also get swallow evaluation  Code Status:     Code Status Orders        Start     Ordered   07/03/17 0022  Full code  Continuous     07/03/17 0021    Code Status History    Date Active Date Inactive Code Status Order ID Comments User Context   04/29/2017  9:13 PM 05/09/2017  7:07 PM Full Code [id number]  Henrene DodgePiscoya, Jose, MD ED   03/31/2017 11:16 PM 04/02/2017  8:03 PM Full Code [id number]  Oralia ManisWillis, David, MD Inpatient   11/26/2016  3:02 PM 11/28/2016  6:18 PM Full Code [id number]  Milagros LollSudini, Srikar, MD ED   05/06/2015 11:52 PM 05/09/2015  8:04 PM Full Code [id number]  Oralia ManisWillis, David,  MD Inpatient     Family Communication: sister and caregiver at the bedside Disposition Plan: to be determined  Antibiotics:  Zosyn   Zithromax  Time spent: 28 minutes  Alford Highland  [employer]

## 2017-07-03 NOTE — Progress Notes (Signed)
Pt. Second lactic acid is 2.7. Dr. Tobi BastosPyreddy notified and gave no new orders

## 2017-07-03 NOTE — Clinical Social Work Note (Signed)
Clinical Social Work Assessment  Patient Details  Name: Hannah Vaughan MRN: [id number] Date of Birth: [birth year]  Date of referral:  07/03/17               Reason for consult:  Other (Comment Required) ([facility] )                Permission sought to share information with:  Vaughan Industrial/product designerContact Representative Permission granted to share information::  Yes, Verbal Permission Granted  Name::      [facility]   Agency::     Relationship::     Contact Information:     Housing/Transportation Living arrangements for the past 2 months:  Group Home Source of Information:  Power of Attorney, Vaughan Patient Interpreter Needed:  None Criminal Activity/Legal Involvement Pertinent to Current Situation/Hospitalization:  No - Comment as needed Significant Relationships:  Siblings Lives with:  Vaughan Resident Do you feel safe going back to the place where you live?    Need for family participation in patient care:  Yes (Comment)  Care giving concerns:  Patient [facility] a [facility] in [place] for 22 years.    Social Worker assessment / plan:  Visual merchandiserClinical Social Worker (CSW) reviewed chart and noted that patient is from [facility]. CSW contacted [facility] nurse Hannah Vaughan to get more information. Per Hannah Vaughan patient's sister Hannah Vaughan is her guardian. Per Hannah Vaughan patient uses a wheel chair for long distances and can walk with a walker for short distances. Per Hannah Vaughan patient is on room air at baseline and cannot return to the group home if she requires oxygen. Per Hannah Vaughan she will [facility]. CSW contacted patient's sister/ guardian Hannah Vaughan and made her aware of above. Per guardian [place] and does not want patient to go to a nursing home. Per guardian patient has been at the group home for 22 years and she would really like for her to return there. Per guardian [facility] will [facility]. CSW made charge nurse  aware of above and to attempt to wean patient off of oxygen. CSW will continue to follow and assist as needed.   Employment status:  Disabled (Comment on whether or not currently receiving Disability) Insurance information:  Medic[place], [place] Recommendations:  Not assessed at this time Information / Referral to community resources:  Other (Comment Required) (Patient's sister/ guardian would like for her to return to the group home. )  Patient/Family's Response to care:  Patient's guardian would like for patient to return to [facility].   Patient/Family's Understanding of and Emotional Response to Diagnosis, Current Treatment, and Prognosis:  Patient's guardian was very pleasant and thanked CSW for assistance.   Emotional Assessment Appearance:  Appears stated age Attitude/Demeanor/Rapport:    Affect (typically observed):  Pleasant Orientation:  Oriented to Self, Fluctuating Orientation (Suspected and/or reported Sundowners) Alcohol / Substance use:  Not Applicable Psych involvement (Current and /or in the community):  No (Comment)  Discharge Needs  Concerns to be addressed:  Discharge Planning Concerns Readmission within the last 30 days:  No Current discharge risk:  Cognitively Impaired, Dependent with Mobility Barriers to Discharge:  Continued Medical Work up   [employer], Hannah CrockerBailey M, LCSW 07/03/2017, 9:34 AM

## 2017-07-03 NOTE — ED Notes (Signed)
Pt transported to room 138 

## 2017-07-03 NOTE — NC FL2 (Signed)
[place] MEDICAID FL2 LEVEL OF CARE SCREENING TOOL     IDENTIFICATION  Patient Name: Hannah Vaughan Birthdate: 1940/09/26 Sex: female Admission Date (Current Location): 2017/07/22  [facility] and [place] Number:  [address]  ([id number] [facility]) Facility and Address:  [facility], [address] [place], [place] [address]      Provider Number: [id number]  Attending Physician Name and Address:  Alford Highland, MD  Relative Name and Phone Number:       Current Level of Care: Hospital Recommended Level of Care: Other (Comment) (Group Home) Prior Approval Number:    Date Approved/Denied:   PASRR Number:  ([id number] )  Discharge Plan: Domiciliary (Rest home)    Current Diagnoses: Patient Active Problem List   Diagnosis Date Noted  . Sepsis (HCC) 22-Jul-2017  . SBO (small bowel obstruction) (HCC) 04/29/2017  . Small bowel obstruction (HCC) 04/29/2017  . Lower urinary tract infectious disease 03/31/2017  . Anemia, iron deficiency 12/12/2016  . NSTEMI (non-ST elevated myocardial infarction) (HCC) 11/26/2016  . MR (mental retardation) 10/31/2016  . Acute respiratory failure with hypoxemia (HCC) 05/06/2015  . HTN (hypertension) 05/06/2015  . Seizure disorder (HCC) 05/06/2015  . CAD (coronary artery disease) 05/06/2015  . Osteoarthritis 05/06/2015  . GERD (gastroesophageal reflux disease) 05/06/2015  . HCAP (healthcare-associated pneumonia) 05/06/2015  . Abnormal ECG 06/24/2014  . 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 Dependent edema 06/06/2014  . HLD (hyperlipidemia) 06/06/2014    Orientation RESPIRATION BLADDER Height & Weight     Self  O2 (2 Litesr Oxygen ) Incontinent Weight: 125 lb 6.4 oz (56.9 kg) Height:  4\' 9"  (144.8 cm)  BEHAVIORAL SYMPTOMS/MOOD NEUROLOGICAL BOWEL NUTRITION STATUS    Convulsions/Seizures (history of seizures. ) Continent Diet (Diet: DYS 1 )  AMBULATORY STATUS COMMUNICATION OF NEEDS Skin   Extensive Assist Verbally Normal                      Personal Care Assistance Level of Assistance  Bathing, Feeding, Dressing Bathing Assistance: Limited assistance Feeding assistance: Independent Dressing Assistance: Limited assistance     Functional Limitations Info  Sight, Hearing, Speech Sight Info: Adequate Hearing Info: Adequate Speech Info: Adequate    SPECIAL CARE FACTORS FREQUENCY  PT (By licensed PT)     PT Frequency:  (2-3 home health )              Contractures      Additional Factors Info  Code Status, Allergies Code Status Info:  (Full Code. ) Allergies Info:  (Depakote Valproic Acid, Divalproex Sodium)           Current Medications (07/03/2017):  This is the current hospital active medication list Current Facility-Administered Medications  Medication Dose Route Frequency Provider Last Rate Last Dose  . acetaminophen (TYLENOL) tablet 650 mg  650 mg Oral Q6H PRN Shaune Pollack, MD       Or  . acetaminophen (TYLENOL) suppository 650 mg  650 mg Rectal Q6H PRN Shaune Pollack, MD      . albuterol (PROVENTIL) (2.5 MG/3ML) 0.083% nebulizer solution 2.5 mg  2.5 mg Nebulization Q2H PRN Shaune Pollack, MD      . aspirin EC tablet 81 mg  81 mg Oral Daily Shaune Pollack, MD      . azithromycin (ZITHROMAX) 500 mg in dextrose 5 % 250 mL IVPB  500 mg Intravenous Q24H Minna Antis, MD      . bisacodyl (DULCOLAX) EC tablet 5 mg  5 mg Oral Daily PRN Shaune Pollack,  MD      . cefTRIAXone (ROCEPHIN) 1 g in dextrose 5 % 50 mL IVPB  1 g Intravenous Q24H Minna AntisPaduchowski, Kevin, MD      . cyanocobalamin ((VITAMIN B-12)) injection 1,000 mcg  1,000 mcg Intramuscular Q30 days Shaune Pollackhen, Qing, MD      . docusate sodium (COLACE) capsule 100 mg  100 mg Oral BID Shaune Pollackhen, Qing, MD   100 mg at 07/03/17 0116  . gabapentin (NEURONTIN) capsule 600 mg  600 mg Oral QID Shaune Pollackhen, Qing, MD      . guaifenesin (ROBITUSSIN) 100 MG/5ML syrup 5 mL  5 mL Oral Q6H PRN Shaune Pollackhen, Qing, MD      . heparin injection 5,000 Units  5,000 Units Subcutaneous Q8H Shaune Pollackhen, Qing, MD    5,000 Units at 07/03/17 [phone]  . HYDROcodone-acetaminophen (NORCO/VICODIN) 5-325 MG per tablet 1-2 tablet  1-2 tablet Oral Q4H PRN Shaune Pollackhen, Qing, MD      . hydrocortisone (ANUSOL-HC) suppository 25 mg  25 mg Rectal BID PRN Shaune Pollackhen, Qing, MD      . hydrocortisone-pramoxine [facility](PROCTOFOAM-HC) rectal foam 1 applicator  1 applicator Rectal BID Shaune Pollackhen, Qing, MD      . iron polysaccharides (NIFEREX) capsule 150 mg  150 mg Oral Daily Shaune Pollackhen, Qing, MD      . isosorbide mononitrate (IMDUR) 24 hr tablet 60 mg  60 mg Oral Daily Shaune Pollackhen, Qing, MD      . ketorolac (TORADOL) 15 MG/ML injection 15 mg  15 mg Intravenous Q6H PRN Shaune Pollackhen, Qing, MD      . loperamide (IMODIUM) capsule 2 mg  2 mg Oral PRN Shaune Pollackhen, Qing, MD      . loratadine (CLARITIN) tablet 10 mg  10 mg Oral Daily Shaune Pollackhen, Qing, MD      . magnesium hydroxide (MILK OF MAGNESIA) suspension 5 mL  5 mL Oral PRN Shaune Pollackhen, Qing, MD      . nitroGLYCERIN (NITROSTAT) SL tablet 0.4 mg  0.4 mg Sublingual PRN Shaune Pollackhen, Qing, MD      . nystatin (MYCOSTATIN/NYSTOP) topical powder   Topical BID Shaune Pollackhen, Qing, MD      . ondansetron [facility](ZOFRAN) tablet 4 mg  4 mg Oral Q6H PRN Shaune Pollackhen, Qing, MD       Or  . ondansetron [facility](ZOFRAN) injection 4 mg  4 mg Intravenous Q6H PRN Shaune Pollackhen, Qing, MD      . pantoprazole (PROTONIX) EC tablet 40 mg  40 mg Oral Daily Shaune Pollackhen, Qing, MD      . PHENObarbital 20 MG/5ML elixir 60 mg  60 mg Oral QHS Shaune Pollackhen, Qing, MD   60 mg at 07/03/17 0116  . phenytoin (DILANTIN) ER capsule 100 mg  100 mg Oral TID Shaune Pollackhen, Qing, MD      . senna-docusate (Senokot-S) tablet 1 tablet  1 tablet Oral QHS PRN Shaune Pollackhen, Qing, MD      . traMADol Janean Sark(ULTRAM) tablet 50 mg  50 mg Oral Q6H PRN Shaune Pollackhen, Qing, MD         Discharge Medications: Please see discharge summary for a list of discharge medications.  Relevant Imaging Results:  Relevant Lab Results:   Additional Information  (SSN: [id number])  Asah Lamay, Darleen CrockerBailey M, LCSW

## 2017-07-03 NOTE — H&P (Signed)
[facility] [place] [facility]   PATIENT NAME: Hannah Vaughan    MR#:  [id number]  DATE OF BIRTH:  April 10, 1941  DATE OF ADMISSION:  06/23/2017  PRIMARY CARE PHYSICIAN: Lauro RegulusAnderson, Marshall W, MD   REQUESTING/REFERRING PHYSICIAN: Minna AntisPaduchowski, Kevin, MD  CHIEF COMPLAINT:   Chief Complaint  Patient presents with  . Shortness of Breath   Shortness of breath today. HISTORY OF PRESENT ILLNESS:  Hannah Vaughan  is a 76 y.o. female with a known history of CAD, hypertension, GERD, seizure disorder and mental retardation. The patient was sent from group home to the ED due to above chief complaints. The patient cannot communicate. According to the patient's Caver, the patient was found somewhat shortness breath and wheezing today. The patient was found hypoxia at 89% in room air, put on oxygen and it cannular. She also has fever 99.4 and leukocytosis 22,000.  Chest x-ray in the ED showed left-sided pneumonia. ED physician and started sepsis protocol and given antibiotics.  PAST MEDICAL HISTORY:   Past Medical History:  Diagnosis Date  . CAD (coronary artery disease)   . GERD (gastroesophageal reflux disease)   . History of hiatal hernia   . HTN (hypertension)   . Mental retardation   . Osteoarthritis   . Pneumococcus infection   . Seasonal allergies   . Seizure (HCC)   . Seizure (HCC)     PAST SURGICAL HISTORY:   Past Surgical History:  Procedure Laterality Date  . CHOLECYSTECTOMY    . LAPAROTOMY N/A 04/29/2017   Procedure: EXPLORATORY LAPAROTOMY, lysis of adhesions;  Surgeon: Henrene DodgePiscoya, Jose, MD;  Location: [facility] ORS;  Service: General;  Laterality: N/A;    SOCIAL HISTORY:   Social History  Substance Use Topics  . Smoking status: Never Smoker  . Smokeless tobacco: Never Used  . Alcohol use No    FAMILY HISTORY:   Family History  Problem Relation Age of Onset  . CAD Mother   . Congestive Heart Failure Mother   . CVA Father   . Heart attack Brother   .  Hypertension Sister   . Diabetes Sister     DRUG ALLERGIES:   Allergies  Allergen Reactions  . Depakote [Valproic Acid]   . Divalproex Sodium Other (See Comments)    REVIEW OF SYSTEMS:   Review of Systems  Unable to perform ROS: Mental acuity    MEDICATIONS AT HOME:   Prior to Admission medications   Medication Sig Start Date End Date Taking? Authorizing Provider  acetaminophen (TYLENOL) 325 MG tablet Take 1 tablet by mouth every 6 (six) hours.   Yes [provider]  Aluminum & Magnesium Hydroxide (MALDROXAL ANTACID PO) Take by mouth as needed.   Yes [provider]  aspirin EC 81 MG tablet Take 1 tablet by mouth daily. 11/11/14  Yes [provider]  bacitracin-polymyxin b (POLYSPORIN) ointment Apply 1 application topically as needed.   Yes [provider]  calcium-vitamin D (OSCAL WITH D) 500-200 MG-UNIT TABS tablet Take 1 tablet by mouth 1 day or 1 dose. 02/25/17  Yes [provider]  carbamide peroxide (EAR DROPS) 6.5 % OTIC solution Place 1 drop in ear(s) daily.   Yes [provider]  cetirizine (ZYRTEC) 10 MG tablet Take 1 tablet by mouth daily as needed. 03/25/14  Yes [provider]  cyanocobalamin (,VITAMIN B-12,) 1000 MCG/ML injection Inject 1 mL into the muscle every 30 (thirty) days.   Yes [provider]  docusate sodium (STOOL  SOFTENER) 100 MG capsule Take 1 capsule by mouth 2 (two) times daily.   Yes [provider]  gabapentin (NEURONTIN) 600 MG tablet Take 1 tablet by mouth 4 (four) times daily.  06/11/14  Yes [provider]  guaifenesin (COUGH SYRUP) 100 MG/5ML syrup Take 5 mLs by mouth as needed.   Yes [provider]  hydrochlorothiazide (HYDRODIURIL) 25 MG tablet Take 1 tablet by mouth daily. 02/25/17  Yes [provider]  hydrocortisone (ANUSOL-HC) 25 MG suppository Place 25 mg rectally 2 (two) times daily as needed for hemorrhoids or itching.   Yes [provider]  hydrocortisone-pramoxine (PROCTOFOAM-HC) rectal foam Place 1 applicator rectally 2 (two) times daily.   Yes [provider]  iron polysaccharides (NIFEREX) 150 MG capsule Take 150 mg by mouth daily. 12/12/16 12/12/17 Yes [provider]  isosorbide mononitrate (IMDUR) 60 MG 24 hr tablet Take 1 tablet by mouth daily. 02/11/14  Yes [provider]  loperamide (IMODIUM) 2 MG capsule Take 2 mg by mouth as needed for diarrhea or loose stools.   Yes [provider]  LORazepam (ATIVAN) 0.5 MG tablet Take 0.5 mg by mouth every 8 (eight) hours.   Yes [provider]  magnesium hydroxide (MILK OF MAGNESIA) 400 MG/5ML suspension Take 5 mLs by mouth as needed.   Yes [provider]  metoprolol succinate (TOPROL-XL) 25 MG 24 hr tablet Take 1 tablet by mouth daily.   Yes [provider]  miconazole (MICOTIN) 2 % powder Apply 1 application topically as needed for itching.   Yes [provider]  mupirocin ointment (BACTROBAN) 2 % Place 1 application into the nose 3 (three) times daily.   Yes [provider]  nitroGLYCERIN (NITROSTAT) 0.4 MG SL tablet Place 1 tablet under the tongue as needed. 04/06/15  Yes [provider]  nystatin (NYSTATIN) powder Apply 1 g topically as needed.   Yes [provider]  omeprazole (PRILOSEC) 20 MG capsule Take 1 capsule by mouth daily. 01/06/15  Yes [provider]  ondansetron (ZOFRAN-ODT) 4 MG disintegrating tablet Take 1 tablet by mouth as needed. 03/31/17  Yes [provider]  PHENObarbital 20 MG/5ML elixir Take 15 mLs by mouth at bedtime.   Yes [provider]  phenytoin (DILANTIN) 100 MG ER capsule Take 100 mg by mouth 3 (three) times daily.   Yes [provider]  Skin Protectants, Misc. (EUCERIN) cream Apply 1 application topically 2 (two) times daily.    Yes [provider]  terbinafine (LAMISIL) 1 % cream Apply 1  application topically daily.   Yes [provider]  tolnaftate (TINACTIN) 1 % spray Apply 1 application topically 2 (two) times daily.   Yes [provider]  traMADol (ULTRAM) 50 MG tablet Take 1 tablet (50 mg total) by mouth every 6 (six) hours as needed. 05/09/15  Yes Enid Baas, MD  triamcinolone cream (KENALOG) 0.1 % Apply 1 application topically 2 (two) times daily.   Yes [provider]      VITAL SIGNS:  Blood pressure 117/67, pulse 76, temperature 99.4 F (37.4 C), temperature source Oral, resp. rate (!) 21, height 4\' 9"  (1.448 m), weight 115 lb (52.2 kg), SpO2 90 %.  PHYSICAL EXAMINATION:  Physical Exam  GENERAL:  76 y.o.-year-old patient lying in the bed with no acute distress.  EYES: Pupils equal, round, reactive to light and accommodation. No scleral icterus. Extraocular muscles intact.  HEENT: Head atraumatic, normocephalic. Oropharynx and nasopharynx clear.  NECK:  Supple, no jugular venous distention. No thyroid enlargement, no tenderness.  LUNGS: Normal breath sounds bilaterally, mild wheezing, no rales,rhonchi or crepitation. No use of accessory muscles of respiration.  CARDIOVASCULAR: S1, S2 normal. No murmurs, rubs, or gallops.  ABDOMEN: Soft, nontender, nondistended. Bowel sounds present. No organomegaly or mass.  EXTREMITIES: No pedal edema, cyanosis, or clubbing.  NEUROLOGIC: unable to exam. PSYCHIATRIC: The patient is alwake but non-communicative. SKIN: No obvious rash, lesion, or ulcer.   LABORATORY PANEL:   CBC  Recent Labs Lab 07/17/2017 2045  WBC 22.1*  HGB 10.3*  HCT 33.4*  PLT 316   ------------------------------------------------------------------------------------------------------------------  Chemistries   Recent Labs Lab 06/21/2017 2045  NA 136  K 3.9  CL 98*  CO2 26  GLUCOSE 126*  BUN 27*  CREATININE 0.91  CALCIUM 9.1    ------------------------------------------------------------------------------------------------------------------  Cardiac Enzymes  Recent Labs Lab 07/17/2017 2045  TROPONINI 0.03*   ------------------------------------------------------------------------------------------------------------------  RADIOLOGY:  Dg Chest 2 View  Result Date: 07/12/2017 CLINICAL DATA:  Shortness of breath EXAM: CHEST  2 VIEW COMPARISON:  04/30/2017, CT 05/08/2015, radiograph 11/06/2016 FINDINGS: Small right pleural effusion. Moderate large left pleural effusion. Consolidation at the lingula and left lung base. Enlarged cardiomediastinal silhouette with vascular congestion and mild interstitial edema. No pneumothorax. Curvilinear calcification in the region of cardiac apex could reflect myocardial/left ventricularcalcification or possibly pericardial calcification. Degenerative changes of the spine. IMPRESSION: 1. Cardiomegaly with vascular congestion and mild interstitial edema 2. Bilateral pleural effusions, small on the right and moderate to large on the left. 3. Dense left lower lung zone airspace consolidation may reflect atelectasis or pneumonia Electronically Signed   By: Jasmine Pang M.D.   On: 07/13/2017 21:27      IMPRESSION AND PLAN:   Sepsis due to pneumonia, CPAP. The patient will be admitted to medical floor. Continue antibiotics, follow-up CBC and cultures. Robitussin when necessary. NEB when necessary.  Acute respiratory failure with hypoxia due to pneumonia. Continue oxygen by nasal cannular and nebulizer.  Lactic acidosis.Follow-up lactic acid level. Treatment as above.  Dehydration. Start normal saline IV and follow-up BMP. Hold HCTZ.  Hypertension. Continue hypertension medication.  All the records are reviewed and case discussed with ED provider. Management plans discussed with the patient's caregiver and they are in agreement.  CODE STATUS:  Full code per caregiver.  TOTAL  TIME TAKING CARE OF THIS PATIENT: 53 minutes.    Shaune Pollack M.D on 07/03/2017 at 12:00 AM  Between 7am to 6pm - Pager - [phone]  After 6pm go to [email or web address] - Scientist, research (life sciences) [facility]  Office  [phone]  CC: Primary care physician; Lauro Regulus, MD   Note: This dictation was prepared with Dragon dictation along with smaller phrase technology. Any transcriptional errors that result from this process are unin

## 2017-07-03 NOTE — Progress Notes (Signed)
Pharmacy Antibiotic Note  Hannah GroutDoris E Holian is a 76 y.o. female admitted on 26-Apr-2017 with aspiration PNA.  Pharmacy has been consulted for piperacillin/tazobactam dosing.  Plan: Piperacillin/tazobactam 3.375 g IV q8h EI  Height: 4\' 9"  (144.8 cm) Weight: 125 lb 6.4 oz (56.9 kg) IBW/kg (Calculated) : 38.6  Temp (24hrs), Avg:98.2 F (36.8 C), Min:97 F (36.1 C), Max:99.4 F (37.4 C)   Recent Labs Lab 12-05-16 2045 12-05-16 2238 07/03/17 0040 07/03/17 0041  WBC 22.1*  --   --  23.0*  CREATININE 0.91  --   --  0.83  LATICACIDVEN  --  3.2* 2.7*  --     Estimated Creatinine Clearance: 43.1 mL/min (by C-G formula based on SCr of 0.83 mg/dL).    Allergies  Allergen Reactions  . Depakote [Valproic Acid]   . Divalproex Sodium Other (See Comments)    Antimicrobials this admission: azithromycin 10/16 >>  ceftriaxone 10/16 >> 10/17 Piperacillin/tazobactam 10/17 >>  Dose adjustments this admission:  Microbiology results: 10/16 BCx: No growth <12 hours 10/16 UCx: Sent  1/17 MRSA PCR: Negative  Thank you for allowing pharmacy to be a part of this patient's care.  Cindi CarbonMary M Sury Wentworth, PharmD, BCPS Clinical Pharmacist 07/03/2017 1:50 PM

## 2017-07-04 ENCOUNTER — Inpatient Hospital Stay
Admit: 2017-07-04 | Discharge: 2017-07-04 | Disposition: A | Discharge status: Home / Self Care | Payer: Medicare Other | Attending: Internal Medicine | Admitting: Internal Medicine

## 2017-07-04 LAB — BASIC METABOLIC PANEL
Anion gap: 5 (ref 5–15)
BUN: 36 mg/dL — AB (ref 6–20)
CO2: 25 mmol/L (ref 22–32)
Calcium: 8 mg/dL — ABNORMAL LOW (ref 8.9–10.3)
Chloride: 105 mmol/L (ref 101–111)
Creatinine, Ser: 0.84 mg/dL (ref 0.44–1.00)
GFR calc Af Amer: >60 mL/min (ref >60)
GFR calc non Af Amer: >60 mL/min (ref >60)
GLUCOSE: 140 mg/dL — AB (ref 65–99)
Potassium: 3.9 mmol/L (ref 3.5–5.1)
Sodium: 135 mmol/L (ref 135–145)

## 2017-07-04 LAB — CBC
HEMATOCRIT: 28 % — AB (ref 35.0–47.0)
Hemoglobin: 8.5 g/dL — ABNORMAL LOW (ref 12.0–16.0)
MCH: 21.6 pg — ABNORMAL LOW (ref 26.0–34.0)
MCHC: 30.5 g/dL — AB (ref 32.0–36.0)
MCV: 70.8 fL — AB (ref 80.0–100.0)
Platelets: 261 10*3/uL (ref 150–440)
RBC: 3.96 MIL/uL (ref 3.80–5.20)
RDW: 20.4 % — AB (ref 11.5–14.5)
WBC: 14.3 10*3/uL — ABNORMAL HIGH (ref 3.6–11.0)

## 2017-07-04 LAB — GLUCOSE, CAPILLARY: GLUCOSE-CAPILLARY: 225 mg/dL — AB (ref 65–99)

## 2017-07-04 LAB — CYTOLOGY - NON PAP

## 2017-07-04 MED ORDER — METOPROLOL SUCCINATE ER 25 MG PO TB24
25.0000 mg | ORAL_TABLET | Freq: Every day | ORAL | Status: DC
  Administered 2017-07-04: 25 mg via ORAL
  Filled 2017-07-04: qty 1

## 2017-07-04 MED ORDER — METOPROLOL TARTRATE 25 MG PO TABS: 12.5000 mg | ORAL_TABLET | Freq: Two times a day (BID) | ORAL | Status: DC

## 2017-07-05 LAB — ECHOCARDIOGRAM COMPLETE
Height: 57 in
WEIGHTICAEL: 2006.4 [oz_av]

## 2017-07-05 MED FILL — Medication: Qty: 1 | Status: AC

## 2017-07-07 LAB — CULTURE, BLOOD (ROUTINE X 2)
Culture: NO GROWTH
Culture: NO GROWTH

## 2017-07-07 LAB — BODY FLUID CULTURE: Culture: NO GROWTH

## 2017-07-18 NOTE — Evaluation (Signed)
Clinical/Bedside Swallow Evaluation Patient Details  Name: Hannah Vaughan MRN: [id number] Date of Birth: [birth year]  Today's Date: 07-16-17 Time: SLP Start Time (ACUTE ONLY): 1100 SLP Stop Time (ACUTE ONLY): 1120 SLP Time Calculation (min) (ACUTE ONLY): 20 min  Past Medical History:  Past Medical History:  Diagnosis Date  . CAD (coronary artery disease)   . GERD (gastroesophageal reflux disease)   . History of hiatal hernia   . HTN (hypertension)   . Mental retardation   . Osteoarthritis   . Pneumococcus infection   . Seasonal allergies   . Seizure (HCC)   . Seizure [facility])    Past Surgical History:  Past Surgical History:  Procedure Laterality Date  . CHOLECYSTECTOMY    . LAPAROTOMY N/A 04/29/2017   Procedure: EXPLORATORY LAPAROTOMY, lysis of adhesions;  Surgeon: Henrene Dodge, MD;  Location: [facility] ORS;  Service: General;  Laterality: N/A;   HPI:  76 year old female admitted 06/27/2017 with SOB and wheezing. PMH significant for mental retardation, GERD, seizures, hiatal hernia, recurrent UTI's, dysphagia.   Assessment / Plan / Recommendation Clinical Impression  Pt familiar to this SLP from previous admission. Pt appears to be at her baseline. Trials of puree and nectar thick liquid were tolerated without overt s/s. No temp elevations noted. RN states lung sounds are currently diminished. Recommend continuing with current diet (puree/ NTL).  ST will follow briefly for assessment of diet tolerance and education.  Safe swallow precautions posted at [facility] and RN informed.   SLP Visit Diagnosis: Dysphagia, unspecified (R13.10)    Aspiration Risk  Mild aspiration risk    Diet Recommendation Dysphagia 1 (Puree);Nectar-thick liquid   Liquid Administration via: Cup;Straw Medication Administration: Crushed with puree (sustained release tablets whole in puree, one at a time) Supervision: Full supervision/cueing for compensatory strategies Compensations: Minimize environmental  distractions;Slow rate;Small sips/bites Postural Changes: Remain upright for at least 30 minutes after po intake;Seated upright at 90 degrees    Other  Recommendations Oral Care Recommendations: Oral care QID Other Recommendations: Order thickener from pharmacy;Have oral suction available   Follow up Recommendations None      Frequency and Duration min 1 x/week  1 week       Prognosis Prognosis for Safe Diet Advancement: Guarded Barriers to Reach Goals: Cognitive deficits;Time post onset      Swallow Study   General Date of Onset: 06/30/2017 HPI: 76 year old female admitted 06/21/2017 with SOB and wheezing. PMH significant for mental retardation, GERD, seizures, hiatal hernia, recurrent UTI's, dysphagia. Type of Study: Bedside Swallow Evaluation Previous Swallow Assessment: BSE 05/06/17 recommended continuing with Dys1/NTL diet, which has been pt baseline for some time. Diet Prior to this Study: Dysphagia 1 (puree);Nectar-thick liquids Temperature Spikes Noted: No Respiratory Status: Nasal cannula History of Recent Intubation: No Behavior/Cognition: Cooperative;Lethargic/Drowsy;Doesn't follow directions;Requires cueing Oral Cavity Assessment: Dry Oral Care Completed by SLP: No Oral Cavity - Dentition: Edentulous Self-Feeding Abilities: Total assist Patient Positioning: Upright in bed Baseline Vocal Quality: Normal Volitional Cough: Cognitively unable to elicit Volitional Swallow: Unable to elicit    Oral/Motor/Sensory Function Overall Oral Motor/Sensory Function:  (unable to assess)   Ice Chips Ice chips: Not tested   Thin Liquid Thin Liquid: Not tested    Nectar Thick Nectar Thick Liquid: Within functional limits Presentation: Straw   Honey Thick Honey Thick Liquid: Not tested   Puree Puree: Within functional limits Presentation: Spoon   Solid   GO   Solid: Not tested    Functional Assessment Tool Used: asha  noms, clinical judgment. BSE Functional Limitations:  Swallowing Swallow Current Status [phone]([id number]): At least 40 percent but less than 60 percent impaired, limited or restricted Swallow Goal Status [phone]([id number]): At least 40 percent but less than 60 percent impaired, limited or restricted   Mason Dibiasio B. Murvin NatalBueche, Gl[facility] Falls HospitalMSP, CCC-SLP Speech Language Pathologist [phone]  Hannah Vaughan, Hannah Vaughan 12-14-2016,11:29 AM

## 2017-07-18 NOTE — Death Summary Note (Signed)
DEATH SUMMARY   Patient Details  Name: Hannah GroutDoris E Belshe MRN: [id number] DOB: 16-Oct-1940  Admission/Discharge Information   Admit Date:  02/02/17  Date of Death: Date of Death: 06/19/2017  Time of Death: Time of Death: 1916  Length of Stay: 2  Referring Physician: Lauro RegulusAnderson, Marshall W, MD   Reason(s) for Hospitalization  Sepsis and aspiration pneumonia  Diagnoses  Preliminary cause of death:  Secondary Diagnoses (including complications and co-morbidities):  Active Problems:   Sepsis [facility](HCC)   Brief Hospital Course (including significant findings, care, treatment, and services provided and events leading to death)  Hannah Vaughan is a 76 y.o. year old female who was admitted to the hospital with clinical sepsis, leukocytosis tachycardia and aspiration pneumonia.  The patient was placed on Rocephin and Zithromax and I change antibiotics over to Zosyn and Zithromax to better cover aspiration. The patient's white count had improved from 23,000 down to 14,000.  Blood cultures and pleural fluid cultures were negative to date.  The patient also had acute hypoxic respiratory failure requiring oxygen. The patient developed tachycardia the patient was given gentle IV fluids and metoprolol. The patient had a lactic acidosis and was given IV fluid hydration. The patient had a moderate to large left pleural effusion and thoracentesis on 06/23/2017 Drew off 850 mL of fluid.  The patient was doing a little bit better with regards to her breathing on 06/20/2017. Nighttime physician responded to code. Patient was found to be In pulseless and received CPR for 2s and medications via ACLS protocol. Patient was pronounced dead at 1916 on 07/10/2017.    Pertinent Labs and Studies  Significant Diagnostic Studies Dg Chest 1 View  Result Date: 07/03/2017 CLINICAL DATA:  76 year old female status post left-sided thoracentesis EXAM: CHEST 1 VIEW COMPARISON:  Pre thoracentesis x-ray 02/02/17 FINDINGS: Significant  interval reduction in left pleural effusion. No definite residual left effusion present. No evidence of pneumothorax. Stable cardiomegaly. Sub endocardial left ventricular calcification again noted. Calcifications also present within the transverse aorta. No pulmonary edema. There may be a small layering right-sided pleural effusion. No acute osseous abnormality. IMPRESSION: No evidence of pneumothorax following left thoracentesis. No significant residual left pleural effusion. Electronically Signed   By: Malachy MoanHeath  McCullough M.D.   On: 07/03/2017 15:23   Dg Chest 2 View  Result Date: 02/02/17 CLINICAL DATA:  Shortness of breath EXAM: CHEST  2 VIEW COMPARISON:  04/30/2017, CT 05/08/2015, radiograph 11/06/2016 FINDINGS: Small right pleural effusion. Moderate large left pleural effusion. Consolidation at the lingula and left lung base. Enlarged cardiomediastinal silhouette with vascular congestion and mild interstitial edema. No pneumothorax. Curvilinear calcification in the region of cardiac apex could reflect myocardial/left ventricularcalcification or possibly pericardial calcification. Degenerative changes of the spine. IMPRESSION: 1. Cardiomegaly with vascular congestion and mild interstitial edema 2. Bilateral pleural effusions, small on the right and moderate to large on the left. 3. Dense left lower lung zone airspace consolidation may reflect atelectasis or pneumonia Electronically Signed   By: Jasmine PangKim  Fujinaga M.D.   On: 02/02/17 21:27   [place] Thoracentesis Asp Pleural Space W/img Guide  Result Date: 07/03/2017 INDICATION: 76 year old female with bilateral left larger than right pleural effusions. EXAM: ULTRASOUND GUIDED LEFT THORACENTESIS MEDICATIONS: None. COMPLICATIONS: None immediate. PROCEDURE: An ultrasound guided thoracentesis was thoroughly discussed with the patient and questions answered. The benefits, risks, alternatives and complications were also discussed. The patient understands and  wishes to proceed with the procedure. Written consent was obtained. Ultrasound was performed to localize and mark an  adequate pocket of fluid in the left chest. The area was then prepped and draped in the normal sterile fashion. 1% Lidocaine was used for local anesthesia. Under ultrasound guidance a 6 Fr Safe-T-Centesis catheter was introduced. Thoracentesis was performed. The catheter was removed and a dressing applied. FINDINGS: A total of approximately 850 mL of clear yellow pleural fluid was removed. Samples were sent to the laboratory as requested by the clinical team. IMPRESSION: Successful ultrasound guided left thoracentesis yielding 850 mL of pleural fluid. Electronically Signed   By: Malachy Moan M.D.   On: 07/03/2017 15:22    Microbiology Recent Results (from the past 240 hour(s))  Blood Culture (routine x 2)     Status: None (Preliminary result)   Collection Time: 08-01-2017 10:38 PM  Result Value Ref Range Status   Specimen Description BLOOD RT HAND  Final   Special Requests   Final    BOTTLES DRAWN AEROBIC AND ANAEROBIC Blood Culture results may not be optimal due to an inadequate volume of blood received in culture bottles   Culture NO GROWTH 3 DAYS  Final   Report Status PENDING  Incomplete  Blood Culture (routine x 2)     Status: None (Preliminary result)   Collection Time: 01-Aug-2017 10:54 PM  Result Value Ref Range Status   Specimen Description BLOOD LT HAND  Final   Special Requests   Final    BOTTLES DRAWN AEROBIC ONLY Blood Culture results may not be optimal due to an inadequate volume of blood received in culture bottles   Culture NO GROWTH 3 DAYS  Final   Report Status PENDING  Incomplete  MRSA PCR Screening     Status: None   Collection Time: 07/03/17  1:22 AM  Result Value Ref Range Status   MRSA by PCR NEGATIVE NEGATIVE Final    Comment:        The GeneXpert MRSA Assay (FDA approved for NASAL specimens only), is one component of a comprehensive MRSA  colonization surveillance program. It is not intended to diagnose MRSA infection nor to guide or monitor treatment for MRSA infections.   Body fluid culture     Status: None (Preliminary result)   Collection Time: 07/03/17  2:50 PM  Result Value Ref Range Status   Specimen Description PLEURAL  Final   Special Requests NONE  Final   Gram Stain   Final    ABUNDANT WBC PRESENT,BOTH PMN AND MONONUCLEAR NO ORGANISMS SEEN    Culture   Final    NO GROWTH 2 DAYS Performed at [facility], [address], [place], [place] [address]    Report Status PENDING  Incomplete    Lab Basic Metabolic Panel:  Recent Labs Lab 2017-08-01 2045 07/03/17 0041 06/30/2017 0331  NA 136 136 135  K 3.9 3.7 3.9  CL 98* 102 105  CO2 26 26 25   GLUCOSE 126* 160* 140*  BUN 27* 29* 36*  CREATININE 0.91 0.83 0.84  CALCIUM 9.1 8.5* 8.0*   CBC:  Recent Labs Lab 2017/08/01 2045 07/03/17 0041 07/05/2017 0331  WBC 22.1* 23.0* 14.3*  HGB 10.3* 9.5* 8.5*  HCT 33.4* 31.0* 28.0*  MCV 71.3* 71.9* 70.8*  PLT 316 266 261   Cardiac Enzymes:  Recent Labs Lab August 01, 2017 2045  TROPONINI 0.03*   Sepsis Labs:  Recent Labs Lab 08-01-17 2045 Aug 01, 2017 2238 07/03/17 0040 07/03/17 0041 06/17/2017 0331  PROCALCITON  --   --  0.19  --   --   WBC 22.1*  --   --  23.0* 14.3*  LATICACIDVEN  --  3.2* 2.7*  --   --       Alford Highland 07/05/2017, 4:55 PM

## 2017-07-18 NOTE — Progress Notes (Signed)
Patient ID: Hannah Vaughan, female   DOB: 1940/12/25, 76 y.o.   MRN: [id number]  [employer] PROGRESS NOTE  Hannah Vaughan:[id number] DOB: [birth year] DOA: 06/18/2017 PCP: Lauro Regulus, MD  HPI/Subjective: Call my nurse earlier for fast heart rate. Blood pressure also on the lower side.  Patient less talkative because a sore when family was not there.    Objective: Vitals:   08/01/17 0900 08/01/2017 1120  BP: 125/83 104/81  Pulse: (!) 120 (!) 127  Resp: 16 20  Temp: 98.8 F (37.1 C)   SpO2: 96% 92%    Filed Weights   06/19/2017 2007 07/03/17 0027  Weight: 52.2 kg (115 lb) 56.9 kg (125 lb 6.4 oz)    ROS: Review of Systems  Unable to perform ROS: Acuity of condition  Respiratory: Positive for cough.   Gastrointestinal: Negative for abdominal pain.   Exam: Physical Exam  HENT:  Nose: No mucosal edema.  Mouth/Throat: No oropharyngeal exudate.  Eyes: Pupils are equal, round, and reactive to light. Conjunctivae and lids are normal.  Neck: Carotid bruit is not present. No thyromegaly present.  Cardiovascular: Regular rhythm, S1 normal, S2 normal and normal heart sounds.  Tachycardia present.   Respiratory: She has decreased breath sounds in the right lower field and the left lower field. She has no wheezes. She has no rhonchi. She has no rales.  Audible upper airway wheeze  GI: Soft. Bowel sounds are normal. There is no tenderness.  Musculoskeletal:       Right ankle: She exhibits swelling.       Left ankle: She exhibits swelling.  Neurological: She is alert.  Skin: Skin is warm. No rash noted. Nails show no clubbing.  Psychiatric: She has a normal mood and affect.      Data Reviewed: Basic Metabolic Panel:  Recent Labs Lab 07/08/2017 2045 07/03/17 0041 2017-08-01 0331  NA 136 136 135  K 3.9 3.7 3.9  CL 98* 102 105  CO2 26 26 25   GLUCOSE 126* 160* 140*  BUN 27* 29* 36*  CREATININE 0.91 0.83 0.84  CALCIUM 9.1 8.5* 8.0*   CBC:  Recent Labs Lab  07/03/2017 2045 07/03/17 0041 Aug 01, 2017 0331  WBC 22.1* 23.0* 14.3*  HGB 10.3* 9.5* 8.5*  HCT 33.4* 31.0* 28.0*  MCV 71.3* 71.9* 70.8*  PLT 316 266 261   Cardiac Enzymes:  Recent Labs Lab 07/01/2017 2045  TROPONINI 0.03*   BNP (last 3 results)  Recent Labs  07/06/2017 2334  BNP 1,907.0*      Recent Results (from the past 240 hour(s))  Blood Culture (routine x 2)     Status: None (Preliminary result)   Collection Time: 07/09/2017 10:38 PM  Result Value Ref Range Status   Specimen Description BLOOD RT HAND  Final   Special Requests   Final    BOTTLES DRAWN AEROBIC AND ANAEROBIC Blood Culture results may not be optimal due to an inadequate volume of blood received in culture bottles   Culture NO GROWTH 2 DAYS  Final   Report Status PENDING  Incomplete  Blood Culture (routine x 2)     Status: None (Preliminary result)   Collection Time: 06/21/2017 10:54 PM  Result Value Ref Range Status   Specimen Description BLOOD LT HAND  Final   Special Requests   Final    BOTTLES DRAWN AEROBIC ONLY Blood Culture results may not be optimal due to an inadequate volume of blood received in culture bottles   Culture NO GROWTH  2 DAYS  Final   Report Status PENDING  Incomplete  MRSA PCR Screening     Status: None   Collection Time: 07/03/17  1:22 AM  Result Value Ref Range Status   MRSA by PCR NEGATIVE NEGATIVE Final    Comment:        The GeneXpert MRSA Assay (FDA approved for NASAL specimens only), is one component of a comprehensive MRSA colonization surveillance program. It is not intended to diagnose MRSA infection nor to guide or monitor treatment for MRSA infections.   Body fluid culture     Status: None (Preliminary result)   Collection Time: 07/03/17  2:50 PM  Result Value Ref Range Status   Specimen Description PLEURAL  Final   Special Requests NONE  Final   Gram Stain   Final    ABUNDANT WBC PRESENT,BOTH PMN AND MONONUCLEAR NO ORGANISMS SEEN    Culture   Final    NO  GROWTH < 24 HOURS Performed at [facility], [address] [place], [place] [address]    Report Status PENDING  Incomplete     Studies: Dg Chest 1 View  Result Date: 07/03/2017 CLINICAL DATA:  76 year old female status post left-sided thoracentesis EXAM: CHEST 1 VIEW COMPARISON:  Pre thoracentesis x-ray 07/11/2017 FINDINGS: Significant interval reduction in left pleural effusion. No definite residual left effusion present. No evidence of pneumothorax. Stable cardiomegaly. Sub endocardial left ventricular calcification again noted. Calcifications also present within the transverse aorta. No pulmonary edema. There may be a small layering right-sided pleural effusion. No acute osseous abnormality. IMPRESSION: No evidence of pneumothorax following left thoracentesis. No significant residual left pleural effusion. Electronically Signed   By: Malachy Moan M.D.   On: 07/03/2017 15:23   Dg Chest 2 View  Result Date: 07/13/2017 CLINICAL DATA:  Shortness of breath EXAM: CHEST  2 VIEW COMPARISON:  04/30/2017, CT 05/08/2015, radiograph 11/06/2016 FINDINGS: Small right pleural effusion. Moderate large left pleural effusion. Consolidation at the lingula and left lung base. Enlarged cardiomediastinal silhouette with vascular congestion and mild interstitial edema. No pneumothorax. Curvilinear calcification in the region of cardiac apex could reflect myocardial/left ventricularcalcification or possibly pericardial calcification. Degenerative changes of the spine. IMPRESSION: 1. Cardiomegaly with vascular congestion and mild interstitial edema 2. Bilateral pleural effusions, small on the right and moderate to large on the left. 3. Dense left lower lung zone airspace consolidation may reflect atelectasis or pneumonia Electronically Signed   By: Jasmine Pang M.D.   On: 06/24/2017 21:27   US Thoracentesis Asp Pleural Space W/img Guide  Result Date: 07/03/2017 INDICATION: 76 year old female with  bilateral left larger than right pleural effusions. EXAM: ULTRASOUND GUIDED LEFT THORACENTESIS MEDICATIONS: None. COMPLICATIONS: None immediate. PROCEDURE: An ultrasound guided thoracentesis was thoroughly discussed with the patient and questions answered. The benefits, risks, alternatives and complications were also discussed. The patient understands and wishes to proceed with the procedure. Written consent was obtained. Ultrasound was performed to localize and mark an adequate pocket of fluid in the left chest. The area was then prepped and draped in the normal sterile fashion. 1% Lidocaine was used for local anesthesia. Under ultrasound guidance a 6 Fr Safe-T-Centesis catheter was introduced. Thoracentesis was performed. The catheter was removed and a dressing applied. FINDINGS: A total of approximately 850 mL of clear yellow pleural fluid was removed. Samples were sent to the laboratory as requested by the clinical team. IMPRESSION: Successful ultrasound guided left thoracentesis yielding 850 mL of pleural fluid. Electronically Signed   By:  Malachy MoanHeath  McCullough M.D.   On: 07/03/2017 15:22    Scheduled Meds: . albuterol  2.5 mg Nebulization Q6H  . aspirin EC  81 mg Oral Daily  . budesonide (PULMICORT) nebulizer solution  0.5 mg Nebulization BID  . cyanocobalamin  1,000 mcg Intramuscular Q30 days  . docusate sodium  100 mg Oral BID  . gabapentin  600 mg Oral QID  . heparin  5,000 Units Subcutaneous Q8H  . hydrocortisone-pramoxine  1 applicator Rectal BID  . iron polysaccharides  150 mg Oral Daily  . isosorbide mononitrate  60 mg Oral Daily  . loratadine  10 mg Oral Daily  . metoprolol tartrate  12.5 mg Oral BID  . nystatin   Topical BID  . pantoprazole  40 mg Oral Daily  . PHENObarbital  60 mg Oral QHS  . phenytoin  100 mg Oral TID   Continuous Infusions: . sodium chloride 50 mL/hr at 07/03/17 1344  . azithromycin Stopped (07/03/17 1810)  . piperacillin-tazobactam (ZOSYN)  IV Stopped (07/11/2017  0945)    Assessment/Plan:  1. Clinical sepsis. Leukocytosis, tachycardia. Could be aspiration pneumonia.  Urine also a strange color. Unfortunately urine culture not sent off on presentation. Continue Zosyn and continue Zithromax at this point. 2. Acute hypoxic respiratory failure. Oxygen tapered to half liter at this point. 3. Tachycardia. Continue IV fluids and metoprolol. 4. Lactic acidosis. IV fluid hydration. Check lactic acid again tomorrow 5. Moderate to large left pleural effusion. Thoracentesis drew off 815 mL 6. Wheeze. Continue nebulizer treatment with budesone nebulizers. Hold off on systemic steroids since we disease is better today. 7. Seizure disorder continue seizure medications 8. Elevation of BNP.  Results of echocardiogram still pending 9. Appreciate speech evaluation  Code Status:     Code Status Orders        Start     Ordered   07/03/17 0022  Full code  Continuous     07/03/17 0021    Code Status History    Date Active Date Inactive Code Status Order ID Comments User Context   04/29/2017  9:13 PM 05/09/2017  7:07 PM Full Code [id number]  Henrene DodgePiscoya, Jose, MD ED   03/31/2017 11:16 PM 04/02/2017  8:03 PM Full Code [id number]  Oralia ManisWillis, David, MD Inpatient   11/26/2016  3:02 PM 11/28/2016  6:18 PM Full Code [id number]  Milagros LollSudini, Srikar, MD ED   05/06/2015 11:52 PM 05/09/2015  8:04 PM Full Code [id number]  Oralia ManisWillis, David, MD Inpatient     Family Communication: sister on phone Disposition Plan: to be determined  Antibiotics:  Zosyn  Zithromax  Time spent: 26 minutes  Alford HighlandWIETING, Elis Rawlinson  [employer]

## 2017-07-18 NOTE — ED Provider Notes (Signed)
[facility]  Department of Emergency Medicine   Code Blue CONSULT NOTE  Chief Complaint: Cardiac arrest/unresponsive   Level V Caveat: Unresponsive  History of present illness: I was contacted by the hospital for a CODE BLUE cardiac arrest upstairs and presented to the patient's bedside.  patient was in asystole, and unresponsive being bagged and CPR ACLS was in progress. Per nurse report which I was unable to verify given acuity, patient's potassium was normal and glucose was not known to be elevated.  ROS: Unable to obtain, Level V caveat  Scheduled Meds: . albuterol  2.5 mg Nebulization Q6H  . aspirin EC  81 mg Oral Daily  . budesonide (PULMICORT) nebulizer solution  0.5 mg Nebulization BID  . cyanocobalamin  1,000 mcg Intramuscular Q30 days  . docusate sodium  100 mg Oral BID  . gabapentin  600 mg Oral QID  . heparin  5,000 Units Subcutaneous Q8H  . hydrocortisone-pramoxine  1 applicator Rectal BID  . iron polysaccharides  150 mg Oral Daily  . isosorbide mononitrate  60 mg Oral Daily  . loratadine  10 mg Oral Daily  . metoprolol tartrate  12.5 mg Oral BID  . nystatin   Topical BID  . pantoprazole  40 mg Oral Daily  . PHENObarbital  60 mg Oral QHS  . phenytoin  100 mg Oral TID   Continuous Infusions: . sodium chloride 50 mL/hr at 07/03/17 1344  . azithromycin Stopped (06/23/17 1858)  . piperacillin-tazobactam (ZOSYN)  IV Stopped (06/23/17 0945)   PRN Meds:.acetaminophen **OR** acetaminophen, bisacodyl, guaifenesin, HYDROcodone-acetaminophen, hydrocortisone, ketorolac, loperamide, magnesium hydroxide, nitroGLYCERIN, ondansetron **OR** ondansetron (ZOFRAN) IV, senna-docusate, traMADol Past Medical History:  Diagnosis Date  . CAD (coronary artery disease)   . GERD (gastroesophageal reflux disease)   . History of hiatal hernia   . HTN (hypertension)   . Mental retardation   . Osteoarthritis   . Pneumococcus infection   . Seasonal allergies   . Seizure  (HCC)   . Seizure [facility](HCC)    Past Surgical History:  Procedure Laterality Date  . CHOLECYSTECTOMY    . LAPAROTOMY N/A 04/29/2017   Procedure: EXPLORATORY LAPAROTOMY, lysis of adhesions;  Surgeon: Henrene DodgePiscoya, Jose, MD;  Location: [facility] ORS;  Service: General;  Laterality: N/A;   Social History   Social History  . Marital status: Single    Spouse name: N/A  . Number of children: N/A  . Years of education: N/A   Occupational History  . Not on file.   Social History Main Topics  . Smoking status: Never Smoker  . Smokeless tobacco: Never Used  . Alcohol use No  . Drug use: No  . Sexual activity: No   Other Topics Concern  . Not on file   Social History Narrative  . No narrative on file   Allergies  Allergen Reactions  . Depakote [Valproic Acid]   . Divalproex Sodium Other (See Comments)    Last set of Vital Signs (not current) Vitals:   06/23/17 1120 06/23/17 1350  BP: 104/81   Pulse: (!) 127   Resp: 20   Temp:    SpO2: 92% 94%      Physical Exam  Gen: unresponsive Cardiovascular: pulseless  Resp: apneic. Breath sounds equal bilaterally with bagging  Abd: nondistended  Neuro: GCS 3, unresponsive to pain  HEENT: No blood in posterior pharynx, gag reflex absent  Neck: No crepitus  Musculoskeletal: No deformity  Skin: warm  Procedures  INTUBATION Performed by: Jeanmarie PlantJAMES A Christyanna Mckeon Required  items: required blood products, implants, devices, and special equipment available Patient identity confirmed: provided demographic data and hospital-assigned identification number Time out: Immediately prior to procedure a "time out" was called to verify the correct patient, procedure, equipment, support staff and site/side marked as required. Indications: cardiac pulmonary arrest Intubation method: direct laryngoscopy with a curved blade Preoxygenation: positive 100%BVM Sedative: na Paralytic: na  Tube Size: 7.5  cuffed Post-procedure assessment: chest rise and ETCO2  monitor Breath sounds: equal and absent over the epigastrium Tube secured by Respiratory Therapy Patient tolerated the procedure well with no immediate complications.patient seemed to be initially somewhat deep on auscultation brought it back a few centimeters she had positive symmetric bilateral breath sounds  CRITICAL CARE Performed by: Jeanmarie Plant Total critical care time: 35 Critical care time was exclusive of separately billable procedures and treating other patients. Critical care was necessary to treat or prevent imminent or life-threatening deterioration. Critical care was time spent personally by me on the following activities: development of treatment plan with patient and/or surrogate as well as nursing, discussions with consultants, evaluation of patient's response to treatment, examination of patient, obtaining history from patient or surrogate, ordering and performing treatments and interventions, ordering and review of laboratory studies, ordering and review of radiographic studies, pulse oximetry and re-evaluation of patient's condition.  Cardiopulmonary Resuscitation (CPR) Procedure Note we did multiple rounds of epinephrine, as well as atropine, bicarbonate and we did give empiric glucose before checking her sugar, we also did continual CPR and followed ACLS protocols patient was either in PEA or very bradycardia low amplitude wave form at 20 approximately with no pulses, at no time did she have any pulses Directed/Performed by: Jeanmarie Plant I personally directed ancillary staff and/or performed CPR in an effort to regain return of spontaneous circulation and to maintain cardiac, neuro and systemic perfusion.    Medical Decision making  patient had been down possibly for up to 20 minutes prior to being seen by nursing staff, at least last known well was 20 minutes prior to arrival. Therefore the time of onset of cardiopulmonary arrest was unclear despite extensive CPR and  ACLS intervention, patient had no retain of spontaneous circulation. Intubation was successful with one pass and still there was no evidence of return of spontaneous circulation. After approximately 20-25 minutes, without pulses, or an organizedpossibly perfusingrhythm on the monitor,, medical futility was decided upon in conjunction with the hospitalist who is at bedside. this was 20-25 minutes of CPR, down time was possibly significantly longer..  Assessment and Plan  patient was in cardiac arrest we were unable to get her back    Jeanmarie Plant, MD 06/28/2017 2345

## 2017-07-18 NOTE — Progress Notes (Signed)
  Echocardiogram 2D Echocardiogram has been performed.  Leta JunglingCooper, Omah Dewalt M 06/20/2017, 2:48 PM

## 2017-07-18 NOTE — Progress Notes (Signed)
Chaplain responded to code blue for pt in [id number]. [facility] Medical team tried to resuscitate pt, but unfortunately pt expired. No family member was available at the time. Dc contact sister and she stated she was on her way. CH provided emotional support to pt's nurse and the care givers who attended to the pt.    Jan 31, 2017 2100  Clinical Encounter Type  Visited With Patient;Health care provider  Visit Type Initial;Code;Death  Referral From Nurse  Consult/Referral To Nurse  Spiritual Encounters  Spiritual Needs Prayer;Emotional;Grief support

## 2017-07-18 NOTE — Progress Notes (Signed)
RN in to room. Pt's IV beeping. Pt laying semi-fowlers in bed. Color dusky. Eyes semi open. Pt doesn't respond to touch or voice. Thready faint carotid pulse palpated. Pt apneic. CPR started. Code called.

## 2017-07-18 NOTE — Progress Notes (Signed)
Code blue is called.19.16.pt was found apnoeic,and pulseless.received  5 doses of epi and 2 doses atropine,1 bicarb,one d50.ACLS protocol followed and intubated.pt did not survive after 20 min in to code.I called her sister and explained about this. She is coming to see the patient.

## 2017-07-18 NOTE — Progress Notes (Signed)
Nurse paged [facility] to inform him that the family of the deceased had arrived. CH met with family in the deceased in [id number]. Family grieving, talked about the life of the deceased and the things she loved and did for others. Family in tears as they talked about the loss. CH provided comfort, emotional support, prayer and pastoral presence.    07-25-2017 2200  Clinical Encounter Type  Visited With Patient and family together  Visit Type Follow-up  Referral From Nurse;Other (Comment)  Consult/Referral To Chaplain  Spiritual Encounters  Spiritual Needs Grief support;Other (Comment)

## 2017-07-18 NOTE — Progress Notes (Signed)
Rept to Dr. Renae GlossWieting. Pt BP 104/81 pulse 127. Pt heart rate fluctuates between 110-127. Dr. Renae GlossWieting in to see pt. Pt exhibits no s/sx distress and no c/o such. Pt received her Imdur and Toprol at approx 0916 AM. Dr. Renae GlossWieting aware pt urine turbid orange. Will continue to monitor. No new orders at this time.

## 2017-07-18 NOTE — Progress Notes (Signed)
Rept to Dr. Renae GlossWieting. Pt BP 104/81 pulse 127 92% 0.5 LPM. Dr. Renae GlossWieting in to see pt. MD aware that pt's urine is orange and very cloudy. Pt received her Imdur and Toprol doses at 0916 AM. No s/x distress and no c/o such. Heart rate rapid but regular on auscultation. No new orders at this time. Will continue to monitor.

## 2017-07-18 DEATH — deceased

## 2017-11-12 ENCOUNTER — Encounter: Payer: Medicare Other | Admitting: Obstetrics and Gynecology

## 2018-03-11 IMAGING — DX DG ABDOMEN 1V
1 series · 1 of 1 positions shown · non-contrast
Comparison: CT of the abdomen and pelvis performed 04/29/2017

CLINICAL DATA: Nasogastric tube placement.  Initial encounter.

EXAM:
ABDOMEN - 1 VIEW

[abdomen kub]
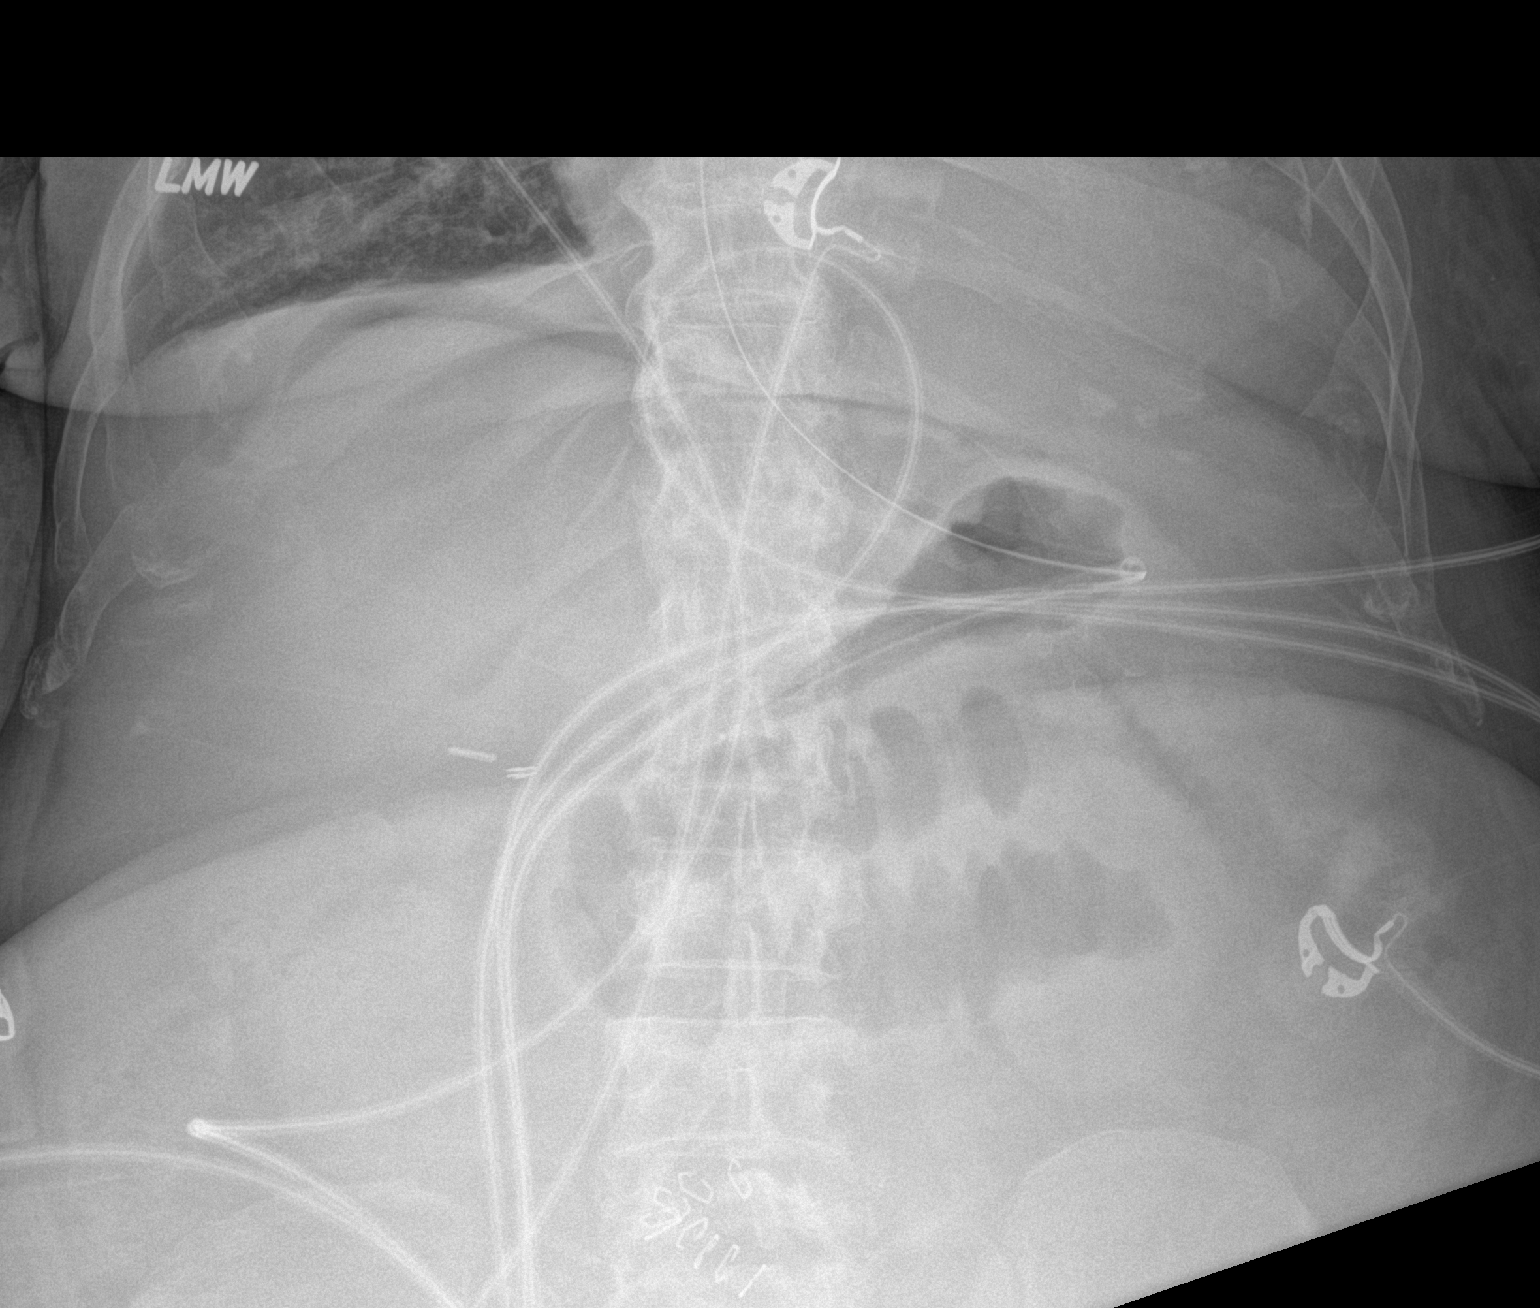

[1 of 1 positions shown; findings below may reference images not displayed]

FINDINGS: The patient's enteric tube is noted ending overlying the antrum of
the stomach.

The visualized bowel gas pattern is grossly unremarkable. No acute
osseous abnormalities are seen. Clips are noted within the right
upper quadrant, reflecting prior cholecystectomy.
IMPRESSION: Enteric tube noted ending overlying the antrum of the stomach.

## 2018-03-14 IMAGING — DX DG ABDOMEN 1V
1 series · 1 of 1 positions shown · non-contrast
Comparison: Abdominal radiograph May 01, 2017

CLINICAL DATA: Follow-up ileus.

EXAM:
ABDOMEN - 1 VIEW

[abdomen kub]
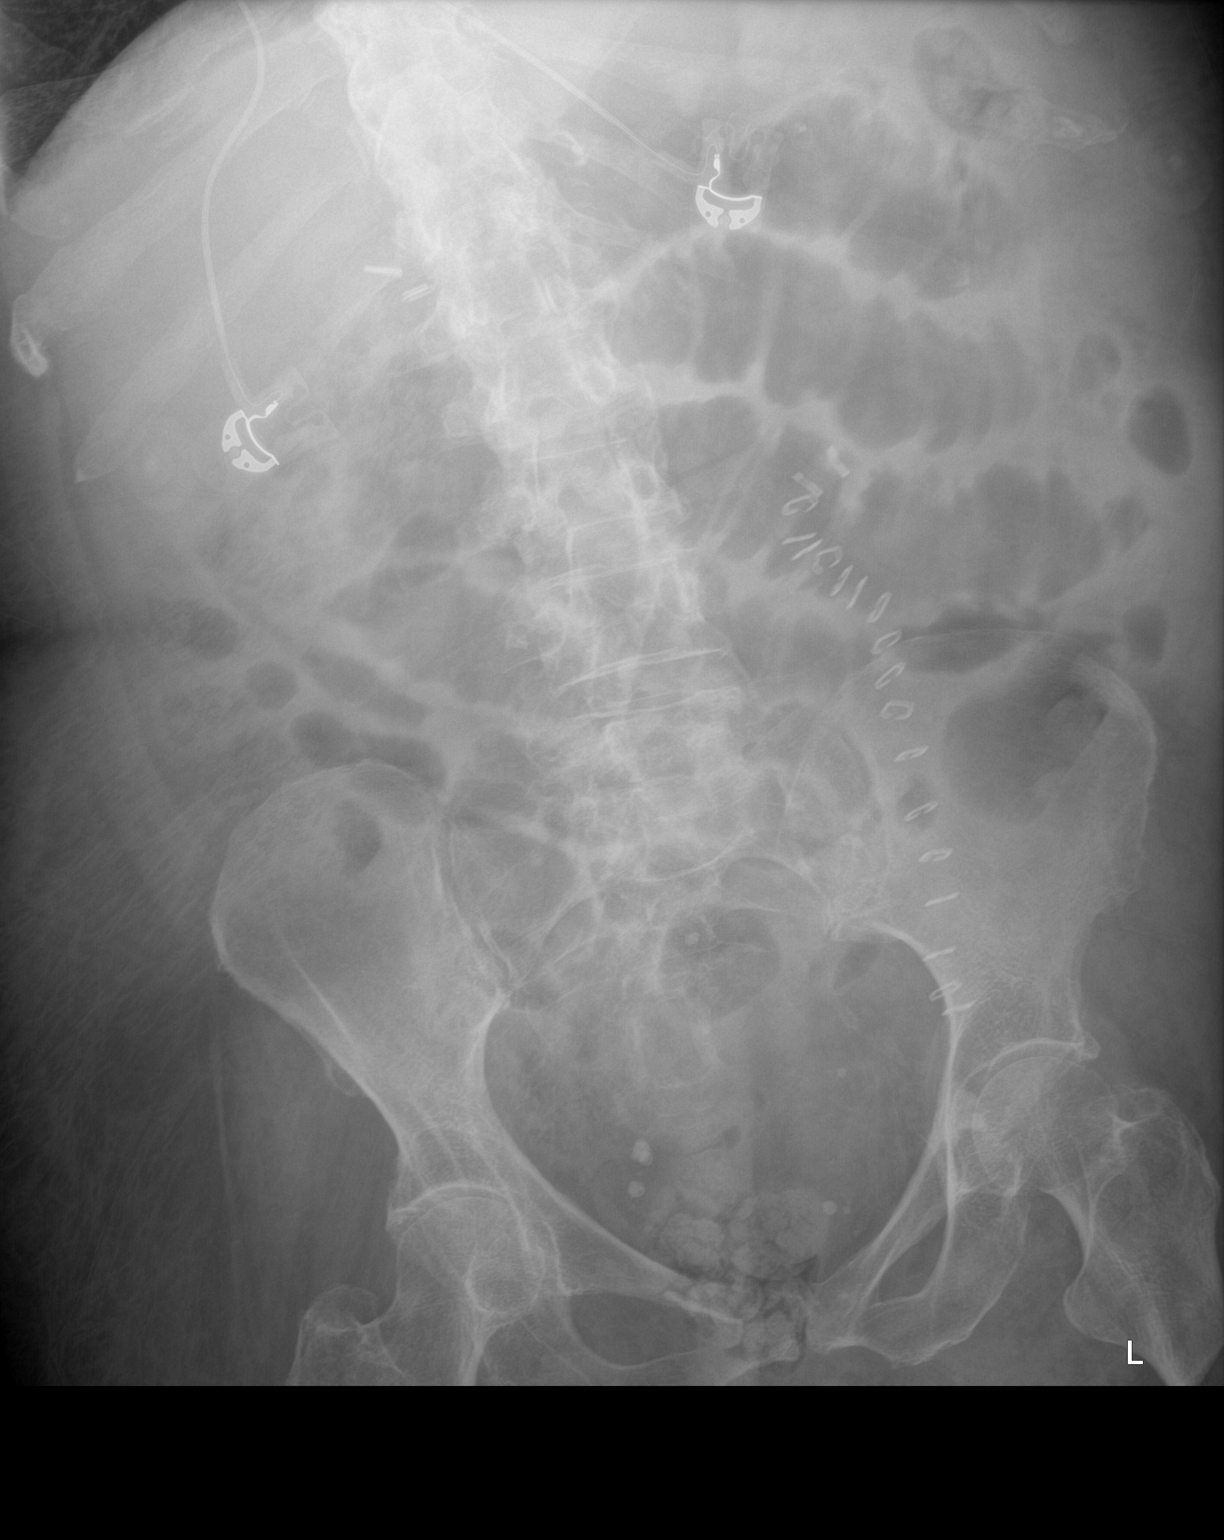

[1 of 1 positions shown; findings below may reference images not displayed]

FINDINGS: Multiple loops of dilated small bowel to at least 3.6 cm. Paucity of
large bowel gas. Interval removal of nasogastric tube. Surgical
clips in the included right abdomen compatible with cholecystectomy.
Phleboliths in the pelvis. No mass-effect. Laparotomy staples.
Osseous structures are nonsuspicious.
IMPRESSION: Dilated small bowel concerning for earlier/ partial small bowel
obstruction, less likely ileus given paucity of large bowel gas.

## 2018-03-15 IMAGING — DX DG ABDOMEN 1V
2 series · 2 of 2 positions shown · non-contrast
Comparison: May 04, 2017

CLINICAL DATA: Ileus.  Follow-up.

EXAM:
ABDOMEN - 1 VIEW

[abdomen kub (1 of 2)]
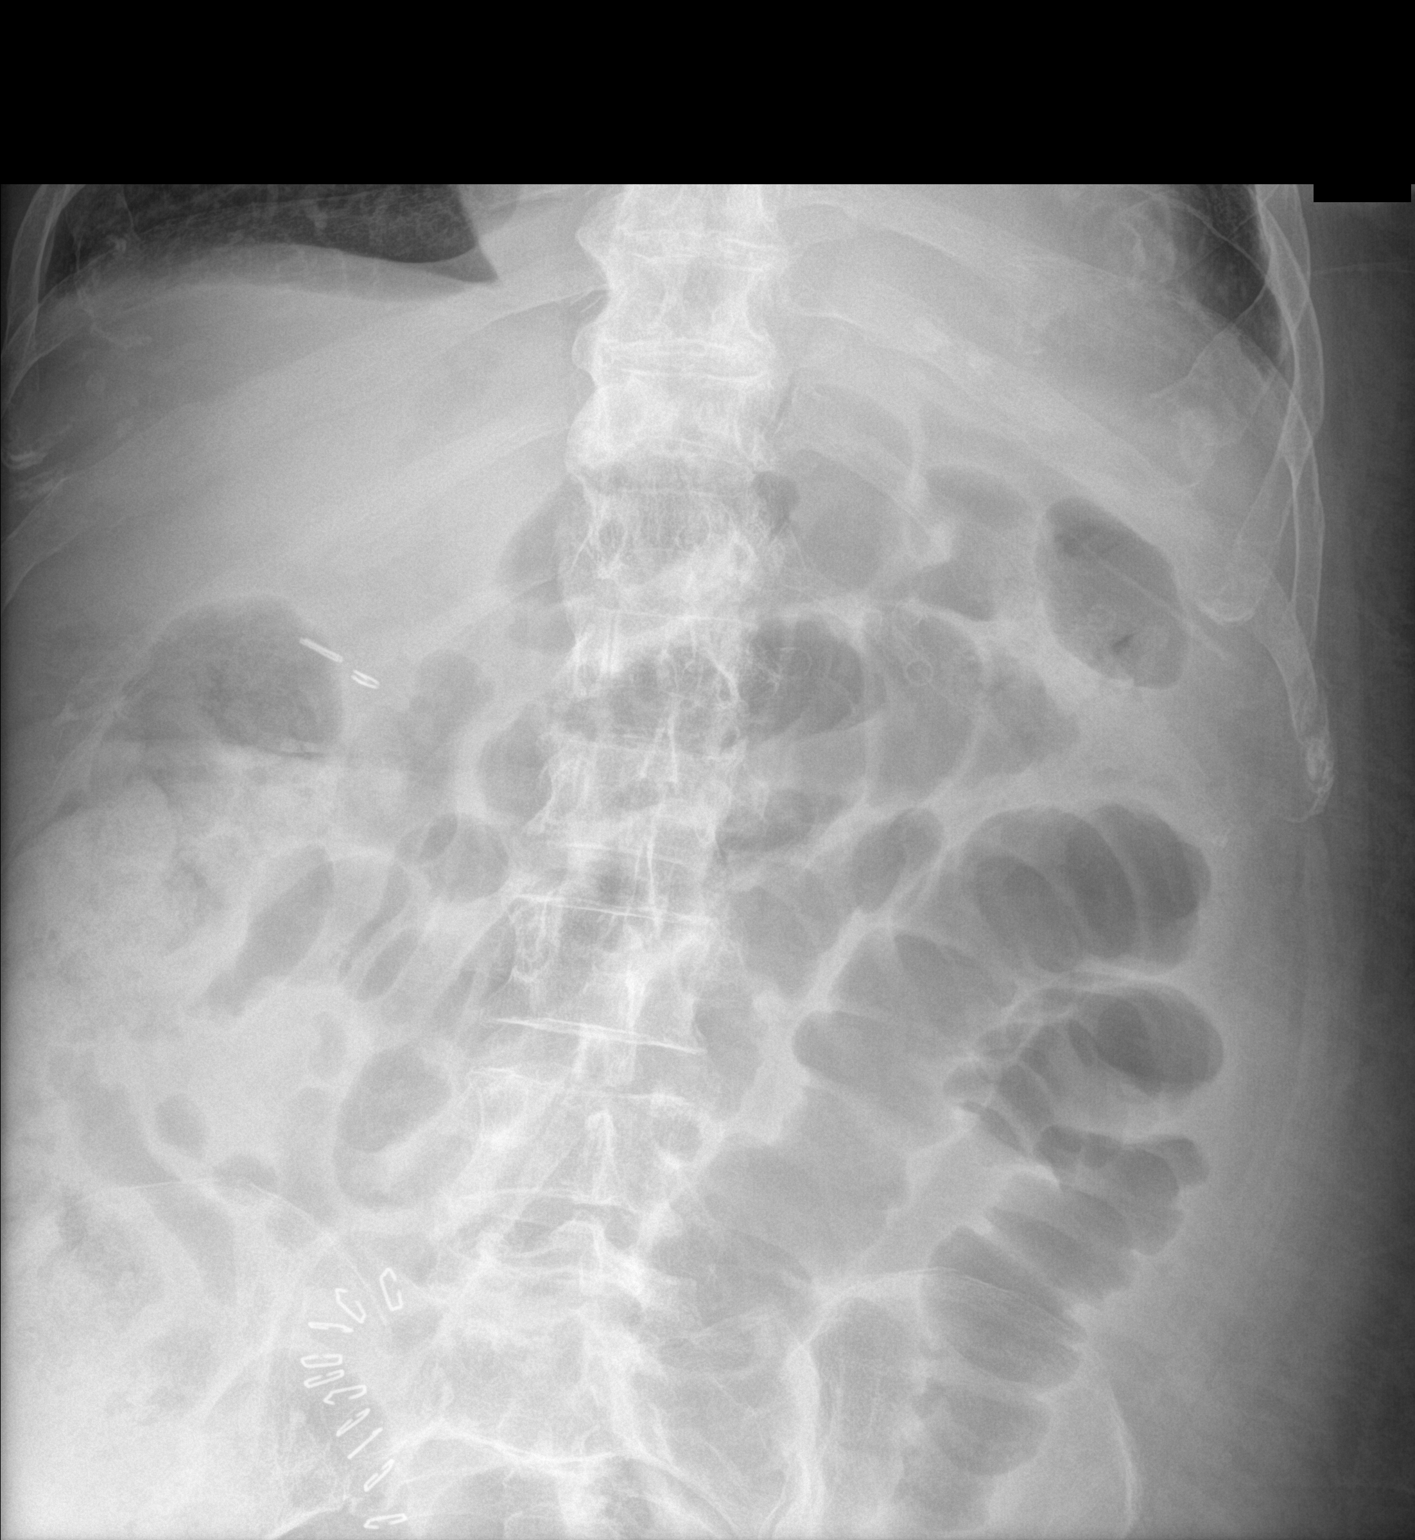

[abdomen kub (2 of 2)]
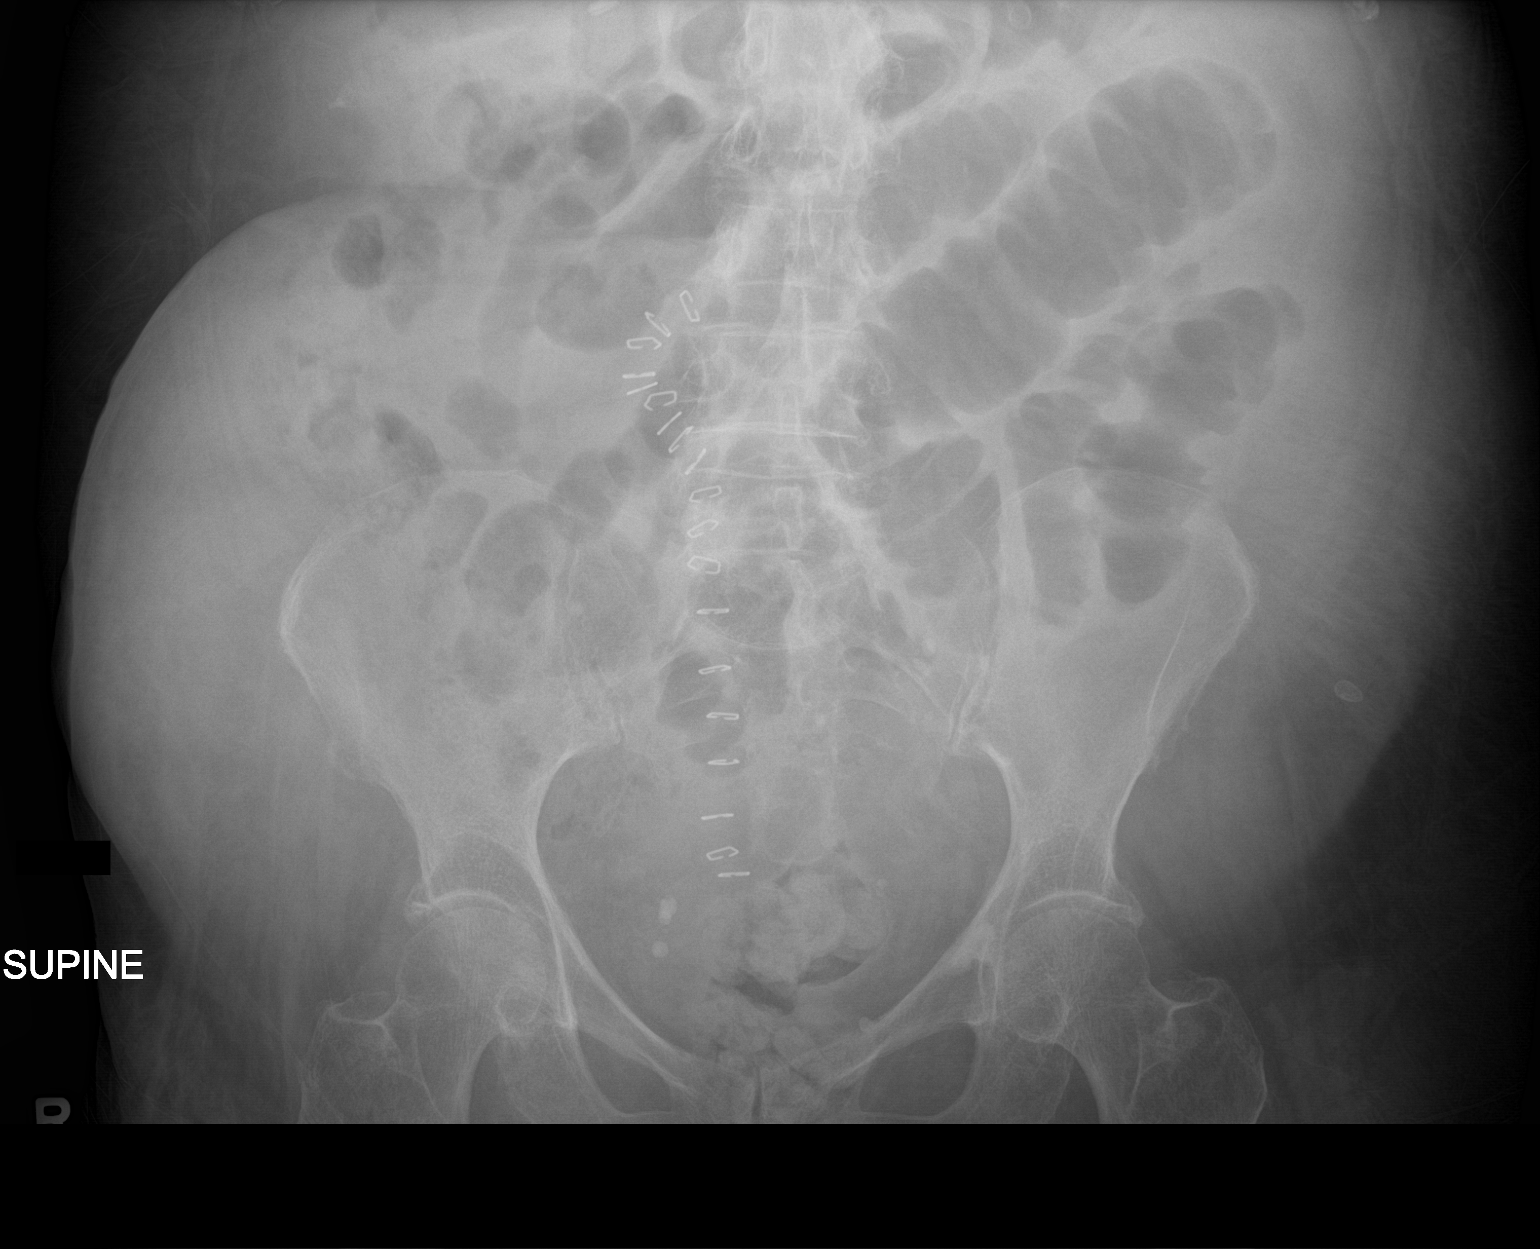

[2 of 2 positions shown; findings below may reference images not displayed]

FINDINGS: Air-filled mildly dilated loops of small bowel are stable in the
interval. There is a paucity of colonic gas. Distal small bowel
loops appear more normal in caliber. Skin staples are identified. No
other acute interval changes.
IMPRESSION: Mild small bowel dilatation with decompression of the colon is
suggestive of an early or mild small bowel obstruction. No
significant oval change.

## 2019-08-08 IMAGING — US US EXTREM  UP VENOUS*R*
1 series · 13 of 24 positions shown · non-contrast
Comparison: None.

CLINICAL DATA: Right upper extremity swelling.



[Series 1: us extrem up venous*right* · 0.08mm/px · 13 of 34 slices shown]
[im 1/34]
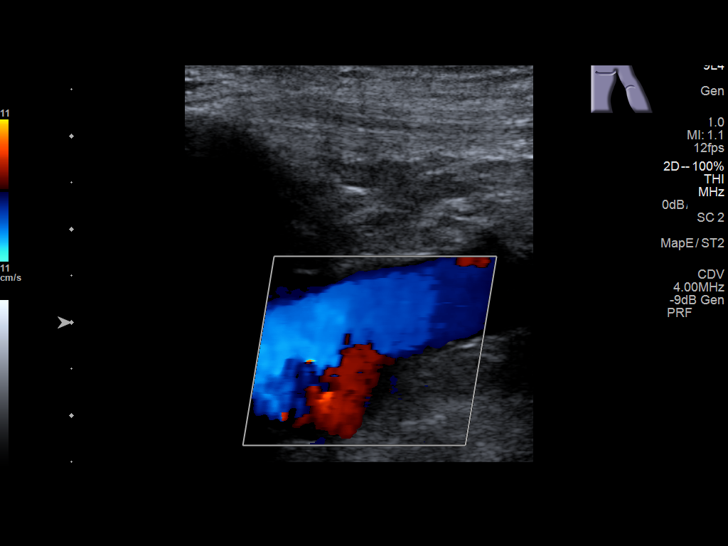
[im 3/34]
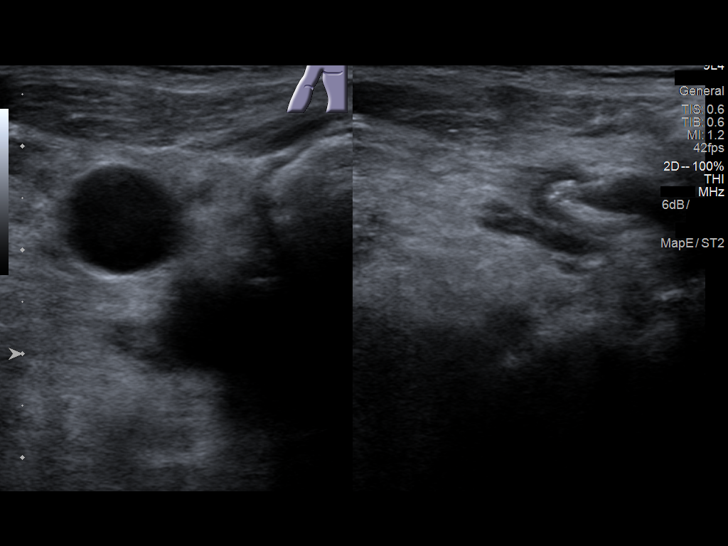
[im 6/34]
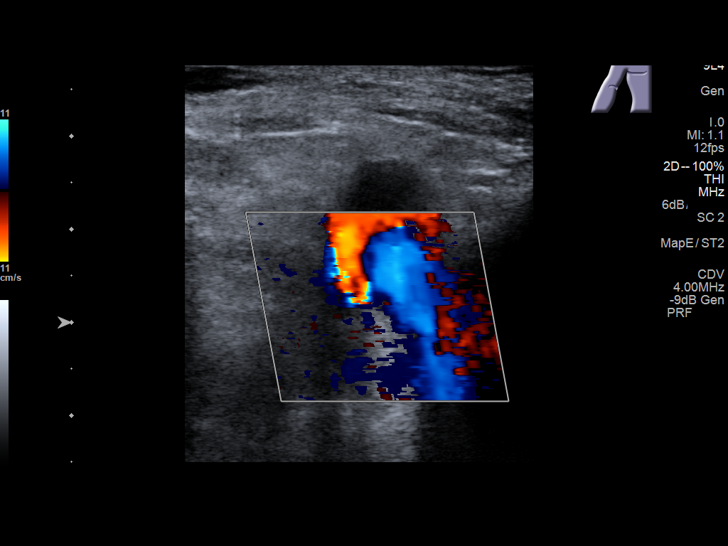
[im 9/34]
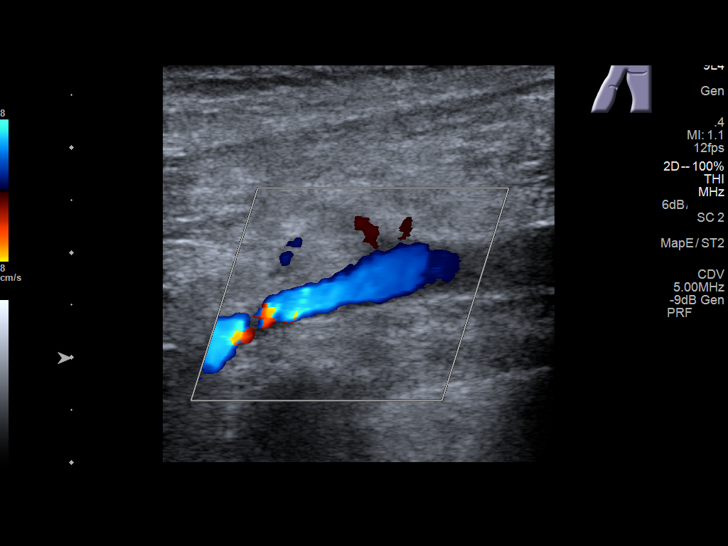
[im 12/34]
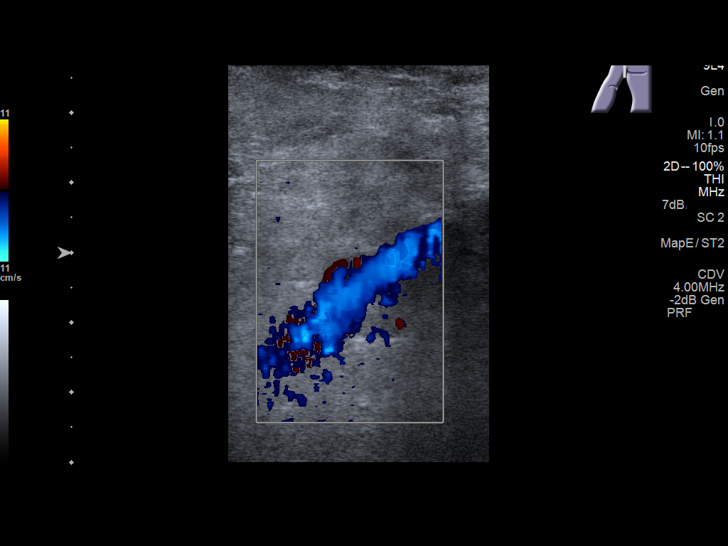
[im 15/34]
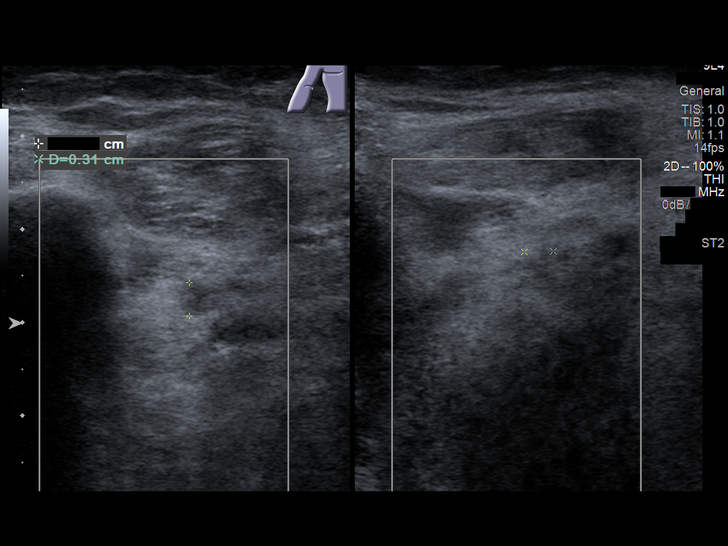
[im 18/34]
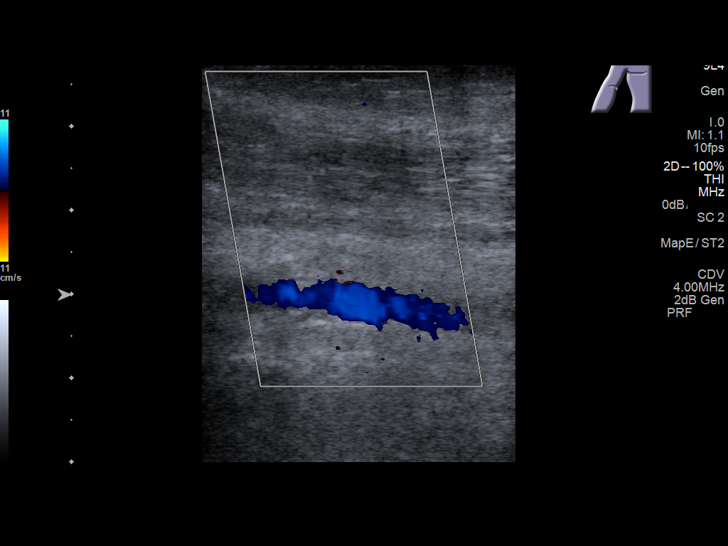
[im 19/34]
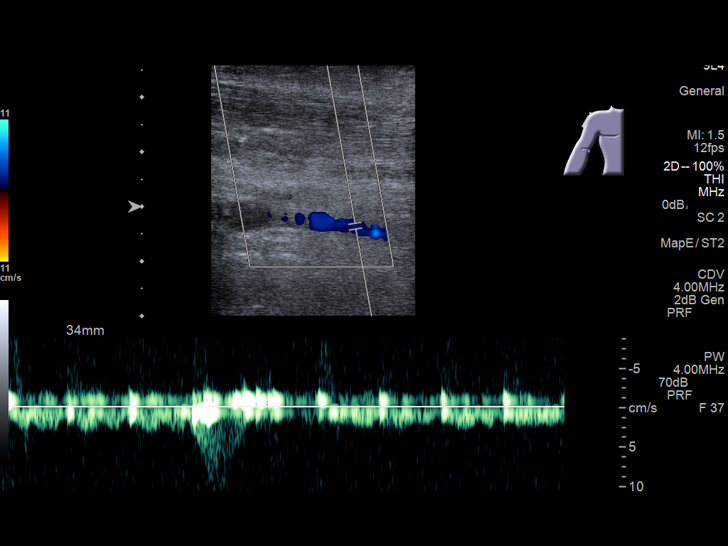
[im 22/34]
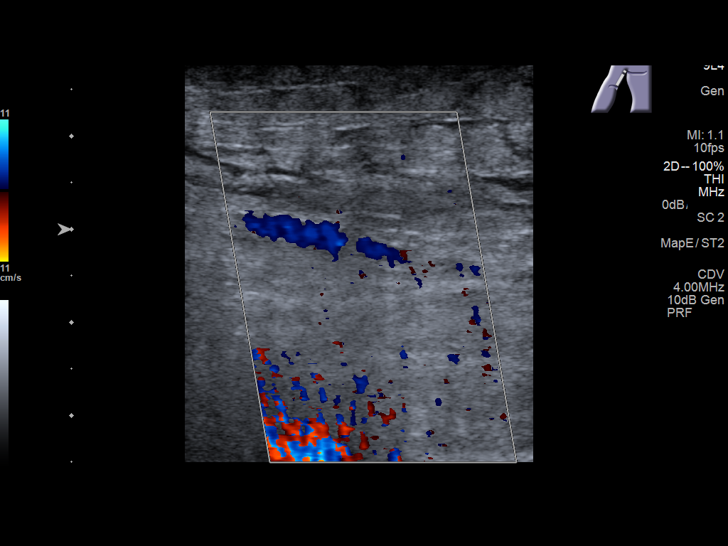
[im 25/34]
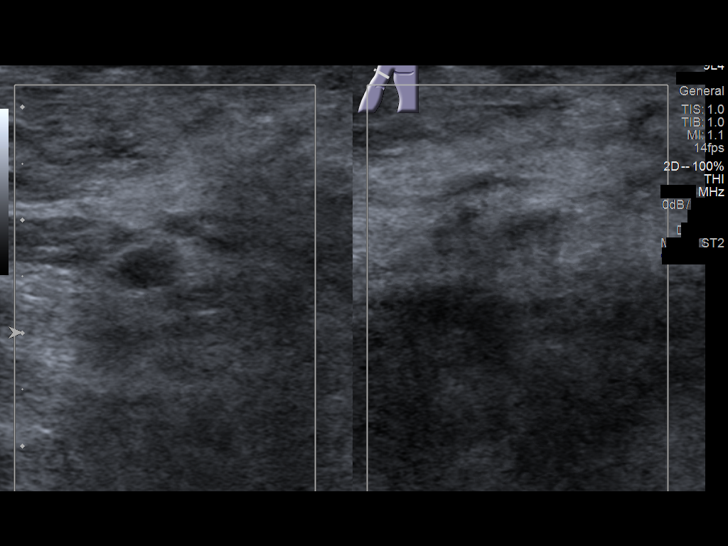
[im 28/34]
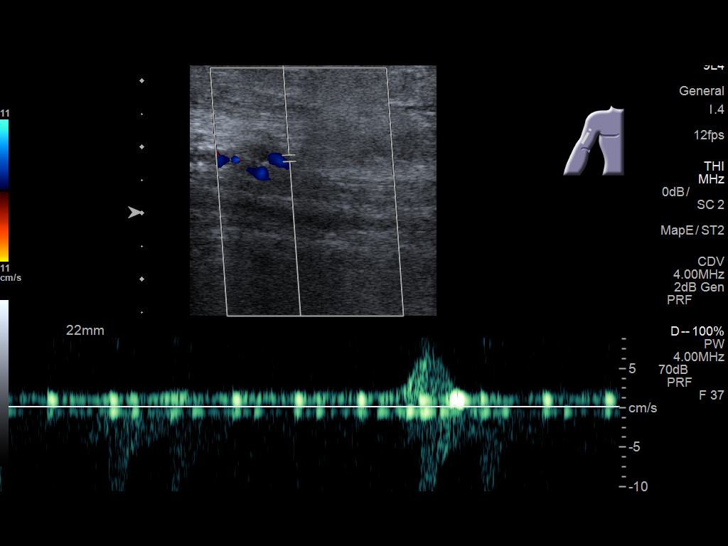
[im 31/34]
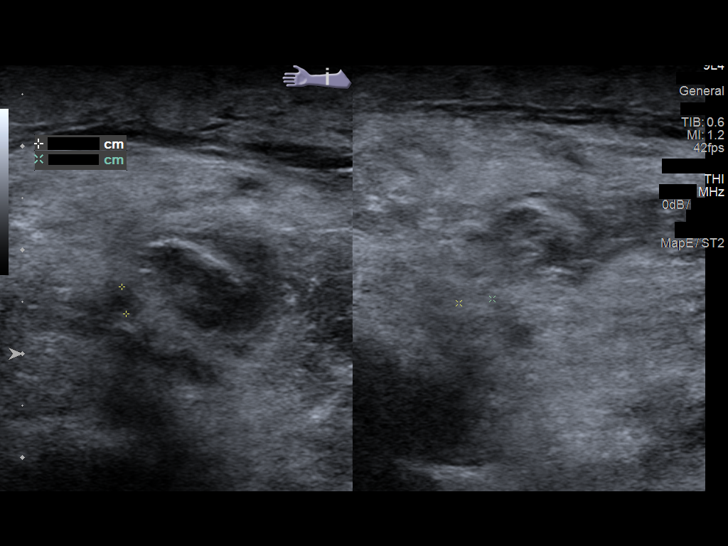
[im 34/34]
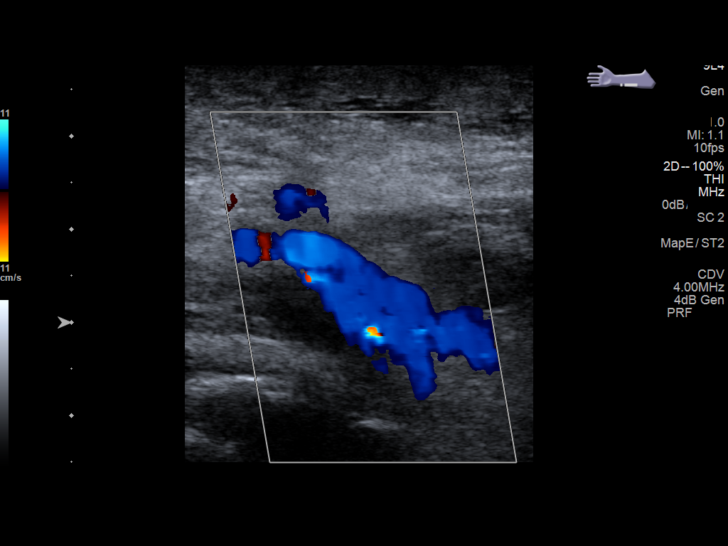

[13 of 24 positions shown; findings below may reference images not displayed]

FINDINGS: Contralateral Subclavian Vein: Respiratory phasicity is normal and
symmetric with the symptomatic side. No evidence of thrombus. Normal
compressibility.

Internal Jugular Vein: No evidence of thrombus. Normal
compressibility, respiratory phasicity and response to augmentation.

Subclavian Vein: No evidence of thrombus. Normal compressibility,
respiratory phasicity and response to augmentation.

Axillary Vein: No evidence of thrombus. Normal compressibility,
respiratory phasicity and response to augmentation.

Cephalic Vein: No evidence of thrombus. Normal compressibility,
respiratory phasicity and response to augmentation.

Basilic Vein: No evidence of thrombus. Normal compressibility,
respiratory phasicity and response to augmentation.

Brachial Veins: No evidence of thrombus. Normal compressibility,
respiratory phasicity and response to augmentation.

Radial Veins: No evidence of thrombus. Normal compressibility,
respiratory phasicity and response to augmentation.

Ulnar Veins: No evidence of thrombus. Normal compressibility,
respiratory phasicity and response to augmentation.

Venous Reflux:  None visualized.

Other Findings:  None visualized.
IMPRESSION: No evidence of DVT within the right upper extremity.
